# Patient Record
Sex: Male | Born: 1940 | Race: White | Hispanic: No | Smoking: Current every day smoker
Health system: Southern US, Community
[De-identification: ages and names within clinical notes are randomized; demographics above are authoritative.]

## PROBLEM LIST (undated history)

## (undated) DIAGNOSIS — I10 Essential (primary) hypertension: Secondary | ICD-10-CM

## (undated) DIAGNOSIS — R600 Localized edema: Secondary | ICD-10-CM

## (undated) DIAGNOSIS — K2970 Gastritis, unspecified, without bleeding: Secondary | ICD-10-CM

## (undated) DIAGNOSIS — I714 Abdominal aortic aneurysm, without rupture: Secondary | ICD-10-CM

## (undated) DIAGNOSIS — K219 Gastro-esophageal reflux disease without esophagitis: Secondary | ICD-10-CM

## (undated) DIAGNOSIS — E8881 Metabolic syndrome: Secondary | ICD-10-CM

## (undated) DIAGNOSIS — R51 Headache: Secondary | ICD-10-CM

## (undated) DIAGNOSIS — K922 Gastrointestinal hemorrhage, unspecified: Secondary | ICD-10-CM

## (undated) DIAGNOSIS — M159 Polyosteoarthritis, unspecified: Secondary | ICD-10-CM

## (undated) DIAGNOSIS — I82409 Acute embolism and thrombosis of unspecified deep veins of unspecified lower extremity: Secondary | ICD-10-CM

## (undated) DIAGNOSIS — J309 Allergic rhinitis, unspecified: Secondary | ICD-10-CM

## (undated) DIAGNOSIS — E7800 Pure hypercholesterolemia, unspecified: Secondary | ICD-10-CM

## (undated) DIAGNOSIS — K279 Peptic ulcer, site unspecified, unspecified as acute or chronic, without hemorrhage or perforation: Secondary | ICD-10-CM

## (undated) DIAGNOSIS — R0602 Shortness of breath: Secondary | ICD-10-CM

## (undated) DIAGNOSIS — M1990 Unspecified osteoarthritis, unspecified site: Secondary | ICD-10-CM

## (undated) DIAGNOSIS — M109 Gout, unspecified: Secondary | ICD-10-CM

## (undated) HISTORY — PX: FOOT SURGERY: SHX648

## (undated) HISTORY — DX: Pure hypercholesterolemia, unspecified: E78.00

## (undated) HISTORY — PX: APPENDECTOMY: SHX54

## (undated) HISTORY — PX: ABDOMINAL AORTIC ANEURYSM REPAIR: SUR1152

## (undated) HISTORY — DX: Gastrointestinal hemorrhage, unspecified: K92.2

## (undated) HISTORY — DX: Gout, unspecified: M10.9

## (undated) HISTORY — DX: Metabolic syndrome: E88.810

## (undated) HISTORY — DX: Acute embolism and thrombosis of unspecified deep veins of unspecified lower extremity: I82.409

## (undated) HISTORY — DX: Localized edema: R60.0

## (undated) HISTORY — DX: Gastritis, unspecified, without bleeding: K29.70

## (undated) HISTORY — DX: Allergic rhinitis, unspecified: J30.9

## (undated) HISTORY — DX: Metabolic syndrome: E88.81

## (undated) HISTORY — DX: Polyosteoarthritis, unspecified: M15.9

## (undated) HISTORY — PX: OTHER SURGICAL HISTORY: SHX169

## (undated) HISTORY — DX: Peptic ulcer, site unspecified, unspecified as acute or chronic, without hemorrhage or perforation: K27.9

## (undated) HISTORY — PX: TONSILLECTOMY: SUR1361

## (undated) HISTORY — PX: KNEE ARTHROSCOPY: SHX127

## (undated) NOTE — *Deleted (*Deleted)
Family Medicine Teaching Service Daily Progress Note Intern Pager: [phone]  Patient name: Richard Townsend Medical record number: [id number] Date of birth: [birth year] Age: 70 y.o. Gender: male  Primary Care Provider: Philemon Kingdom, MD Consultants: Cardiology, vascular surgery, wound care Code Status: DNR  Pt Overview and Major Events to Date:  Admitted 04/30/2020  Assessment and Plan: Richard Townsend a 67 y.o.malepresenting with bilateral calf and foot swelling. PMH is significant forHFrEF, HTN, CAD, MI, AAA s/p repair, GI bleed, DVT s/p IVC filter 04/2019, hyperlipidemia.   Subacute bilateral lower extremity edema *** . Echo yesterday 11/22 showed EF stable from last echo 30 to 35%, tricuspid and mitral regurg. ABI study 11/22 showed moderate bilateral extremity arterial disease, ABI left 0.69, right 0.70. Vascular surgery following, pt declined offered interventions yesterday.  -Cardiology recommends gentle diuresis with 40 mg IV Lasix twice daily -Wound care consulted, recommendations in place -Follow-up EVAR duplex from vascular -Strict I's and O's -Daily weights -Follow-up a.m. CMP, CBC  HFrEF *** -Cardiology recommendations as above -Echo as above -Continue Imdur and metoprolol -Will consider nitroglycerin in future if need arises -Strict I's and O's -Daily weights  AKI CKD Stage 2 Creatinine worsening, today 1.88. Since admission 1.65 > 1.77 > 1.88. Believe baseline to be 1.7-1.8. Prior to admission, most recent creatinine 1.77 on 11/17. -Given approaching euvolemia, will reduce diuresis -Follow daily creatinine -Consider consult nephro if creatinine worsens  Thrombocytopenia: new, worsening Worsening. This morning, 115. Since admission, 133 > 122 > 115. Prior to admission, last measured 140 on 06/27/2019. Normal baseline thought to be 350-370.  -Follow-up morning CBC, CMP  CAD  hx MI  hx AAA s/p repair DVT s/p IVC Takes atorvastatin 20 mg  daily at home.Patient previously taken off Eliquis after large GI bleed. Will provide DVT prophylaxis in form of Lovenox,adjusted for creatinine clearance. -Continue atorvastatin -DVT prophylaxis with renally dosed Lovenox  Hx GI bleed GERD  Hx stomach ulcer Patient previously taken off Eliquis after large GI bleed. Will provide DVT prophylaxis in form of Lovenox,adjusted for creatinine clearance.Takes famotidine 40 mg, ferrous sulfate 324 mg, Protonix 40 mg twice daily at home.  -Continue home doses Pepcid, p.o. iron, Protonix.  COPD On Symbicort 2 puffs daily and albuterol as needed at home. [facility] inpatient due to formulary -Continue albuterol inhaler as needed  Hyperlipidemia Takes atorvastatin 20 mg daily at home. -Continue home meds  Gout Takes allopurinol 300 mg daily at home. -Continue home meds  Tobacco use Smokes pack a day. -Provide nicotine patch 7 mg daily (had nausea with patches in past) -Will increase as necessary   FEN/GI:Regular diet Prophylaxis:Lovenox   Status is: Observation  The patient remains OBS appropriate and will d/c before 2 midnights.  Dispo: The patient is from: Home              Anticipated d/c is to: Home              Anticipated d/c date is: 1 day              Patient currently is medically stable to d/c.    Subjective:  ***  Objective: Temp:  [97.9 F (36.6 C)-98.8 F (37.1 C)] 98.8 F (37.1 C) (11/23 0414) Pulse Rate:  [83-90] 87 (11/23 0414) Resp:  [16-17] 17 (11/23 0414) BP: (118-148)/(71-91) 125/90 (11/23 0414) SpO2:  [93 %-100 %] 95 % (11/23 0414) Weight:  [69.5 kg] 69.5 kg (11/23 0454) Physical Exam: General: *** Cardiovascular: *** Respiratory: *** Abdomen: ***  Extremities: ***  Laboratory: Recent Labs  Lab 04/30/20 1222 05/01/20 0435 05/02/20 0314  WBC 11.0* 9.8 9.6  HGB 16.9 15.6 15.0  HCT 51.1 47.1 45.5  PLT 133* 122* 115*   Recent Labs  Lab 04/30/20 2146 04/30/20  2146 05/01/20 0435 05/01/20 1434 05/02/20 0314  NA 136   < > 137 134* 135  K 2.9*   < > 2.9* 3.3* 3.9  CL 92*   < > 93* 92* 93*  CO2 28   < > 30 27 30   BUN 63*   < > 60* 62* 60*  CREATININE 1.65*   < > 1.77* 1.88* 1.88*  CALCIUM 9.3   < > 9.1 8.7* 8.7*  PROT 6.3*  --  6.1*  --   --   BILITOT 2.0*  --  2.4*  --   --   ALKPHOS 121  --  120  --   --   ALT 105*  --  103*  --   --   AST 26  --  26  --   --   GLUCOSE 138*   < > 94 175* 116*   < > = values in this interval not displayed.    Imaging/Diagnostic Tests:    Fayette Pho, MD 05/02/2020, 7:13 AM PGY-1, [facility] Family Medicine FPTS Intern pager: [phone], text pages welcome

---

## 2007-06-11 HISTORY — PX: EYE SURGERY: SHX253

## 2007-06-11 HISTORY — PX: OTHER SURGICAL HISTORY: SHX169

## 2011-11-09 DIAGNOSIS — I714 Abdominal aortic aneurysm, without rupture, unspecified: Secondary | ICD-10-CM

## 2011-11-09 HISTORY — DX: Abdominal aortic aneurysm, without rupture: I71.4

## 2011-11-09 HISTORY — DX: Abdominal aortic aneurysm, without rupture, unspecified: I71.40

## 2011-11-27 ENCOUNTER — Encounter (HOSPITAL_COMMUNITY): Payer: Self-pay | Admitting: General Practice

## 2011-11-27 ENCOUNTER — Inpatient Hospital Stay (HOSPITAL_COMMUNITY)
Admission: RE | Admit: 2011-11-27 | Discharge: 2011-11-29 | DRG: 238 | Disposition: A | Discharge status: Home / Self Care | Payer: Medicare PPO | Source: Other Acute Inpatient Hospital | Attending: Surgery | Admitting: Surgery

## 2011-11-27 ENCOUNTER — Encounter (HOSPITAL_COMMUNITY): Payer: Self-pay | Admitting: Anesthesiology

## 2011-11-27 DIAGNOSIS — I714 Abdominal aortic aneurysm, without rupture, unspecified: Secondary | ICD-10-CM

## 2011-11-27 DIAGNOSIS — K219 Gastro-esophageal reflux disease without esophagitis: Secondary | ICD-10-CM | POA: Diagnosis present

## 2011-11-27 DIAGNOSIS — I10 Essential (primary) hypertension: Secondary | ICD-10-CM | POA: Diagnosis present

## 2011-11-27 DIAGNOSIS — F17200 Nicotine dependence, unspecified, uncomplicated: Secondary | ICD-10-CM | POA: Diagnosis present

## 2011-11-27 HISTORY — DX: Gastro-esophageal reflux disease without esophagitis: K21.9

## 2011-11-27 HISTORY — DX: Headache: R51

## 2011-11-27 HISTORY — DX: Abdominal aortic aneurysm, without rupture: I71.4

## 2011-11-27 HISTORY — DX: Essential (primary) hypertension: I10

## 2011-11-27 HISTORY — DX: Unspecified osteoarthritis, unspecified site: M19.90

## 2011-11-27 HISTORY — DX: Shortness of breath: R06.02

## 2011-11-27 LAB — COMPREHENSIVE METABOLIC PANEL
ALT: 13 U/L (ref 0–53)
AST: 8 U/L (ref 0–37)
Albumin: 3.6 g/dL (ref 3.5–5.2)
Alkaline Phosphatase: 85 U/L (ref 39–117)
BUN: 13 mg/dL (ref 6–23)
CO2: 26 mEq/L (ref 19–32)
Calcium: 9 mg/dL (ref 8.4–10.5)
Chloride: 102 mEq/L (ref 96–112)
Creatinine, Ser: 0.84 mg/dL (ref 0.50–1.35)
GFR calc Af Amer: >90 mL/min (ref >90)
GFR calc non Af Amer: 87 mL/min — ABNORMAL LOW (ref >90)
Glucose, Bld: 98 mg/dL (ref 70–99)
Potassium: 3.5 mEq/L (ref 3.5–5.1)
Sodium: 138 mEq/L (ref 135–145)
Total Bilirubin: 0.3 mg/dL (ref 0.3–1.2)
Total Protein: 7.5 g/dL (ref 6.0–8.3)

## 2011-11-27 LAB — CBC
HCT: 42.6 % (ref 39.0–52.0)
Hemoglobin: 14.2 g/dL (ref 13.0–17.0)
MCH: 28.2 pg (ref 26.0–34.0)
MCHC: 33.3 g/dL (ref 30.0–36.0)
MCV: 84.5 fL (ref 78.0–100.0)
Platelets: 231 10*3/uL (ref 150–400)
RBC: 5.04 MIL/uL (ref 4.22–5.81)
RDW: 15.4 % (ref 11.5–15.5)
WBC: 13.2 10*3/uL — ABNORMAL HIGH (ref 4.0–10.5)

## 2011-11-27 LAB — ABO/RH: ABO/RH(D): A POS

## 2011-11-27 LAB — URINALYSIS, ROUTINE W REFLEX MICROSCOPIC
Bilirubin Urine: NEGATIVE
Glucose, UA: NEGATIVE mg/dL
Hgb urine dipstick: NEGATIVE
Ketones, ur: NEGATIVE mg/dL
Leukocytes, UA: NEGATIVE
Nitrite: NEGATIVE
Protein, ur: NEGATIVE mg/dL
Specific Gravity, Urine: >1.046 — ABNORMAL HIGH (ref 1.005–1.030)
Urobilinogen, UA: 0.2 mg/dL (ref 0.0–1.0)
pH: 5.5 (ref 5.0–8.0)

## 2011-11-27 LAB — PROTIME-INR
INR: 0.91 (ref 0.00–1.49)
Prothrombin Time: 12.5 seconds (ref 11.6–15.2)

## 2011-11-27 LAB — PREPARE RBC (CROSSMATCH)

## 2011-11-27 MED ORDER — GUAIFENESIN-DM 100-10 MG/5ML PO SYRP: 15.0000 mL | ORAL_SOLUTION | ORAL | Status: DC | PRN

## 2011-11-27 MED ORDER — HYDROMORPHONE HCL PF 1 MG/ML IJ SOLN
INTRAMUSCULAR | Status: AC
  Administered 2011-11-27: 1 mg via INTRAVENOUS
  Filled 2011-11-27: qty 1

## 2011-11-27 MED ORDER — HYDRALAZINE HCL 20 MG/ML IJ SOLN
10.0000 mg | INTRAMUSCULAR | Status: DC | PRN
  Administered 2011-11-28: 10 mg via INTRAVENOUS

## 2011-11-27 MED ORDER — ONDANSETRON HCL 4 MG/2ML IJ SOLN
4.0000 mg | Freq: Four times a day (QID) | INTRAMUSCULAR | Status: DC | PRN
  Administered 2011-11-27: 4 mg via INTRAVENOUS
  Filled 2011-11-27: qty 2

## 2011-11-27 MED ORDER — ALPRAZOLAM 0.5 MG PO TABS: 0.5000 mg | ORAL_TABLET | Freq: Three times a day (TID) | ORAL | Status: DC | PRN

## 2011-11-27 MED ORDER — METOPROLOL TARTRATE 1 MG/ML IV SOLN: 2.0000 mg | INTRAVENOUS | Status: DC | PRN

## 2011-11-27 MED ORDER — POTASSIUM CHLORIDE CRYS ER 20 MEQ PO TBCR: 20.0000 meq | EXTENDED_RELEASE_TABLET | Freq: Once | ORAL | Status: DC

## 2011-11-27 MED ORDER — MORPHINE SULFATE 2 MG/ML IJ SOLN
2.0000 mg | INTRAMUSCULAR | Status: DC | PRN
  Administered 2011-11-27: 4 mg via INTRAVENOUS
  Administered 2011-11-27 (×2): 2 mg via INTRAVENOUS
  Filled 2011-11-27: qty 2
  Filled 2011-11-27: qty 1
  Filled 2011-11-27: qty 2

## 2011-11-27 MED ORDER — PANTOPRAZOLE SODIUM 40 MG PO TBEC: 40.0000 mg | DELAYED_RELEASE_TABLET | Freq: Every day | ORAL | Status: DC

## 2011-11-27 MED ORDER — SODIUM CHLORIDE 0.9 % IV SOLN
INTRAVENOUS | Status: DC
  Administered 2011-11-27 – 2011-11-28 (×3): via INTRAVENOUS

## 2011-11-27 MED ORDER — HYDROMORPHONE HCL PF 1 MG/ML IJ SOLN
1.0000 mg | INTRAMUSCULAR | Status: DC | PRN
  Administered 2011-11-27 – 2011-11-28 (×10): 1 mg via INTRAVENOUS
  Filled 2011-11-27 (×10): qty 1

## 2011-11-27 MED ORDER — IRBESARTAN 300 MG PO TABS
300.0000 mg | ORAL_TABLET | Freq: Every day | ORAL | Status: DC
  Administered 2011-11-27: 300 mg via ORAL
  Filled 2011-11-27 (×2): qty 1

## 2011-11-27 MED ORDER — LABETALOL HCL 5 MG/ML IV SOLN: 10.0000 mg | INTRAVENOUS | Status: DC | PRN

## 2011-11-27 MED ORDER — PHENOL 1.4 % MT LIQD: 1.0000 | OROMUCOSAL | Status: DC | PRN

## 2011-11-27 MED ORDER — ALPRAZOLAM 0.5 MG PO TABS: 0.5000 mg | ORAL_TABLET | Freq: Four times a day (QID) | ORAL | Status: DC | PRN

## 2011-11-27 MED ORDER — CEFAZOLIN SODIUM 1-5 GM-% IV SOLN
1.0000 g | INTRAVENOUS | Status: DC
Start: 2011-11-28 — End: 2011-11-28
  Filled 2011-11-27: qty 50

## 2011-11-27 MED ORDER — ALUM & MAG HYDROXIDE-SIMETH 200-200-20 MG/5ML PO SUSP: 15.0000 mL | ORAL | Status: DC | PRN

## 2011-11-27 NOTE — H&P (Signed)
[facility] [place]      History and Physical  Patient name: Richard Townsend MRN: [id number] DOB: 1940-10-02 Sex: male   Reason for Admission: No chief complaint on file.   HISTORY OF PRESENT ILLNESS: This is a 71 year old gentleman who initially presented to the [facility] emergency department with a four-day history of abdominal pain which radiated to his groin. He was initially treated with pain medication with the thoughts that this was a kidney stone since he has had multiple kidney stones in the past. Last night the pain had not resolved and therefore he went to the emergency department where a CT scan was obtained. The CT scan reveals a 5.8 cm abdominal aortic aneurysm. The patient was therefore transferred to [place] for further management. Upon arrival the patient states that his pain has subsided somewhat.  The patient denies a history of diabetes or hypercholesterolemia. He does have hypertension and has been a long-time smoker. He is status post cholecystectomy and umbilical hernia repair done simultaneously via a laparoscopic approach.    No past medical history on file.  No past surgical history on file.  History   Social History  . Marital Status: N/A    Spouse Name: N/A    Number of Children: N/A  . Years of Education: N/A   Occupational History  . Not on file.   Social History Main Topics  . Smoking status: Not on file  . Smokeless tobacco: Not on file  . Alcohol Use: Not on file  . Drug Use: Not on file  . Sexually Active: Not on file   Other Topics Concern  . Not on file   Social History Narrative  . No narrative on file    No family history on file.  Allergies as of 11/27/2011  . (Not on File)    No current facility-administered medications on file prior to encounter.   No current outpatient prescriptions on file prior to encounter.     REVIEW OF SYSTEMS: Cardiovascular: No chest pain, chest pressure, palpitations,  orthopnea, or dyspnea on exertion. No claudication or rest pain,  No history of DVT or phlebitis. Pulmonary: No productive cough, asthma or wheezing. Neurologic: No weakness, paresthesias, aphasia, or amaurosis. No dizziness. Hematologic: No bleeding problems or clotting disorders. Musculoskeletal: No joint pain or joint swelling. Gastrointestinal: No blood in stool or hematemesis Genitourinary: No dysuria or hematuria. Psychiatric:: No history of major depression. Integumentary: No rashes or ulcers. Constitutional: No fever or chills.  PHYSICAL EXAMINATION: General: The patient appears their stated age.  Vital signs are BP 165/81  Pulse 67  Temp 98.6 F (37 C) (Oral)  Resp 19  Ht 5\' 10"  (1.778 m)  Wt 198 lb 3.1 oz (89.9 kg)  BMI 28.44 kg/m2  SpO2 93% HEENT:  No gross abnormalities Pulmonary: Respirations are non-labored Abdomen: His abdomen is somewhat obese. His pain is in the suprapubic region. Upon deep palpation of his aorta, he has very minimal if no discomfort. Musculoskeletal: There are no major deformities.   Neurologic: No focal weakness or paresthesias are detected, Skin: There are no ulcer or rashes noted. Psychiatric: The patient has normal affect. Cardiovascular: There is a regular rate and rhythm without significant murmur appreciated. Pedal pulses are not palpated. Femoral pulses are palpable.  Diagnostic Studies: I have reviewed his CT angiogram which reveals a 5.8 cm infrarenal abdominal aortic aneurysm which is possibly inflammatory.    Assessment:  Possible symptomatic abdominal aortic aneurysm. Plan: I discussed with  the patient and his family at the bedside the significance of the radiographic findings which revealed a large abdominal aortic aneurysm. I told him that I am not convinced that his pain is coming from his aneurysm but this is the one problem that could potentially be life-threatening and therefore it needs to be addressed. I believe he will be  a candidate for endovascular repair. I discussed the risks and benefits of the procedure which include the risk of death, bleeding, cardiopulmonary complications, intestinal ischemia, lower extremity ischemia, and the potential need for recurrent intervention and long-term surveillance. All of his questions were answered. I will plan on proceeding tomorrow morning. Today I will get baseline studies of his carotid and lower extremities.     Richard Townsend, M.D. [facility] [place] Office: [phone] Pager:  [phone]

## 2011-11-27 NOTE — Progress Notes (Signed)
Pt having continued pain unresolved by 4mg  morphine and very restless, getting up and standing at side of bed.  Della Goo PA notified. New orders received for pain and anxiety medication.    Roselie Awkward, RN

## 2011-11-27 NOTE — Progress Notes (Signed)
Utilization review completed.  

## 2011-11-27 NOTE — Care Management Note (Signed)
    Page 1 of 1   11/29/2011     11:49:43 AM   CARE MANAGEMENT NOTE 11/29/2011  Patient:  Richard Townsend, Richard Townsend   Account Number:  [id number]  Date Initiated:  11/27/2011  Documentation initiated by:  Donn Pierini  Subjective/Objective Assessment:   Pt admitted with AAA- pending OR for repair on Thur.     Action/Plan:   PTA pt lived at home with spouse, independent   Anticipated DC Date:  11/30/2011   Anticipated DC Plan:  HOME/SELF CARE      DC Planning Services  CM consult      Choice offered to / List presented to:             Status of service:  Completed, signed off Medicare Important Message given?   (If response is "NO", the following Medicare IM given date fields will be blank) Date Medicare IM given:   Date Additional Medicare IM given:    Discharge Disposition:  HOME/SELF CARE  Per UR Regulation:  Reviewed for med. necessity/level of care/duration of stay  If discussed at Long Length of Stay Meetings, dates discussed:    Comments:  11/29/11- 1145- Donn Pierini RN, BSN [phone] Pt s/p AAA repair, discharged home today with spouse, no d/c needs post op   11/27/11- 1130- Donn Pierini RN, BSN [phone] Spoke with pt and family at bedside (wife- Patsy, son- Education officer, community) per conversation pt lives at home with spouse is independent and does not use any DME. Pt states that he does have insurance- [employer] and has prescription coverage for medications- uses [facility] for medications. Family to bring insurance card and take to admissions. Pt for AAA repair. NCM to follow for any potential d/c needs. Plan is to return home with family.

## 2011-11-28 ENCOUNTER — Inpatient Hospital Stay (HOSPITAL_COMMUNITY): Payer: Medicare PPO

## 2011-11-28 ENCOUNTER — Inpatient Hospital Stay (HOSPITAL_COMMUNITY): Payer: Medicare PPO | Admitting: Anesthesiology

## 2011-11-28 ENCOUNTER — Encounter (HOSPITAL_COMMUNITY)
Admission: RE | Disposition: A | Discharge status: Home / Self Care | Payer: Self-pay | Source: Other Acute Inpatient Hospital | Attending: Surgery

## 2011-11-28 ENCOUNTER — Encounter (HOSPITAL_COMMUNITY): Payer: Self-pay | Admitting: Anesthesiology

## 2011-11-28 HISTORY — PX: ABDOMINAL AORTIC ANEURYSM REPAIR: SHX42

## 2011-11-28 LAB — POCT I-STAT 7, (LYTES, BLD GAS, ICA,H+H)
Bicarbonate: 26.4 mEq/L — ABNORMAL HIGH (ref 20.0–24.0)
Calcium, Ion: 1.23 mmol/L (ref 1.12–1.32)
HCT: 29 % — ABNORMAL LOW (ref 39.0–52.0)
Hemoglobin: 9.9 g/dL — ABNORMAL LOW (ref 13.0–17.0)
O2 Saturation: 100 %
Patient temperature: 35.9
Potassium: 4.1 mEq/L (ref 3.5–5.1)
Sodium: 140 mEq/L (ref 135–145)
TCO2: 28 mmol/L (ref 0–100)
pCO2 arterial: 45.8 mmHg — ABNORMAL HIGH (ref 35.0–45.0)
pH, Arterial: 7.364 (ref 7.350–7.450)
pO2, Arterial: 257 mmHg — ABNORMAL HIGH (ref 80.0–100.0)

## 2011-11-28 LAB — CBC
HCT: 36.8 % — ABNORMAL LOW (ref 39.0–52.0)
Hemoglobin: 12 g/dL — ABNORMAL LOW (ref 13.0–17.0)
MCH: 27.6 pg (ref 26.0–34.0)
MCHC: 32.6 g/dL (ref 30.0–36.0)
MCV: 84.8 fL (ref 78.0–100.0)
Platelets: 162 10*3/uL (ref 150–400)
RBC: 4.34 MIL/uL (ref 4.22–5.81)
RDW: 15.4 % (ref 11.5–15.5)
WBC: 12.5 10*3/uL — ABNORMAL HIGH (ref 4.0–10.5)

## 2011-11-28 LAB — PROTIME-INR
INR: 1.06 (ref 0.00–1.49)
Prothrombin Time: 14 seconds (ref 11.6–15.2)

## 2011-11-28 LAB — BASIC METABOLIC PANEL
BUN: 8 mg/dL (ref 6–23)
CO2: 26 mEq/L (ref 19–32)
Calcium: 8.3 mg/dL — ABNORMAL LOW (ref 8.4–10.5)
Chloride: 102 mEq/L (ref 96–112)
Creatinine, Ser: 0.75 mg/dL (ref 0.50–1.35)
GFR calc Af Amer: >90 mL/min (ref >90)
GFR calc non Af Amer: >90 mL/min (ref >90)
Glucose, Bld: 106 mg/dL — ABNORMAL HIGH (ref 70–99)
Potassium: 3.3 mEq/L — ABNORMAL LOW (ref 3.5–5.1)
Sodium: 136 mEq/L (ref 135–145)

## 2011-11-28 LAB — APTT: aPTT: 32 seconds (ref 24–37)

## 2011-11-28 LAB — MAGNESIUM: Magnesium: 1.7 mg/dL (ref 1.5–2.5)

## 2011-11-28 SURGERY — INSERTION, ENDOVASCULAR STENT GRAFT, AORTA, ABDOMINAL
Anesthesia: General | Wound class: Clean

## 2011-11-28 MED ORDER — METOPROLOL TARTRATE 1 MG/ML IV SOLN: 2.0000 mg | INTRAVENOUS | Status: DC | PRN

## 2011-11-28 MED ORDER — ACETAMINOPHEN 325 MG PO TABS: 325.0000 mg | ORAL_TABLET | ORAL | Status: DC | PRN

## 2011-11-28 MED ORDER — PHENOL 1.4 % MT LIQD: 1.0000 | OROMUCOSAL | Status: DC | PRN

## 2011-11-28 MED ORDER — GUAIFENESIN-DM 100-10 MG/5ML PO SYRP: 15.0000 mL | ORAL_SOLUTION | ORAL | Status: DC | PRN

## 2011-11-28 MED ORDER — LABETALOL HCL 5 MG/ML IV SOLN
INTRAVENOUS | Status: DC | PRN
  Administered 2011-11-28: 2.5 mg via INTRAVENOUS

## 2011-11-28 MED ORDER — SUCCINYLCHOLINE CHLORIDE 20 MG/ML IJ SOLN
INTRAMUSCULAR | Status: DC | PRN
  Administered 2011-11-28: 100 mg via INTRAVENOUS

## 2011-11-28 MED ORDER — AMLODIPINE BESYLATE 5 MG PO TABS
5.0000 mg | ORAL_TABLET | Freq: Every morning | ORAL | Status: DC
  Administered 2011-11-28: 5 mg via ORAL
  Filled 2011-11-28 (×2): qty 1

## 2011-11-28 MED ORDER — LIDOCAINE HCL (CARDIAC) 20 MG/ML IV SOLN
INTRAVENOUS | Status: DC | PRN
  Administered 2011-11-28: 60 mg via INTRAVENOUS

## 2011-11-28 MED ORDER — NEOSTIGMINE METHYLSULFATE 1 MG/ML IJ SOLN
INTRAMUSCULAR | Status: DC | PRN
  Administered 2011-11-28: 5 mg via INTRAVENOUS

## 2011-11-28 MED ORDER — SODIUM CHLORIDE 0.9 % IV SOLN: 500.0000 mL | Freq: Once | INTRAVENOUS | Status: AC | PRN

## 2011-11-28 MED ORDER — OXYCODONE HCL 5 MG PO TABS: 5.0000 mg | ORAL_TABLET | ORAL | Status: DC | PRN

## 2011-11-28 MED ORDER — LACTATED RINGERS IV SOLN
INTRAVENOUS | Status: DC | PRN
  Administered 2011-11-28 (×3): via INTRAVENOUS

## 2011-11-28 MED ORDER — HEPARIN SODIUM (PORCINE) 1000 UNIT/ML IJ SOLN
INTRAMUSCULAR | Status: DC | PRN
  Administered 2011-11-28: 7000 [IU] via INTRAVENOUS
  Administered 2011-11-28: 1000 [IU] via INTRAVENOUS

## 2011-11-28 MED ORDER — DOCUSATE SODIUM 100 MG PO CAPS: 100.0000 mg | ORAL_CAPSULE | Freq: Every day | ORAL | Status: DC

## 2011-11-28 MED ORDER — MAGNESIUM SULFATE 40 MG/ML IJ SOLN
2.0000 g | Freq: Once | INTRAMUSCULAR | Status: AC | PRN
  Filled 2011-11-28: qty 50

## 2011-11-28 MED ORDER — ONDANSETRON HCL 4 MG/2ML IJ SOLN: 4.0000 mg | Freq: Once | INTRAMUSCULAR | Status: DC | PRN

## 2011-11-28 MED ORDER — CEFAZOLIN SODIUM 1-5 GM-% IV SOLN: INTRAVENOUS | Status: DC | PRN

## 2011-11-28 MED ORDER — FENTANYL CITRATE 0.05 MG/ML IJ SOLN
INTRAMUSCULAR | Status: DC | PRN
  Administered 2011-11-28: 150 ug via INTRAVENOUS
  Administered 2011-11-28 (×2): 100 ug via INTRAVENOUS

## 2011-11-28 MED ORDER — SODIUM CHLORIDE 0.9 % IV SOLN
INTRAVENOUS | Status: DC
  Administered 2011-11-28 – 2011-11-29 (×2): via INTRAVENOUS

## 2011-11-28 MED ORDER — GLYCOPYRROLATE 0.2 MG/ML IJ SOLN
INTRAMUSCULAR | Status: DC | PRN
  Administered 2011-11-28: .7 mg via INTRAVENOUS

## 2011-11-28 MED ORDER — ONDANSETRON HCL 4 MG/2ML IJ SOLN: 4.0000 mg | Freq: Four times a day (QID) | INTRAMUSCULAR | Status: DC | PRN

## 2011-11-28 MED ORDER — ALUM & MAG HYDROXIDE-SIMETH 200-200-20 MG/5ML PO SUSP: 15.0000 mL | ORAL | Status: DC | PRN

## 2011-11-28 MED ORDER — DOPAMINE-DEXTROSE 3.2-5 MG/ML-% IV SOLN: 3.0000 ug/kg/min | INTRAVENOUS | Status: DC

## 2011-11-28 MED ORDER — DEXTROSE 5 % IV SOLN
1.5000 g | Freq: Two times a day (BID) | INTRAVENOUS | Status: AC
  Administered 2011-11-28 – 2011-11-29 (×2): 1.5 g via INTRAVENOUS
  Filled 2011-11-28 (×5): qty 1.5

## 2011-11-28 MED ORDER — HYDROMORPHONE HCL PF 1 MG/ML IJ SOLN
0.2500 mg | INTRAMUSCULAR | Status: DC | PRN
  Administered 2011-11-28 (×2): 0.5 mg via INTRAVENOUS

## 2011-11-28 MED ORDER — HYDRALAZINE HCL 20 MG/ML IJ SOLN: 10.0000 mg | INTRAMUSCULAR | Status: DC | PRN

## 2011-11-28 MED ORDER — HYDRALAZINE HCL 20 MG/ML IJ SOLN
INTRAMUSCULAR | Status: AC
  Filled 2011-11-28: qty 1

## 2011-11-28 MED ORDER — SODIUM CHLORIDE 0.9 % IR SOLN
Status: DC | PRN
  Administered 2011-11-28: 09:00:00

## 2011-11-28 MED ORDER — POTASSIUM CHLORIDE CRYS ER 20 MEQ PO TBCR: 20.0000 meq | EXTENDED_RELEASE_TABLET | Freq: Once | ORAL | Status: AC | PRN

## 2011-11-28 MED ORDER — CEFAZOLIN SODIUM 1-5 GM-% IV SOLN
INTRAVENOUS | Status: DC | PRN
  Administered 2011-11-28: 1 g via INTRAVENOUS

## 2011-11-28 MED ORDER — PROTAMINE SULFATE 10 MG/ML IV SOLN: INTRAVENOUS | Status: DC | PRN

## 2011-11-28 MED ORDER — VECURONIUM BROMIDE 10 MG IV SOLR
INTRAVENOUS | Status: DC | PRN
  Administered 2011-11-28 (×2): 5 mg via INTRAVENOUS
  Administered 2011-11-28: 2 mg via INTRAVENOUS
  Administered 2011-11-28: 1 mg via INTRAVENOUS

## 2011-11-28 MED ORDER — MORPHINE SULFATE 2 MG/ML IJ SOLN: 2.0000 mg | INTRAMUSCULAR | Status: DC | PRN

## 2011-11-28 MED ORDER — SODIUM CHLORIDE 0.9 % IJ SOLN
INTRAVENOUS | Status: DC | PRN
  Administered 2011-11-28: 11:00:00 via INTRAMUSCULAR

## 2011-11-28 MED ORDER — ONDANSETRON HCL 4 MG/2ML IJ SOLN
INTRAMUSCULAR | Status: DC | PRN
  Administered 2011-11-28 (×2): 4 mg via INTRAVENOUS

## 2011-11-28 MED ORDER — MILK AND MOLASSES ENEMA
Freq: Once | RECTAL | Status: AC
  Administered 2011-11-28: 22:00:00 via RECTAL
  Filled 2011-11-28: qty 250

## 2011-11-28 MED ORDER — PROPOFOL 10 MG/ML IV EMUL
INTRAVENOUS | Status: DC | PRN
  Administered 2011-11-28: 100 mg via INTRAVENOUS
  Administered 2011-11-28: 50 mg via INTRAVENOUS

## 2011-11-28 MED ORDER — HYDROMORPHONE HCL PF 1 MG/ML IJ SOLN
1.0000 mg | INTRAMUSCULAR | Status: DC | PRN
  Administered 2011-11-28 (×4): 1 mg via INTRAVENOUS
  Filled 2011-11-28 (×4): qty 1

## 2011-11-28 MED ORDER — LABETALOL HCL 5 MG/ML IV SOLN: 10.0000 mg | INTRAVENOUS | Status: DC | PRN

## 2011-11-28 MED ORDER — IODIXANOL 320 MG/ML IV SOLN
INTRAVENOUS | Status: DC | PRN
  Administered 2011-11-28: 10 mL via INTRA_ARTERIAL

## 2011-11-28 MED ORDER — PROTAMINE SULFATE 10 MG/ML IV SOLN
INTRAVENOUS | Status: DC | PRN
  Administered 2011-11-28: 50 mg via INTRAVENOUS

## 2011-11-28 MED ORDER — PANTOPRAZOLE SODIUM 40 MG PO TBEC
40.0000 mg | DELAYED_RELEASE_TABLET | Freq: Every day | ORAL | Status: DC
  Administered 2011-11-28: 40 mg via ORAL
  Filled 2011-11-28: qty 1

## 2011-11-28 MED ORDER — IRBESARTAN 300 MG PO TABS
300.0000 mg | ORAL_TABLET | Freq: Every day | ORAL | Status: DC
  Administered 2011-11-28: 300 mg via ORAL
  Filled 2011-11-28 (×2): qty 1

## 2011-11-28 MED ORDER — ACETAMINOPHEN 650 MG RE SUPP: 325.0000 mg | RECTAL | Status: DC | PRN

## 2011-11-28 SURGICAL SUPPLY — 82 items
BAG DECANTER FOR FLEXI CONT (MISCELLANEOUS) ×2 IMPLANT
BAG SNAP BAND KOVER 36X36 (MISCELLANEOUS) ×8 IMPLANT
BALLN CODA OCL 2-9.0-35-120-3 (BALLOONS)
BALLOON COD OCL 2-9.0-35-120-3 (BALLOONS) IMPLANT
BLADE SURG CLIPPER 3M 9600 (MISCELLANEOUS) ×2 IMPLANT
CANISTER SUCTION 2500CC (MISCELLANEOUS) ×2 IMPLANT
CLIP TI MEDIUM 24 (CLIP) IMPLANT
CLIP TI WIDE RED SMALL 24 (CLIP) IMPLANT
CLOTH BEACON ORANGE TIMEOUT ST (SAFETY) ×2 IMPLANT
COVER MAYO STAND STRL (DRAPES) ×2 IMPLANT
COVER SURGICAL LIGHT HANDLE (MISCELLANEOUS) ×4 IMPLANT
DERMABOND ADVANCED (GAUZE/BANDAGES/DRESSINGS) ×2
DERMABOND ADVANCED .7 DNX12 (GAUZE/BANDAGES/DRESSINGS) ×2 IMPLANT
DEVICE CLOSURE PERCLS PRGLD 6F (VASCULAR PRODUCTS) ×4 IMPLANT
DEVICE TORQUE 50000 (MISCELLANEOUS) IMPLANT
DRAIN CHANNEL 10F 3/8 F FF (DRAIN) IMPLANT
DRAPE TABLE COVER HEAVY DUTY (DRAPES) ×2 IMPLANT
DRESSING OPSITE X SMALL 2X3 (GAUZE/BANDAGES/DRESSINGS) ×4 IMPLANT
DRYSEAL FLEXSHEATH 12FR 33CM (SHEATH) ×1
DRYSEAL FLEXSHEATH 18FR 33CM (SHEATH) ×1
ELECT REM PT RETURN 9FT ADLT (ELECTROSURGICAL) ×4
ELECTRODE REM PT RTRN 9FT ADLT (ELECTROSURGICAL) ×2 IMPLANT
EVACUATOR 3/16  PVC DRAIN (DRAIN)
EVACUATOR 3/16 PVC DRAIN (DRAIN) IMPLANT
EVACUATOR SILICONE 100CC (DRAIN) IMPLANT
EXCLUDER TNK LEG 35MX14X14 (Endovascular Graft) ×1 IMPLANT
EXCLUDER TRUNK LEG 35MX14X14 (Endovascular Graft) ×2 IMPLANT
GLOVE BIO SURGEON STRL SZ 6.5 (GLOVE) ×4 IMPLANT
GLOVE BIOGEL PI IND STRL 7.5 (GLOVE) ×2 IMPLANT
GLOVE BIOGEL PI INDICATOR 7.5 (GLOVE) ×2
GLOVE SS BIOGEL STRL SZ 6.5 (GLOVE) ×1 IMPLANT
GLOVE SS BIOGEL STRL SZ 7 (GLOVE) ×1 IMPLANT
GLOVE SUPERSENSE BIOGEL SZ 6.5 (GLOVE) ×1
GLOVE SUPERSENSE BIOGEL SZ 7 (GLOVE) ×1
GLOVE SURG SS PI 7.5 STRL IVOR (GLOVE) ×2 IMPLANT
GOWN PREVENTION PLUS LG XLONG (DISPOSABLE) ×2 IMPLANT
GOWN PREVENTION PLUS XXLARGE (GOWN DISPOSABLE) ×2 IMPLANT
GOWN STRL NON-REIN LRG LVL3 (GOWN DISPOSABLE) ×8 IMPLANT
GRAFT AORTIC EXTENDER 36MX4.5C (Endovascular Graft) ×2 IMPLANT
GRAFT BALLN CATH 65CM (STENTS) ×1 IMPLANT
GUIDEWIRE AMPLATZ STIFF 0.35 (WIRE) ×4 IMPLANT
HEMOSTAT SNOW SURGICEL 2X4 (HEMOSTASIS) IMPLANT
HEMOSTAT SURGICEL 2X14 (HEMOSTASIS) IMPLANT
KIT BASIN OR (CUSTOM PROCEDURE TRAY) ×2 IMPLANT
KIT ROOM TURNOVER OR (KITS) ×2 IMPLANT
LEG CONTRALATERAL 16X20X13.5 (Vascular Products) ×1 IMPLANT
LEG CONTRALATERAL 16X20X9.5 (Endovascular Graft) ×1 IMPLANT
NEEDLE PERC 18GX7CM (NEEDLE) ×2 IMPLANT
NS IRRIG 1000ML POUR BTL (IV SOLUTION) ×2 IMPLANT
PACK AORTA (CUSTOM PROCEDURE TRAY) ×2 IMPLANT
PAD ARMBOARD 7.5X6 YLW CONV (MISCELLANEOUS) ×4 IMPLANT
PENCIL BUTTON HOLSTER BLD 10FT (ELECTRODE) IMPLANT
PERCLOSE PROGLIDE 6F (VASCULAR PRODUCTS) ×8
SHEATH AVANTI 11CM 8FR (MISCELLANEOUS) ×4 IMPLANT
SHEATH BRITE TIP 8FR 23CM (MISCELLANEOUS) ×2 IMPLANT
SHEATH DRYSEAL FLEX 12FR 33CM (SHEATH) ×1 IMPLANT
SHEATH DRYSEAL FLEX 18FR 33CM (SHEATH) ×1 IMPLANT
SNAP KAP ×2 IMPLANT
STAPLER VISISTAT 35W (STAPLE) IMPLANT
STENT GRAFT BALLN CATH 65CM (STENTS) ×1
STENT GRAFT CONTRALAT 16X20X9. (Endovascular Graft) ×1 IMPLANT
STENT GRAFT CONTRALAT 20X13.5 (Vascular Products) ×1 IMPLANT
STOPCOCK MORSE 400PSI 3WAY (MISCELLANEOUS) ×2 IMPLANT
SUT ETHILON 3 0 PS 1 (SUTURE) IMPLANT
SUT PROLENE 5 0 C 1 24 (SUTURE) IMPLANT
SUT VIC AB 2-0 CT1 36 (SUTURE) IMPLANT
SUT VIC AB 3-0 SH 27 (SUTURE)
SUT VIC AB 3-0 SH 27X BRD (SUTURE) IMPLANT
SUT VICRYL 4-0 PS2 18IN ABS (SUTURE) ×4 IMPLANT
SYR 20CC LL (SYRINGE) ×4 IMPLANT
SYR 30ML LL (SYRINGE) IMPLANT
SYR 5ML LL (SYRINGE) IMPLANT
SYR MEDRAD MARK V 150ML (SYRINGE) ×2 IMPLANT
SYRINGE 10CC LL (SYRINGE) ×6 IMPLANT
TOWEL OR 17X24 6PK STRL BLUE (TOWEL DISPOSABLE) ×4 IMPLANT
TOWEL OR 17X26 10 PK STRL BLUE (TOWEL DISPOSABLE) ×4 IMPLANT
TRAY FOLEY CATH 14FRSI W/METER (CATHETERS) ×2 IMPLANT
TUBING HIGH PRESSURE 120CM (CONNECTOR) ×2 IMPLANT
VANSCHIE 5 BEACON TIP CATHETER ×2 IMPLANT
WATER STERILE IRR 1000ML POUR (IV SOLUTION) ×2 IMPLANT
WIRE BENTSON .035X145CM (WIRE) ×4 IMPLANT
WIRE LUNDERQUIST .035X180CM (WIRE) ×2 IMPLANT

## 2011-11-28 NOTE — Interval H&P Note (Signed)
History and Physical Interval Note:  11/28/2011 7:30 AM  Richard Townsend  has presented today for surgery, with the diagnosis of AAA  The various methods of treatment have been discussed with the patient and family. After consideration of risks, benefits and other options for treatment, the patient has consented to  Procedure(s) (LRB): ABDOMINAL AORTIC ENDOVASCULAR STENT GRAFT (N/A) as a surgical intervention .  The patient's history has been reviewed, patient examined, no change in status, stable for surgery.  I have reviewed the patients' chart and labs.  Questions were answered to the patient's satisfaction.     Zoejane Gaulin IV, V. WELLS

## 2011-11-28 NOTE — Transfer of Care (Signed)
Immediate Anesthesia Transfer of Care Note  Patient: Richard Townsend  Procedure(s) Performed: Procedure(s) (LRB): ABDOMINAL AORTIC ENDOVASCULAR STENT GRAFT (N/A)  Patient Location: PACU  Anesthesia Type: General  Level of Consciousness: awake, alert  and oriented  Airway & Oxygen Therapy: Patient Spontanous Breathing and Patient connected to face mask oxygen  Post-op Assessment: Report given to PACU RN, Post -op Vital signs reviewed and stable and Patient moving all extremities X 4  Post vital signs: Reviewed and stable  Complications: No apparent anesthesia complications

## 2011-11-28 NOTE — Anesthesia Procedure Notes (Signed)
Procedure Name: Intubation Date/Time: 11/28/2011 7:55 AM Performed by: Carmela Rima Pre-anesthesia Checklist: Patient identified, Timeout performed, Emergency Drugs available, Suction available and Patient being monitored Patient Re-evaluated:Patient Re-evaluated prior to inductionOxygen Delivery Method: Circle system utilized Preoxygenation: Pre-oxygenation with 100% oxygen Intubation Type: IV induction and Rapid sequence Laryngoscope Size: Mac and 3 Grade View: Grade I Tube type: Oral Tube size: 7.5 mm Number of attempts: 1 Airway Equipment and Method: Stylet Placement Confirmation: ETT inserted through vocal cords under direct vision,  breath sounds checked- equal and bilateral and positive ETCO2 Secured at: 23 cm Tube secured with: Tape Dental Injury: Teeth and Oropharynx as per pre-operative assessment

## 2011-11-28 NOTE — Op Note (Signed)
[facility]   Patient name: MALACHAI SCHALK MRN: [id number] DOB: [birth year] Sex: male  11/27/2011 - 11/28/2011 Pre-operative Diagnosis: Symptomatic abdominal aortic aneurysm Post-operative diagnosis:  Same Surgeon:  Jorge Ny Assistants:  Lianne Cure Procedure:   Bilateral ultrasound-guided percutaneous access   Catheter in aorta x2   Endovascular repair of abdominal aortic aneurysm   Abdominal aortogram   Distal extension x1   Proximal extension x1 Devices used:           Main body was Gore Excluder (primary right) 35 x 14 x 14    Contralateral limb was a Gore Excluder 20 x 13.5    Ipsilateral distal extension was a Gore Excluder 20 x 9.5    Proximal extension was a Gore Excluder 36 mm cuff Anesthesia:  Gen. Blood Loss:  See anesthesia record Specimens:  None  Findings:  Complete exclusion, poor perfusion of left renal artery  Indications:  The patient was admitted yesterday from [facility]. He has a four-day history of severe abdominal pain. A CT scan revealed a 5.8 cm abdominal aortic aneurysm. He is here today for endovascular repair. The risks and benefits were discussed with the patient and his family.  Procedure:  The patient was identified in the holding area and taken to [facility] OR ROOM 16  The patient was then placed supine on the table. general anesthesia was administered.  The patient was prepped and draped in the usual sterile fashion.  A time out was called and antibiotics were administered.  Ultrasound was used to evaluate bilateral common femoral arteries which were widely patent with mild calcific changes. Digital ultrasound images were acquired. An 11 blade was used to make a skin nick bilaterally. Bilateral femoral arteries were accessed under ultrasound guidance using an 18-gauge needle. An 035 wires were advanced with some resistance due to the tortuosity of the iliac vessels. I placed pro-glide devices at the 11:00 and 1:00  position on both sides for pre-closure. 8 French sheaths were then placed bilaterally. The patient was fully heparinized. I used a Lunderquist wire on the left side and a Amplatz superstiff wire on the right, which was the primary site. An omni-flush catheter was advanced up the left side and placed at the level of L1. I upsized to an 68 French dry seal sheath on the right. The main body was prepared on the back table and loaded to the right sheath. This was a Biomedical scientist 35 x 14 x 14. An abdominal aortogram was performed to delineate the location of the renal arteries. The device was then deployed down to the gate. Next I used a Vanchee catheter to cannulate the contralateral K. with a Benson wire. The cath was able to be freely rotated within the main body to confirm successful cannulation. An Amplatz superstiff wire was then placed. The image detector was pulled down to a right anterior oblique position and a retrograde pelvic antegrade was performed locating the left hypogastric artery. I then upsized to a 12 French sheath on the left. The contralateral limb was prepared back table. This was a Biomedical scientist 20 x 13.5. There was advanced through the sheath. The sheath was withdrawn and the device was deployed landing at the level of the left hypogastric artery. Next the remaining portion of the ipsilateral M. was deployed. The device was then withdrawn. A retrograde sheath injection was performed with the image detector and a left anterior oblique position. This located the right hypogastric artery.  A 20 x 9.5 device was selected. This was placed into the appropriate position and then deployed. A q. 45 balloon was used to mold the proximal and distal portions of the graft as well as device overlap. A completion arteriogram was performed which showed no evidence of endoleak. There was filling of both kidneys however the left renal artery was somewhat difficult to evaluate. There was no evidence of endoleak. Both  hypogastric arteries remained patent. I felt that the device had migrated and distally on appointment. I was unable to advance it proximally and plan to place a cough. A 36 mm cuff was advanced up the right leg and then deployed. It was molded with the q. 45 balloon. A completion angiogram was then performed which showed again complete exclusion with no evidence of a type I leak. There was opacification of the left kidney however again the left renal artery was difficult to visualize. I did not feel any further intervention would improve patency through the left renal artery and elected to terminate the procedure. Catheters and wires were removed. Each groin was closed by cinching down the pro-glide were placed for pre-closure. The patient's heparin was reversed with protamine. Once hemostasis was satisfactory the pro-glide devices were cut and the skin was closed with 4-0 Vicryl. Dermabond placed on the wound. Patient had Doppler signals in both posterior tibial arteries after the procedure.   Disposition:  To PACU in stable condition.   Juleen China, M.D. [facility] [place] Office: [phone] Pager:  [phone]

## 2011-11-28 NOTE — Anesthesia Postprocedure Evaluation (Signed)
  Anesthesia Post-op Note  Patient: Richard Townsend  Procedure(s) Performed: Procedure(s) (LRB): ABDOMINAL AORTIC ENDOVASCULAR STENT GRAFT (N/A)  Patient Location: PACU  Anesthesia Type: General  Level of Consciousness: awake, oriented, sedated and patient cooperative  Airway and Oxygen Therapy: Patient Spontanous Breathing and Patient connected to nasal cannula oxygen  Post-op Pain: none  Post-op Assessment: Post-op Vital signs reviewed, Patient's Cardiovascular Status Stable, Respiratory Function Stable, Patent Airway, No signs of Nausea or vomiting and Pain level controlled  Post-op Vital Signs: stable  Complications: No apparent anesthesia complications

## 2011-11-28 NOTE — Anesthesia Preprocedure Evaluation (Addendum)
Anesthesia Evaluation  Patient identified by MRN, date of birth, ID band Patient awake    Reviewed: Allergy & Precautions, H&P , NPO status , Patient's Chart, lab work & pertinent test results  Airway Mallampati: I TM Distance: >3 FB Neck ROM: full    Dental  (+) Dental Advidsory Given   Pulmonary shortness of breath,          Cardiovascular hypertension, Rhythm:regular Rate:Normal     Neuro/Psych  Headaches,    GI/Hepatic GERD-  ,  Endo/Other    Renal/GU      Musculoskeletal   Abdominal   Peds  Hematology   Anesthesia Other Findings   Reproductive/Obstetrics                          Anesthesia Physical Anesthesia Plan  ASA: III  Anesthesia Plan: General   Post-op Pain Management:    Induction: Intravenous  Airway Management Planned: Oral ETT  Additional Equipment: Arterial line, CVP and PA Cath  Intra-op Plan:   Post-operative Plan: Possible Post-op intubation/ventilation  Informed Consent: I have reviewed the patients History and Physical, chart, labs and discussed the procedure including the risks, benefits and alternatives for the proposed anesthesia with the patient or authorized representative who has indicated his/her understanding and acceptance.   Dental Advisory Given  Plan Discussed with: CRNA, Anesthesiologist and Surgeon  Anesthesia Plan Comments:        Anesthesia Quick Evaluation

## 2011-11-28 NOTE — Progress Notes (Signed)
MEDICATION RELATED CONSULT NOTE - INITIAL   Pharmacy Consult for Antibiotic Adjustment Indication: Post-op antibiotics  No Known Allergies  Patient Measurements: Height: 5\' 10"  (177.8 cm) Weight: 198 lb 3.1 oz (89.9 kg) IBW/kg (Calculated) : 73  Labs:  Basename 11/28/11 1259 11/28/11 1130 11/27/11 0815  WBC -- 12.5* 13.2*  HGB 9.9* 12.0* 14.2  HCT 29.0* 36.8* 42.6  PLT -- 162 231  APTT -- 32 --  CREATININE -- 0.75 0.84  LABCREA -- -- --  CREATININE -- 0.75 0.84  CREAT24HRUR -- -- --  MG -- 1.7 --  PHOS -- -- --  ALBUMIN -- -- 3.6  PROT -- -- 7.5  ALBUMIN -- -- 3.6  AST -- -- 8  ALT -- -- 13  ALKPHOS -- -- 85  BILITOT -- -- 0.3  BILIDIR -- -- --  IBILI -- -- --   Estimated Creatinine Clearance: 98.4 ml/min (by C-G formula based on Cr of 0.75). Microbiology: No results found for this or any previous visit (from the past 720 hour(s)).  Assessment: 66 YOM s/p abdominal aortic aneurysm repair on Zinacef 1.5g IV q12 x2 doses. Renal function wnl. WBC 12.5, Afebrile.   Plan:  1. Continue Zinacef 1.5g IV q12 x2 doses. 2. No further adjustment needed- pharmacy will sign off.   Fayne Norrie 11/28/2011,2:03 PM

## 2011-11-28 NOTE — Preoperative (Signed)
Beta Blockers   Reason not to administer Beta Blockers:Not Applicable 

## 2011-11-29 ENCOUNTER — Other Ambulatory Visit: Payer: Self-pay | Admitting: Thoracic Diseases

## 2011-11-29 DIAGNOSIS — I714 Abdominal aortic aneurysm, without rupture: Secondary | ICD-10-CM

## 2011-11-29 LAB — BASIC METABOLIC PANEL
BUN: 6 mg/dL (ref 6–23)
CO2: 26 mEq/L (ref 19–32)
Calcium: 8.3 mg/dL — ABNORMAL LOW (ref 8.4–10.5)
Chloride: 101 mEq/L (ref 96–112)
Creatinine, Ser: 0.81 mg/dL (ref 0.50–1.35)
GFR calc Af Amer: >90 mL/min (ref >90)
GFR calc non Af Amer: 89 mL/min — ABNORMAL LOW (ref >90)
Glucose, Bld: 103 mg/dL — ABNORMAL HIGH (ref 70–99)
Potassium: 3.2 mEq/L — ABNORMAL LOW (ref 3.5–5.1)
Sodium: 136 mEq/L (ref 135–145)

## 2011-11-29 LAB — CBC
HCT: 36.9 % — ABNORMAL LOW (ref 39.0–52.0)
Hemoglobin: 12 g/dL — ABNORMAL LOW (ref 13.0–17.0)
MCH: 27.6 pg (ref 26.0–34.0)
MCHC: 32.5 g/dL (ref 30.0–36.0)
MCV: 85 fL (ref 78.0–100.0)
Platelets: 169 10*3/uL (ref 150–400)
RBC: 4.34 MIL/uL (ref 4.22–5.81)
RDW: 15.4 % (ref 11.5–15.5)
WBC: 13 10*3/uL — ABNORMAL HIGH (ref 4.0–10.5)

## 2011-11-29 LAB — TYPE AND SCREEN
ABO/RH(D): A POS
Antibody Screen: NEGATIVE
Unit division: 0
Unit division: 0

## 2011-11-29 MED ORDER — OXYCODONE-ACETAMINOPHEN 5-325 MG PO TABS: 1.0000 | ORAL_TABLET | ORAL | Status: DC | PRN

## 2011-11-29 NOTE — Progress Notes (Signed)
Discharge instructions given to pt and wife--verbalized understanding of follow-up appt, activity levels, s/s of complications, when to call MD/EMS and home medications. Prescription, information sheets about AAA and specific stent placed given. Renette Butters, Viona Gilmore

## 2011-11-29 NOTE — Discharge Summary (Signed)
Agree with above Abdominal pain resolved after enema.  Stable from aneurysm repair.  F/u in 1 month with CTA  Durene Cal

## 2011-11-29 NOTE — Discharge Summary (Signed)
[facility] Discharge Summary   Patient ID:  Richard Townsend MRN: [id number] DOB/AGE: May 26, 1941 71 y.o.  Admit date: 11/27/2011 Discharge date: 11/29/2011 Date of Surgery: 11/27/2011 - 11/28/2011 Surgeon: Richard Townsend): Richard Libman, MD  Admission Diagnosis: AAA AAA  Discharge Diagnoses:  AAA AAA  Secondary Diagnoses: Past Medical History  Diagnosis Date  . Hypertension   . Shortness of breath   . GERD (gastroesophageal reflux disease)   . AAA (abdominal aortic aneurysm) 11/2011  . Headache   . Arthritis     Procedure(s): ABDOMINAL AORTIC ENDOVASCULAR STENT GRAFT  Discharged Condition: good  HPI: This is a 71 year old gentleman who initially presented to the [facility] emergency department with a four-day history of abdominal pain which radiated to his groin. He was initially treated with pain medication with the thoughts that this was a kidney stone since he has had multiple kidney stones in the past. Last night the pain had not resolved and therefore he went to the emergency department where a CT scan was obtained. The CT scan reveals a 5.8 cm abdominal aortic aneurysm. The patient was therefore transferred to [place] for further management. Upon arrival the patient states that his pain has subsided somewhat.  The patient denies a history of diabetes or hypercholesterolemia. He does have hypertension and has been a long-time smoker. He is status post cholecystectomy and umbilical hernia repair done simultaneously via a laparoscopic approach.    Hospital Course:  Richard Townsend is a 71 y.o. male is S/P Procedure(s): ABDOMINAL AORTIC ENDOVASCULAR STENT GRAFT Extubated: POD # 0 Post-op wounds clean, dry, intact or healing well Pt. Ambulating, voiding and taking PO diet without difficulty. Pt pain controlled with PO pain meds. Labs as below Complications:none  Consults:     Significant Diagnostic Studies: CBC Lab Results  Component Value Date    WBC 13.0* 11/29/2011   HGB 12.0* 11/29/2011   HCT 36.9* 11/29/2011   MCV 85.0 11/29/2011   PLT 169 11/29/2011    BMET    Component Value Date/Time   NA 136 11/29/2011 0430   K 3.2* 11/29/2011 0430   CL 101 11/29/2011 0430   CO2 26 11/29/2011 0430   GLUCOSE 103* 11/29/2011 0430   BUN 6 11/29/2011 0430   CREATININE 0.81 11/29/2011 0430   CALCIUM 8.3* 11/29/2011 0430   GFRNONAA 89* 11/29/2011 0430   GFRAA >90 11/29/2011 0430   COAG Lab Results  Component Value Date   INR 1.06 11/28/2011   INR 0.91 11/27/2011     Disposition:  Discharge to :Home Discharge Orders    Future Orders Please Complete By Expires   Resume previous diet      Driving Restrictions      Comments:   No driving for 4 weeks   Lifting restrictions      Comments:   No lifting for 6 weeks   Call MD for:  temperature >100.5      Call MD for:  redness, tenderness, or signs of infection (pain, swelling, bleeding, redness, odor or green/yellow discharge around incision site)      Call MD for:  severe or increased pain, loss or decreased feeling  in affected limb(s)      May shower       Scheduling Instructions:   Saturday   Increase activity slowly      Comments:   Walk with assistance use walker or cane as needed   No dressing needed      ABDOMINAL PROCEDURE/ANEURYSM REPAIR/AORTO-BIFEMORAL  BYPASS:  Call MD for increased abdominal pain; cramping diarrhea; nausea/vomiting         Richard Townsend  Home Medication Instructions AVW:[id number]   Printed on:11/29/11 8295  Medication Information                    HYDROcodone-acetaminophen (VICODIN) 5-500 MG per tablet Take 0.5 tablets by mouth 2 (two) times daily as needed. For pain           valsartan (DIOVAN) 320 MG tablet Take 320 mg by mouth every morning.           Aspirin-Salicylamide-Caffeine (BC HEADACHE POWDER PO) Take 1 packet by mouth 2 (two) times daily as needed. For headache           beta carotene w/minerals (OCUVITE) tablet Take 1 tablet by  mouth daily.           amLODipine (NORVASC) 5 MG tablet Take 5 mg by mouth every morning.           Ranibizumab (LUCENTIS IO) Inject 1 vial into the eye every 30 (thirty) days.           oxyCODONE-acetaminophen (PERCOCET) 5-325 MG per tablet Take 1 tablet by mouth every 4 (four) hours as needed. For pain            Verbal and written Discharge instructions given to the patient. Wound care per Discharge AVS Follow-up Information    Follow up with Richard Townsend, Richard Lund, MD in 4 weeks. (office will arrange - sent)    Contact information:   [address] [place] [address] [phone]         He has pain medication at home he takes PRN for his lumbar pain.  SignedMosetta Townsend 11/29/2011, 8:12 AM

## 2011-11-29 NOTE — Progress Notes (Signed)
[facility] [place]  Post-op EVAR Date of Surgery: 11/27/2011 - 11/28/2011 Surgeon: Moishe Spice): Nada Libman, MD POD: 1 Day Post-Op Device:   History of Present Illness  Richard Townsend is a 71 y.o. male who is s/p EVAR. The patient complains of back pain secondary to lumbar arthritis history; denies abdominal pain; denies lower extremity pain.  He is Ambulating and taking PO well without nausea or vomiting. Pt. has voided with foley out  IMAGING: Dg Chest Portable 1 View  11/28/2011  *RADIOLOGY REPORT*  Clinical Data: Interval placement of stent graft.  PORTABLE CHEST - 1 VIEW  Comparison: Portable chest 11/27/2011.  Findings: The heart is enlarged.  A right IJ sheath is in place. Mild pulmonary vascular congestion is present.  Mild bibasilar atelectasis is noted.  The lungs are otherwise clear.  IMPRESSION:  1.  Cardiomegaly with mild pulmonary vascular congestion. 2.  Right IJ sheath is in place.  Original Report Authenticated By: Jamesetta Orleans. Richard Townsend, M.D.   Dg Abd Portable 1v  11/28/2011  *RADIOLOGY REPORT*  Clinical Data: Status post placement of a stent graft.  The  PORTABLE ABDOMEN - 1 VIEW  Comparison: CT of the chest, abdomen, and pelvis 11/27/2011.  Findings: The patient is now status post placement of the aorto- iliac stent graft.  The stent is open and follows the contour of the aorta.  Rightward curvature is noted in the upper lumbar spine. The bowel gas pattern is unremarkable.  IMPRESSION:  1.  Interval placement of an aorto femoral stent graft without radiographic evidence for complication. 2.  Scoliosis.  Original Report Authenticated By: Jamesetta Orleans. Richard Townsend, M.D.    Significant Diagnostic Studies: CBC Lab Results  Component Value Date   WBC 13.0* 11/29/2011   HGB 12.0* 11/29/2011   HCT 36.9* 11/29/2011   MCV 85.0 11/29/2011   PLT 169 11/29/2011     BMET    Component Value Date/Time   NA 136 11/29/2011 0430   K 3.2* 11/29/2011 0430   CL  101 11/29/2011 0430   CO2 26 11/29/2011 0430   GLUCOSE 103* 11/29/2011 0430   BUN 6 11/29/2011 0430   CREATININE 0.81 11/29/2011 0430   CALCIUM 8.3* 11/29/2011 0430   GFRNONAA 89* 11/29/2011 0430   GFRAA >90 11/29/2011 0430    COAG Lab Results  Component Value Date   INR 1.06 11/28/2011   INR 0.91 11/27/2011   No results found for this basename: PTT     I/O last 3 completed shifts: In: 3560.8 [P.O.:240; I.V.:3320.8] Out: 2725 [Urine:2475; Blood:250] No data found.   Physical Examination  BP Readings from Last 3 Encounters:  11/29/11 155/67  11/29/11 155/67   Temp Readings from Last 3 Encounters:  11/29/11 99.7 F (37.6 C) Oral  11/29/11 99.7 F (37.6 C) Oral   SpO2 Readings from Last 3 Encounters:  11/29/11 95%  11/29/11 95%   Pulse Readings from Last 3 Encounters:  11/29/11 88  11/29/11 88    General: A&O x 3, WDWN male in NAD Gait: Normal Pulmonary: normal non-labored breathing  Cardiac: RRR Abdomen: soft, NT, NABS Bilateral groin wounds: clean, dry, intact, without hematoma Vascular Exam/Pulses:PT biphasic, DP weak monophasic doppler bilateral equal.  Extremities without ischemic changes, no Gangrene , no cellulitis; no open wounds;   Neurologic: A&O X 3; Appropriate Affect  Assessment: Richard Townsend is a 71 y.o. male who is 1 Day Post-Op EVAR.  Pt is doing well with no complaints  Plan: Home  The importance of surveillance of the endograft was discussed with the patient  A CTA of abdomen and pelvis will be scheduled for one month to assess for endoleak.  The patient will follow up with [place] in one month with these studies. With Dr. Myra Gianotti.   SignedThomasena Edis, [place] Marisue Humble [phone] 11/29/2011 8:08 AM.

## 2011-12-03 ENCOUNTER — Telehealth: Payer: Self-pay | Admitting: Vascular Surgery

## 2011-12-03 NOTE — Telephone Encounter (Signed)
Patient scheduled for f/u and CTA- pt is aware and mailed paperwork, dpm

## 2011-12-03 NOTE — Telephone Encounter (Signed)
Message copied by Fredrich Birks on Tue Dec 03, 2011  2:42 PM ------      Message from: Melene Plan      Created: Fri Nov 29, 2011  9:57 AM                   ----- Message -----         From: Marlowe Shores, PA         Sent: 11/29/2011   7:50 AM           To: Melene Plan, RN            4 week F/U Myra Gianotti - EVAR

## 2012-01-03 ENCOUNTER — Encounter: Payer: Self-pay | Admitting: Surgery

## 2012-01-06 ENCOUNTER — Ambulatory Visit (INDEPENDENT_AMBULATORY_CARE_PROVIDER_SITE_OTHER): Payer: Medicare PPO | Admitting: *Deleted

## 2012-01-06 ENCOUNTER — Ambulatory Visit
Admission: RE | Admit: 2012-01-06 | Discharge: 2012-01-06 | Disposition: A | Discharge status: Home / Self Care | Payer: Medicare PPO | Source: Ambulatory Visit | Attending: Thoracic Diseases | Admitting: Thoracic Diseases

## 2012-01-06 ENCOUNTER — Ambulatory Visit (INDEPENDENT_AMBULATORY_CARE_PROVIDER_SITE_OTHER): Payer: Medicare PPO | Admitting: Surgery

## 2012-01-06 ENCOUNTER — Encounter: Payer: Self-pay | Admitting: Surgery

## 2012-01-06 VITALS — BP 142/83 | HR 80 | Resp 16 | Ht 70.5 in | Wt 189.4 lb

## 2012-01-06 DIAGNOSIS — Z48812 Encounter for surgical aftercare following surgery on the circulatory system: Secondary | ICD-10-CM

## 2012-01-06 DIAGNOSIS — I6529 Occlusion and stenosis of unspecified carotid artery: Secondary | ICD-10-CM

## 2012-01-06 DIAGNOSIS — I714 Abdominal aortic aneurysm, without rupture, unspecified: Secondary | ICD-10-CM

## 2012-01-06 DIAGNOSIS — T82898A Other specified complication of vascular prosthetic devices, implants and grafts, initial encounter: Secondary | ICD-10-CM

## 2012-01-06 HISTORY — DX: Abdominal aortic aneurysm, without rupture: I71.4

## 2012-01-06 HISTORY — DX: Abdominal aortic aneurysm, without rupture, unspecified: I71.40

## 2012-01-06 MED ORDER — IOHEXOL 350 MG/ML SOLN
80.0000 mL | Freq: Once | INTRAVENOUS | Status: AC | PRN
  Administered 2012-01-06: 80 mL via INTRAVENOUS

## 2012-01-06 NOTE — Progress Notes (Signed)
The patient is here today for followup. He is status post endovascular repair of a symptomatic abdominal aortic aneurysm on 11/28/2011. His postoperative course was uncomplicated. He is back today for followup. He has no complaints. I did have difficulty with full visualization of the lower left renal artery during the completion arteriogram.  The patient came with a CT scan which shows a decrease in the size of his aneurysm. There is preservation of the patency of the left renal artery however opacification of the left kidney is not as symmetric as the right.  Patient's groin wounds are healing nicely.  I obtained a carotid ultrasound today as the patient was taken urgently to the operating room and did not have this done preoperatively. This reveals 40-59% bilateral stenosis.  Overall the patient is doing very well. I have encouraged him to try to stop smoking. I stressed the importance of good blood pressure and cholesterol management with him. He is going to come by to see me in 6 months with a repeat abdominal ultrasound. I located a carotid duplex at one year.

## 2012-01-13 NOTE — Procedures (Unsigned)
CAROTID DUPLEX EXAM  INDICATION:  Carotid artery disease.  HISTORY: Diabetes:  No Cardiac:  No Hypertension:  Yes Smoking:  Yes Previous Surgery:  Abdominal aortic repair June of 2013 by Dr. Myra Gianotti CV History:  Macular degeneration Amaurosis Fugax No, Paresthesias No, Hemiparesis No                                      RIGHT             LEFT Brachial systolic pressure:         148               146 Brachial Doppler waveforms:         Triphasic         Triphasic Vertebral direction of flow:        Antegrade         Antegrade DUPLEX VELOCITIES (cm/sec) CCA peak systolic                   103               93 ECA peak systolic                   102               118 ICA peak systolic                   159               178 slight curve ICA end diastolic                   62                61 PLAQUE MORPHOLOGY:                  Heterogeneous     Heterogeneous PLAQUE AMOUNT:                      Moderate          Mild to moderate PLAQUE LOCATION:                    Bifurcation, ICA, ECA               CCA, bifurcation, ICA  IMPRESSION: 1. 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 2. Vertebral artery flow antegrade bilaterally.  ___________________________________________ V. Charlena Cross, MD  SS/MEDQ  D:  01/06/2012  T:  01/06/2012  Job:  [id number]

## 2012-06-30 ENCOUNTER — Other Ambulatory Visit: Payer: Self-pay | Admitting: *Deleted

## 2012-06-30 DIAGNOSIS — Z48812 Encounter for surgical aftercare following surgery on the circulatory system: Secondary | ICD-10-CM

## 2012-06-30 DIAGNOSIS — I739 Peripheral vascular disease, unspecified: Secondary | ICD-10-CM

## 2012-07-10 ENCOUNTER — Encounter: Payer: Self-pay | Admitting: Surgery

## 2012-07-13 ENCOUNTER — Encounter: Payer: Self-pay | Admitting: Surgery

## 2012-07-13 ENCOUNTER — Encounter (INDEPENDENT_AMBULATORY_CARE_PROVIDER_SITE_OTHER): Payer: Medicare PPO | Admitting: *Deleted

## 2012-07-13 ENCOUNTER — Other Ambulatory Visit: Payer: Self-pay | Admitting: *Deleted

## 2012-07-13 ENCOUNTER — Ambulatory Visit (INDEPENDENT_AMBULATORY_CARE_PROVIDER_SITE_OTHER): Payer: Medicare PPO | Admitting: Surgery

## 2012-07-13 VITALS — BP 139/67 | HR 74 | Ht 70.5 in | Wt 190.1 lb

## 2012-07-13 DIAGNOSIS — Z48812 Encounter for surgical aftercare following surgery on the circulatory system: Secondary | ICD-10-CM

## 2012-07-13 DIAGNOSIS — I739 Peripheral vascular disease, unspecified: Secondary | ICD-10-CM

## 2012-07-13 DIAGNOSIS — I6529 Occlusion and stenosis of unspecified carotid artery: Secondary | ICD-10-CM

## 2012-07-13 DIAGNOSIS — I714 Abdominal aortic aneurysm, without rupture, unspecified: Secondary | ICD-10-CM
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
## 2013-01-08 ENCOUNTER — Encounter: Payer: Self-pay | Admitting: Surgery

## 2013-01-11 ENCOUNTER — Ambulatory Visit (INDEPENDENT_AMBULATORY_CARE_PROVIDER_SITE_OTHER): Payer: Medicare PPO | Admitting: Surgery

## 2013-01-11 ENCOUNTER — Other Ambulatory Visit (INDEPENDENT_AMBULATORY_CARE_PROVIDER_SITE_OTHER): Payer: Medicare PPO | Admitting: *Deleted

## 2013-01-11 ENCOUNTER — Ambulatory Visit
Admission: RE | Admit: 2013-01-11 | Discharge: 2013-01-11 | Disposition: A | Discharge status: Home / Self Care | Payer: Medicare PPO | Source: Ambulatory Visit | Attending: Surgery | Admitting: Surgery

## 2013-01-11 ENCOUNTER — Encounter: Payer: Self-pay | Admitting: Surgery

## 2013-01-11 DIAGNOSIS — I6529 Occlusion and stenosis of unspecified carotid artery: Secondary | ICD-10-CM

## 2013-01-11 DIAGNOSIS — R198 Other specified symptoms and signs involving the digestive system and abdomen: Secondary | ICD-10-CM

## 2013-01-11 DIAGNOSIS — Z48812 Encounter for surgical aftercare following surgery on the circulatory system: Secondary | ICD-10-CM

## 2013-01-11 DIAGNOSIS — I714 Abdominal aortic aneurysm, without rupture, unspecified: Secondary | ICD-10-CM | POA: Insufficient documentation

## 2013-01-11 HISTORY — DX: Occlusion and stenosis of unspecified carotid artery: I65.29

## 2013-01-11 HISTORY — DX: Other specified symptoms and signs involving the digestive system and abdomen: R19.8

## 2013-01-11 MED ORDER — IOHEXOL 300 MG/ML  SOLN
100.0000 mL | Freq: Once | INTRAMUSCULAR | Status: AC | PRN
  Administered 2013-01-11: 100 mL via INTRAVENOUS
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
## 2013-01-11 NOTE — Addendum Note (Signed)
Addended by: Adria Dill L on: 01/11/2013 02:24 PM   Modules accepted: Orders

## 2013-07-12 ENCOUNTER — Ambulatory Visit: Payer: Medicare PPO | Admitting: Family

## 2013-07-12 ENCOUNTER — Encounter: Payer: Self-pay | Admitting: Family

## 2013-07-12 ENCOUNTER — Other Ambulatory Visit (HOSPITAL_COMMUNITY): Payer: Medicare PPO

## 2013-07-13 ENCOUNTER — Ambulatory Visit (INDEPENDENT_AMBULATORY_CARE_PROVIDER_SITE_OTHER): Payer: Medicare PPO | Admitting: Family

## 2013-07-13 ENCOUNTER — Encounter: Payer: Self-pay | Admitting: Family

## 2013-07-13 ENCOUNTER — Ambulatory Visit (HOSPITAL_COMMUNITY)
Admission: RE | Admit: 2013-07-13 | Discharge: 2013-07-13 | Disposition: A | Discharge status: Home / Self Care | Payer: Medicare PPO | Source: Ambulatory Visit | Attending: Family | Admitting: Family

## 2013-07-13 VITALS — BP 124/74 | HR 75 | Resp 16 | Ht 71.0 in | Wt 196.0 lb

## 2013-07-13 DIAGNOSIS — I714 Abdominal aortic aneurysm, without rupture, unspecified: Secondary | ICD-10-CM

## 2013-07-13 DIAGNOSIS — R198 Other specified symptoms and signs involving the digestive system and abdomen: Secondary | ICD-10-CM

## 2013-07-13 DIAGNOSIS — R110 Nausea: Secondary | ICD-10-CM

## 2013-07-13 DIAGNOSIS — Z48812 Encounter for surgical aftercare following surgery on the circulatory system: Secondary | ICD-10-CM

## 2013-07-13 HISTORY — DX: Nausea: R11.0

## 2013-07-13 NOTE — Patient Instructions (Addendum)
Abdominal Aortic Aneurysm An aneurysm is a weakened or damaged part of an artery wall that bulges from the normal force of blood pumping through the body. An abdominal aortic aneurysm is an aneurysm that occurs in the lower part of the aorta, the main artery of the body.  The major concern with an abdominal aortic aneurysm is that it can enlarge and burst (rupture) or blood can flow between the layers of the wall of the aorta through a tear (aorticdissection). Both of these conditions can cause bleeding inside the body and can be life threatening unless diagnosed and treated promptly. CAUSES  The exact cause of an abdominal aortic aneurysm is unknown. Some contributing factors are:   A hardening of the arteries caused by the buildup of fat and other substances in the lining of a blood vessel (arteriosclerosis).  Inflammation of the walls of an artery (arteritis).   Connective tissue diseases, such as Marfan syndrome.   Abdominal trauma.   An infection, such as syphilis or staphylococcus, in the wall of the aorta (infectious aortitis) caused by bacteria. RISK FACTORS  Risk factors that contribute to an abdominal aortic aneurysm may include:  Age older than 60 years.   High blood pressure (hypertension).  Male gender.  Ethnicity (white race).  Obesity.  Family history of aneurysm (first degree relatives only).  Tobacco use. PREVENTION  The following healthy lifestyle habits may help decrease your risk of abdominal aortic aneurysm:  Quitting smoking. Smoking can raise your blood pressure and cause arteriosclerosis.  Limiting or avoiding alcohol.  Keeping your blood pressure, blood sugar level, and cholesterol levels within normal limits.  Decreasing your salt intake. In somepeople, too much salt can raise blood pressure and increase your risk of abdominal aortic aneurysm.  Eating a diet low in saturated fats and cholesterol.  Increasing your fiber intake by including  whole grains, vegetables, and fruits in your diet. Eating these foods may help lower blood pressure.  Maintaining a healthy weight.  Staying physically active and exercising regularly. SYMPTOMS  The symptoms of abdominal aortic aneurysm may vary depending on the size and rate of growth of the aneurysm.Most grow slowly and do not have any symptoms. When symptoms do occur, they may include:  Pain (abdomen, side, lower back, or groin). The pain may vary in intensity. A sudden onset of severe pain may indicate that the aneurysm has ruptured.  Feeling full after eating only small amounts of food.  Nausea or vomiting or both.  Feeling a pulsating lump in the abdomen.  Feeling faint or passing out. DIAGNOSIS  Since most unruptured abdominal aortic aneurysms have no symptoms, they are often discovered during diagnostic exams for other conditions. An aneurysm may be found during the following procedures:  Ultrasonography (A one-time screening for abdominal aortic aneurysm by ultrasonography is also recommended for all men aged 65-75 years who have ever smoked).  X-ray exams.  A computed tomography (CT).  Magnetic resonance imaging (MRI).  Angiography or arteriography. TREATMENT  Treatment of an abdominal aortic aneurysm depends on the size of your aneurysm, your age, and risk factors for rupture. Medication to control blood pressure and pain may be used to manage aneurysms smaller than 6 cm. Regular monitoring for enlargement may be recommended by your caregiver if:  The aneurysm is 3 4 cm in size (an annual ultrasonography may be recommended).  The aneurysm is 4 4.5 cm in size (an ultrasonography every 6 months may be recommended).  The aneurysm is larger than 4.5   cm in size (your caregiver may ask that you be examined by a vascular surgeon). If your aneurysm is larger than 6 cm, surgical repair may be recommended. There are two main methods for repair of an aneurysm:   Endovascular  repair (a minimally invasive surgery). This is done most often.  Open repair. This method is used if an endovascular repair is not possible. Document Released: 03/06/2005 Document Revised: 09/21/2012 Document Reviewed: 06/26/2012 [facility] Patient Information 2014 [place], [place].   Stroke Prevention Some medical conditions and behaviors are associated with an increased chance of having a stroke. You may prevent a stroke by making healthy choices and managing medical conditions. HOW CAN I REDUCE MY RISK OF HAVING A STROKE?   Stay physically active. Get at least 30 minutes of activity on most or all days.  Do not smoke. It may also be helpful to avoid exposure to secondhand smoke.  Limit alcohol use. Moderate alcohol use is considered to be:  No more than 2 drinks per day for men.  No more than 1 drink per day for nonpregnant women.  Eat healthy foods. This involves  Eating 5 or more servings of fruits and vegetables a day.  Following a diet that addresses high blood pressure (hypertension), high cholesterol, diabetes, or obesity.  Manage your cholesterol levels.  A diet low in saturated fat, trans fat, and cholesterol and high in fiber may control cholesterol levels.  Take any prescribed medicines to control cholesterol as directed by your health care provider.  Manage your diabetes.  A controlled-carbohydrate, controlled-sugar diet is recommended to manage diabetes.  Take any prescribed medicines to control diabetes as directed by your health care provider.  Control your hypertension.  A low-salt (sodium), low-saturated fat, low-trans fat, and low-cholesterol diet is recommended to manage hypertension.  Take any prescribed medicines to control hypertension as directed by your health care provider.  Maintain a healthy weight.  A reduced-calorie, low-sodium, low-saturated fat, low-trans fat, low-cholesterol diet is recommended to manage weight.  Stop drug  abuse.  Avoid taking birth control pills.  Talk to your health care provider about the risks of taking birth control pills if you are over 82 years old, smoke, get migraines, or have ever had a blood clot.  Get evaluated for sleep disorders (sleep apnea).  Talk to your health care provider about getting a sleep evaluation if you snore a lot or have excessive sleepiness.  Take medicines as directed by your health care provider.  For some people, aspirin or blood thinners (anticoagulants) are helpful in reducing the risk of forming abnormal blood clots that can lead to stroke. If you have the irregular heart rhythm of atrial fibrillation, you should be on a blood thinner unless there is a good reason you cannot take them.  Understand all your medicine instructions.  Make sure that other other conditions (such as anemia or atherosclerosis) are addressed. SEEK IMMEDIATE MEDICAL CARE IF:   You have sudden weakness or numbness of the face, arm, or leg, especially on one side of the body.  Your face or eyelid droops to one side.  You have sudden confusion.  You have trouble speaking (aphasia) or understanding.  You have sudden trouble seeing in one or both eyes.  You have sudden trouble walking.  You have dizziness.  You have a loss of balance or coordination.  You have a sudden, severe headache with no known cause.  You have new chest pain or an irregular heartbeat. Any of these symptoms  may represent a serious problem that is an emergency. Do not wait to see if the symptoms will go away. Get medical help at once. Call your local emergency services  (911 in [place] Do not drive yourself to the hospital. Document Released: 07/04/2004 Document Revised: 03/17/2013 Document Reviewed: 11/27/2012 [facility] Patient Information 2014 [place].   Smoking Cessation Quitting smoking is important to your health and has many advantages. However, it is not always easy to quit since  nicotine is a very addictive drug. Often times, people try 3 times or more before being able to quit. This document explains the best ways for you to prepare to quit smoking. Quitting takes hard work and a lot of effort, but you can do it. ADVANTAGES OF QUITTING SMOKING  You will live longer, feel better, and live better.  Your body will feel the impact of quitting smoking almost immediately.  Within 20 minutes, blood pressure decreases. Your pulse returns to its normal level.  After 8 hours, carbon monoxide levels in the blood return to normal. Your oxygen level increases.  After 24 hours, the chance of having a heart attack starts to decrease. Your breath, hair, and body stop smelling like smoke.  After 48 hours, damaged nerve endings begin to recover. Your sense of taste and smell improve.  After 72 hours, the body is virtually free of nicotine. Your bronchial tubes relax and breathing becomes easier.  After 2 to 12 weeks, lungs can hold more air. Exercise becomes easier and circulation improves.  The risk of having a heart attack, stroke, cancer, or lung disease is greatly reduced.  After 1 year, the risk of coronary heart disease is cut in half.  After 5 years, the risk of stroke falls to the same as a nonsmoker.  After 10 years, the risk of lung cancer is cut in half and the risk of other cancers decreases significantly.  After 15 years, the risk of coronary heart disease drops, usually to the level of a nonsmoker.  If you are pregnant, quitting smoking will improve your chances of having a healthy baby.  The people you live with, especially any children, will be healthier.  You will have extra money to spend on things other than cigarettes. QUESTIONS TO THINK ABOUT BEFORE ATTEMPTING TO QUIT You may want to talk about your answers with your caregiver.  Why do you want to quit?  If you tried to quit in the past, what helped and what did not?  What will be the most  difficult situations for you after you quit? How will you plan to handle them?  Who can help you through the tough times? Your family? Friends? A caregiver?  What pleasures do you get from smoking? What ways can you still get pleasure if you quit? Here are some questions to ask your caregiver:  How can you help me to be successful at quitting?  What medicine do you think would be best for me and how should I take it?  What should I do if I need more help?  What is smoking withdrawal like? How can I get information on withdrawal? GET READY  Set a quit date.  Change your environment by getting rid of all cigarettes, ashtrays, matches, and lighters in your home, car, or work. Do not let people smoke in your home.  Review your past attempts to quit. Think about what worked and what did not. GET SUPPORT AND ENCOURAGEMENT You have a better chance of being successful  if you have help. You can get support in many ways.  Tell your family, friends, and co-workers that you are going to quit and need their support. Ask them not to smoke around you.  Get individual, group, or telephone counseling and support. Programs are available at [employer] and health centers. Call your local health department for information about programs in your area.  Spiritual beliefs and practices may help some smokers quit.  Download a "quit meter" on your computer to keep track of quit statistics, such as how long you have gone without smoking, cigarettes not smoked, and money saved.  Get a self-help book about quitting smoking and staying off of tobacco. [place] yourself from urges to smoke. Talk to someone, go for a walk, or occupy your time with a task.  Change your normal routine. Take a different route to work. Drink tea instead of coffee. Eat breakfast in a different place.  Reduce your stress. Take a hot bath, exercise, or read a book.  Plan something enjoyable to  do every day. Reward yourself for not smoking.  Explore interactive web-based programs that specialize in helping you quit. GET MEDICINE AND USE IT CORRECTLY Medicines can help you stop smoking and decrease the urge to smoke. Combining medicine with the above behavioral methods and support can greatly increase your chances of successfully quitting smoking.  Nicotine replacement therapy helps deliver nicotine to your body without the negative effects and risks of smoking. Nicotine replacement therapy includes nicotine gum, lozenges, inhalers, nasal sprays, and skin patches. Some may be available over-the-counter and others require a prescription.  Antidepressant medicine helps people abstain from smoking, but how this works is unknown. This medicine is available by prescription.  Nicotinic receptor partial agonist medicine simulates the effect of nicotine in your brain. This medicine is available by prescription. Ask your caregiver for advice about which medicines to use and how to use them based on your health history. Your caregiver will tell you what side effects to look out for if you choose to be on a medicine or therapy. Carefully read the information on the package. Do not use any other product containing nicotine while using a nicotine replacement product.  RELAPSE OR DIFFICULT SITUATIONS Most relapses occur within the first 3 months after quitting. Do not be discouraged if you start smoking again. Remember, most people try several times before finally quitting. You may have symptoms of withdrawal because your body is used to nicotine. You may crave cigarettes, be irritable, feel very hungry, cough often, get headaches, or have difficulty concentrating. The withdrawal symptoms are only temporary. They are strongest when you first quit, but they will go away within 10 14 days. To reduce the chances of relapse, try to:  Avoid drinking alcohol. Drinking lowers your chances of successfully  quitting.  Reduce the amount of caffeine you consume. Once you quit smoking, the amount of caffeine in your body increases and can give you symptoms, such as a rapid heartbeat, sweating, and anxiety.  Avoid smokers because they can make you want to smoke.  Do not let weight gain distract you. Many smokers will gain weight when they quit, usually less than 10 pounds. Eat a healthy diet and stay active. You can always lose the weight gained after you quit.  Find ways to improve your mood other than smoking. FOR MORE INFORMATION  www.smokefree.gov  Document Released: 05/21/2001 Document Revised: 11/26/2011 Document Reviewed: 09/05/2011 [facility] Patient Information 2014 [place],  [place].  
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
## 2014-01-10 ENCOUNTER — Ambulatory Visit: Payer: Medicare PPO | Admitting: Family

## 2014-01-10 ENCOUNTER — Other Ambulatory Visit (HOSPITAL_COMMUNITY): Payer: Medicare PPO

## 2014-07-18 ENCOUNTER — Encounter (HOSPITAL_COMMUNITY): Payer: Medicare PPO

## 2014-07-18 ENCOUNTER — Ambulatory Visit: Payer: Medicare PPO | Admitting: Family

## 2014-07-18 ENCOUNTER — Other Ambulatory Visit (HOSPITAL_COMMUNITY): Payer: Medicare PPO

## 2014-08-03 ENCOUNTER — Ambulatory Visit: Payer: Medicare PPO | Admitting: Family

## 2014-08-03 ENCOUNTER — Other Ambulatory Visit (HOSPITAL_COMMUNITY): Payer: Medicare PPO

## 2014-08-03 ENCOUNTER — Encounter (HOSPITAL_COMMUNITY): Payer: Medicare PPO

## 2014-09-28 ENCOUNTER — Encounter (HOSPITAL_COMMUNITY): Payer: Medicare PPO

## 2014-09-28 ENCOUNTER — Ambulatory Visit: Payer: Medicare PPO | Admitting: Family

## 2014-09-28 ENCOUNTER — Other Ambulatory Visit (HOSPITAL_COMMUNITY): Payer: Medicare PPO

## 2015-07-04 ENCOUNTER — Encounter: Payer: Self-pay | Admitting: Family

## 2015-07-12 ENCOUNTER — Other Ambulatory Visit: Payer: Self-pay | Admitting: *Deleted

## 2015-07-12 DIAGNOSIS — Z95828 Presence of other vascular implants and grafts: Secondary | ICD-10-CM

## 2015-07-12 DIAGNOSIS — M79604 Pain in right leg: Secondary | ICD-10-CM

## 2015-07-12 DIAGNOSIS — M79605 Pain in left leg: Principal | ICD-10-CM

## 2015-07-13 ENCOUNTER — Encounter: Payer: Self-pay | Admitting: Family

## 2015-07-13 ENCOUNTER — Ambulatory Visit (INDEPENDENT_AMBULATORY_CARE_PROVIDER_SITE_OTHER): Payer: Medicare PPO | Admitting: Family

## 2015-07-13 ENCOUNTER — Encounter (HOSPITAL_COMMUNITY): Payer: Medicare PPO

## 2015-07-13 ENCOUNTER — Ambulatory Visit (HOSPITAL_COMMUNITY)
Admission: RE | Admit: 2015-07-13 | Discharge: 2015-07-13 | Disposition: A | Discharge status: Home / Self Care | Payer: Medicare PPO | Source: Ambulatory Visit | Attending: Family | Admitting: Family

## 2015-07-13 VITALS — BP 143/77 | HR 69 | Ht 71.0 in | Wt 195.1 lb

## 2015-07-13 DIAGNOSIS — I10 Essential (primary) hypertension: Secondary | ICD-10-CM | POA: Diagnosis not present

## 2015-07-13 DIAGNOSIS — Z95828 Presence of other vascular implants and grafts: Secondary | ICD-10-CM | POA: Diagnosis not present

## 2015-07-13 DIAGNOSIS — Z720 Tobacco use: Secondary | ICD-10-CM

## 2015-07-13 DIAGNOSIS — I6523 Occlusion and stenosis of bilateral carotid arteries: Secondary | ICD-10-CM

## 2015-07-13 DIAGNOSIS — M79604 Pain in right leg: Secondary | ICD-10-CM | POA: Diagnosis not present

## 2015-07-13 DIAGNOSIS — I714 Abdominal aortic aneurysm, without rupture, unspecified: Secondary | ICD-10-CM

## 2015-07-13 DIAGNOSIS — M79605 Pain in left leg: Secondary | ICD-10-CM | POA: Diagnosis not present

## 2015-07-13 DIAGNOSIS — Z48812 Encounter for surgical aftercare following surgery on the circulatory system: Secondary | ICD-10-CM

## 2015-07-13 DIAGNOSIS — Z8679 Personal history of other diseases of the circulatory system: Secondary | ICD-10-CM

## 2015-07-13 DIAGNOSIS — F17200 Nicotine dependence, unspecified, uncomplicated: Secondary | ICD-10-CM

## 2015-07-13 DIAGNOSIS — Z4889 Encounter for other specified surgical aftercare: Secondary | ICD-10-CM

## 2015-07-13 NOTE — Patient Instructions (Addendum)
Stroke Prevention Some medical conditions and behaviors are associated with an increased chance of having a stroke. You may prevent a stroke by making healthy choices and managing medical conditions. HOW CAN I REDUCE MY RISK OF HAVING A STROKE?   Stay physically active. Get at least 30 minutes of activity on most or all days.  Do not smoke. It may also be helpful to avoid exposure to secondhand smoke.  Limit alcohol use. Moderate alcohol use is considered to be:  No more than 2 drinks per day for men.  No more than 1 drink per day for nonpregnant women.  Eat healthy foods. This involves:  Eating 5 or more servings of fruits and vegetables a day.  Making dietary changes that address high blood pressure (hypertension), high cholesterol, diabetes, or obesity.  Manage your cholesterol levels.  Making food choices that are high in fiber and low in saturated fat, trans fat, and cholesterol may control cholesterol levels.  Take any prescribed medicines to control cholesterol as directed by your health care provider.  Manage your diabetes.  Controlling your carbohydrate and sugar intake is recommended to manage diabetes.  Take any prescribed medicines to control diabetes as directed by your health care provider.  Control your hypertension.  Making food choices that are low in salt (sodium), saturated fat, trans fat, and cholesterol is recommended to manage hypertension.  Ask your health care provider if you need treatment to lower your blood pressure. Take any prescribed medicines to control hypertension as directed by your health care provider.  If you are 18-39 years of age, have your blood pressure checked every 3-5 years. If you are 40 years of age or older, have your blood pressure checked every year.  Maintain a healthy weight.  Reducing calorie intake and making food choices that are low in sodium, saturated fat, trans fat, and cholesterol are recommended to manage  weight.  Stop drug abuse.  Avoid taking birth control pills.  Talk to your health care provider about the risks of taking birth control pills if you are over 35 years old, smoke, get migraines, or have ever had a blood clot.  Get evaluated for sleep disorders (sleep apnea).  Talk to your health care provider about getting a sleep evaluation if you snore a lot or have excessive sleepiness.  Take medicines only as directed by your health care provider.  For some people, aspirin or blood thinners (anticoagulants) are helpful in reducing the risk of forming abnormal blood clots that can lead to stroke. If you have the irregular heart rhythm of atrial fibrillation, you should be on a blood thinner unless there is a good reason you cannot take them.  Understand all your medicine instructions.  Make sure that other conditions (such as anemia or atherosclerosis) are addressed. SEEK IMMEDIATE MEDICAL CARE IF:   You have sudden weakness or numbness of the face, arm, or leg, especially on one side of the body.  Your face or eyelid droops to one side.  You have sudden confusion.  You have trouble speaking (aphasia) or understanding.  You have sudden trouble seeing in one or both eyes.  You have sudden trouble walking.  You have dizziness.  You have a loss of balance or coordination.  You have a sudden, severe headache with no known cause.  You have new chest pain or an irregular heartbeat. Any of these symptoms may represent a serious problem that is an emergency. Do not wait to see if the symptoms will   go away. Get medical help at once. Call your local emergency services (911 in [place] Do not drive yourself to the hospital.   This information is not intended to replace advice given to you by your health care provider. Make sure you discuss any questions you have with your health care provider.   Document Released: 07/04/2004 Document Revised: 06/17/2014 Document Reviewed:  11/27/2012 Elsevier Interactive Patient Education 2016 Elsevier Inc.    Smoking Cessation, Tips for Success If you are ready to quit smoking, congratulations! You have chosen to help yourself be healthier. Cigarettes bring nicotine, tar, carbon monoxide, and other irritants into your body. Your lungs, heart, and blood vessels will be able to work better without these poisons. There are many different ways to quit smoking. Nicotine gum, nicotine patches, a nicotine inhaler, or nicotine nasal spray can help with physical craving. Hypnosis, support groups, and medicines help break the habit of smoking. WHAT THINGS CAN I DO TO MAKE QUITTING EASIER?  Here are some tips to help you quit for good:  Pick a date when you will quit smoking completely. Tell all of your friends and family about your plan to quit on that date.  Do not try to slowly cut down on the number of cigarettes you are smoking. Pick a quit date and quit smoking completely starting on that day.  Throw away all cigarettes.   Clean and remove all ashtrays from your home, work, and car.  On a card, write down your reasons for quitting. Carry the card with you and read it when you get the urge to smoke.  Cleanse your body of nicotine. Drink enough water and fluids to keep your urine clear or pale yellow. Do this after quitting to flush the nicotine from your body.  Learn to predict your moods. Do not let a bad situation be your excuse to have a cigarette. Some situations in your life might tempt you into wanting a cigarette.  Never have "just one" cigarette. It leads to wanting another and another. Remind yourself of your decision to quit.  Change habits associated with smoking. If you smoked while driving or when feeling stressed, try other activities to replace smoking. Stand up when drinking your coffee. Brush your teeth after eating. Sit in a different chair when you read the paper. Avoid alcohol while trying to quit, and try to  drink fewer caffeinated beverages. Alcohol and caffeine may urge you to smoke.  Avoid foods and drinks that can trigger a desire to smoke, such as sugary or spicy foods and alcohol.  Ask people who smoke not to smoke around you.  Have something planned to do right after eating or having a cup of coffee. For example, plan to take a walk or exercise.  Try a relaxation exercise to calm you down and decrease your stress. Remember, you may be tense and nervous for the first 2 weeks after you quit, but this will pass.  Find new activities to keep your hands busy. Play with a pen, coin, or rubber band. Doodle or draw things on paper.  Brush your teeth right after eating. This will help cut down on the craving for the taste of tobacco after meals. You can also try mouthwash.   Use oral substitutes in place of cigarettes. Try using lemon drops, carrots, cinnamon sticks, or chewing gum. Keep them handy so they are available when you have the urge to smoke.  When you have the urge to smoke, try deep breathing.    Designate your home as a nonsmoking area.  If you are a heavy smoker, ask your health care provider about a prescription for nicotine chewing gum. It can ease your withdrawal from nicotine.  Reward yourself. Set aside the cigarette money you save and buy yourself something nice.  Look for support from others. Join a support group or smoking cessation program. Ask someone at home or at work to help you with your plan to quit smoking.  Always ask yourself, "Do I need this cigarette or is this just a reflex?" Tell yourself, "Today, I choose not to smoke," or "I do not want to smoke." You are reminding yourself of your decision to quit.  Do not replace cigarette smoking with electronic cigarettes (commonly called e-cigarettes). The safety of e-cigarettes is unknown, and some may contain harmful chemicals.  If you relapse, do not give up! Plan ahead and think about what you will do the next  time you get the urge to smoke. HOW WILL I FEEL WHEN I QUIT SMOKING? You may have symptoms of withdrawal because your body is used to nicotine (the addictive substance in cigarettes). You may crave cigarettes, be irritable, feel very hungry, cough often, get headaches, or have difficulty concentrating. The withdrawal symptoms are only temporary. They are strongest when you first quit but will go away within 10-14 days. When withdrawal symptoms occur, stay in control. Think about your reasons for quitting. Remind yourself that these are signs that your body is healing and getting used to being without cigarettes. Remember that withdrawal symptoms are easier to treat than the major diseases that smoking can cause.  Even after the withdrawal is over, expect periodic urges to smoke. However, these cravings are generally short lived and will go away whether you smoke or not. Do not smoke! WHAT RESOURCES ARE AVAILABLE TO HELP ME QUIT SMOKING? Your health care provider can direct you to community resources or hospitals for support, which may include:  Group support.  Education.  Hypnosis.  Therapy.   This information is not intended to replace advice given to you by your health care provider. Make sure you discuss any questions you have with your health care provider.   Document Released: 02/23/2004 Document Revised: 06/17/2014 Document Reviewed: 11/12/2012 Elsevier Interactive Patient Education 2016 Elsevier Inc.   Steps to Quit Smoking  Smoking tobacco can be harmful to your health and can affect almost every organ in your body. Smoking puts you, and those around you, at risk for developing many serious chronic diseases. Quitting smoking is difficult, but it is one of the best things that you can do for your health. It is never too late to quit. WHAT ARE THE BENEFITS OF QUITTING SMOKING? When you quit smoking, you lower your risk of developing serious diseases and conditions, such as:  Lung  cancer or lung disease, such as COPD.  Heart disease.  Stroke.  Heart attack.  Infertility.  Osteoporosis and bone fractures. Additionally, symptoms such as coughing, wheezing, and shortness of breath may get better when you quit. You may also find that you get sick less often because your body is stronger at fighting off colds and infections. If you are pregnant, quitting smoking can help to reduce your chances of having a baby of low birth weight. HOW DO I GET READY TO QUIT? When you decide to quit smoking, create a plan to make sure that you are successful. Before you quit:  Pick a date to quit. Set a date within the   next two weeks to give you time to prepare.  Write down the reasons why you are quitting. Keep this list in places where you will see it often, such as on your bathroom mirror or in your car or wallet.  Identify the people, places, things, and activities that make you want to smoke (triggers) and avoid them. Make sure to take these actions:  Throw away all cigarettes at home, at work, and in your car.  Throw away smoking accessories, such as ashtrays and lighters.  Clean your car and make sure to empty the ashtray.  Clean your home, including curtains and carpets.  Tell your family, friends, and coworkers that you are quitting. Support from your loved ones can make quitting easier.  Talk with your health care provider about your options for quitting smoking.  Find out what treatment options are covered by your health insurance. WHAT STRATEGIES CAN I USE TO QUIT SMOKING?  Talk with your healthcare provider about different strategies to quit smoking. Some strategies include:  Quitting smoking altogether instead of gradually lessening how much you smoke over a period of time. Research shows that quitting "cold turkey" is more successful than gradually quitting.  Attending in-person counseling to help you build problem-solving skills. You are more likely to have  success in quitting if you attend several counseling sessions. Even short sessions of 10 minutes can be effective.  Finding resources and support systems that can help you to quit smoking and remain smoke-free after you quit. These resources are most helpful when you use them often. They can include:  Online chats with a counselor.  Telephone quitlines.  Printed self-help materials.  Support groups or group counseling.  Text messaging programs.  Mobile phone applications.  Taking medicines to help you quit smoking. (If you are pregnant or breastfeeding, talk with your health care provider first.) Some medicines contain nicotine and some do not. Both types of medicines help with cravings, but the medicines that include nicotine help to relieve withdrawal symptoms. Your health care provider may recommend:  Nicotine patches, gum, or lozenges.  Nicotine inhalers or sprays.  Non-nicotine medicine that is taken by mouth. Talk with your health care provider about combining strategies, such as taking medicines while you are also receiving in-person counseling. Using these two strategies together makes you more likely to succeed in quitting than if you used either strategy on its own. If you are pregnant or breastfeeding, talk with your health care provider about finding counseling or other support strategies to quit smoking. Do not take medicine to help you quit smoking unless told to do so by your health care provider. WHAT THINGS CAN I DO TO MAKE IT EASIER TO QUIT? Quitting smoking might feel overwhelming at first, but there is a lot that you can do to make it easier. Take these important actions:  Reach out to your family and friends and ask that they support and encourage you during this time. Call telephone quitlines, reach out to support groups, or work with a counselor for support.  Ask people who smoke to avoid smoking around you.  Avoid places that trigger you to smoke, such as bars,  parties, or smoke-break areas at work.  Spend time around people who do not smoke.  Lessen stress in your life, because stress can be a smoking trigger for some people. To lessen stress, try:  Exercising regularly.  Deep-breathing exercises.  Yoga.  Meditating.  Performing a body scan. This involves closing your eyes, scanning   your body from head to toe, and noticing which parts of your body are particularly tense. Purposefully relax the muscles in those areas.  Download or purchase mobile phone or tablet apps (applications) that can help you stick to your quit plan by providing reminders, tips, and encouragement. There are many free apps, such as QuitGuide from the CDC (Centers for Disease Control and Prevention). You can find other support for quitting smoking (smoking cessation) through smokefree.gov and other websites. HOW WILL I FEEL WHEN I QUIT SMOKING? Within the first 24 hours of quitting smoking, you may start to feel some withdrawal symptoms. These symptoms are usually most noticeable 2-3 days after quitting, but they usually do not last beyond 2-3 weeks. Changes or symptoms that you might experience include:  Mood swings.  Restlessness, anxiety, or irritation.  Difficulty concentrating.  Dizziness.  Strong cravings for sugary foods in addition to nicotine.  Mild weight gain.  Constipation.  Nausea.  Coughing or a sore throat.  Changes in how your medicines work in your body.  A depressed mood.  Difficulty sleeping (insomnia). After the first 2-3 weeks of quitting, you may start to notice more positive results, such as:  Improved sense of smell and taste.  Decreased coughing and sore throat.  Slower heart rate.  Lower blood pressure.  Clearer skin.  The ability to breathe more easily.  Fewer sick days. Quitting smoking is very challenging for most people. Do not get discouraged if you are not successful the first time. Some people need to make many  attempts to quit before they achieve long-term success. Do your best to stick to your quit plan, and talk with your health care provider if you have any questions or concerns.   This information is not intended to replace advice given to you by your health care provider. Make sure you discuss any questions you have with your health care provider.   Document Released: 05/21/2001 Document Revised: 10/11/2014 Document Reviewed: 10/11/2014 Elsevier Interactive Patient Education 2016 Elsevier Inc.   
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
## 2016-01-15 ENCOUNTER — Other Ambulatory Visit (HOSPITAL_COMMUNITY): Payer: Medicare PPO

## 2016-01-15 ENCOUNTER — Ambulatory Visit: Payer: Medicare PPO | Admitting: Surgery

## 2016-01-31 ENCOUNTER — Encounter: Payer: Self-pay | Admitting: Surgery

## 2016-02-05 ENCOUNTER — Ambulatory Visit (HOSPITAL_COMMUNITY)
Admission: RE | Admit: 2016-02-05 | Discharge: 2016-02-05 | Disposition: A | Discharge status: Home / Self Care | Payer: Medicare PPO | Source: Ambulatory Visit | Attending: Surgery | Admitting: Surgery

## 2016-02-05 ENCOUNTER — Encounter: Payer: Self-pay | Admitting: Surgery

## 2016-02-05 ENCOUNTER — Ambulatory Visit (INDEPENDENT_AMBULATORY_CARE_PROVIDER_SITE_OTHER): Payer: Medicare PPO | Admitting: Surgery

## 2016-02-05 VITALS — BP 140/82 | HR 62 | Temp 98.4°F | Resp 16 | Ht 70.0 in | Wt 188.0 lb

## 2016-02-05 DIAGNOSIS — Z48812 Encounter for surgical aftercare following surgery on the circulatory system: Secondary | ICD-10-CM

## 2016-02-05 DIAGNOSIS — Z4889 Encounter for other specified surgical aftercare: Secondary | ICD-10-CM | POA: Diagnosis not present

## 2016-02-05 DIAGNOSIS — Z95828 Presence of other vascular implants and grafts: Secondary | ICD-10-CM | POA: Insufficient documentation

## 2016-02-05 DIAGNOSIS — I714 Abdominal aortic aneurysm, without rupture, unspecified: Secondary | ICD-10-CM

## 2016-02-05 NOTE — Progress Notes (Signed)
[facility]in Specialist of [place]  Patient name: Richard Townsend MRN: [id number] DOB: 10/25/1940 Sex: male  REASON FOR VISIT: Follow up  HPI: The patient is back today for followup. He is status post endovascular repair of a symptomatic abdominal aortic aneurysm on 11/28/2011.  He has been followed with surveillance of his carotid as well as his aneurysm.  He was last seen in February 2017.  At that time, he was reluctant to return for further surveillance, as he stated he did not want any further procedures done.  Today the patient has minimal complaints.  He does have some bloating in his epigastric area.  He takes Tums and Zantac as well as Pepto-Bismol.  He states that his joints limit his activity.  He denies claudication symptoms.  He also denies any signs or symptoms of stroke or TIA.  Past Medical History:  Diagnosis Date  . AAA (abdominal aortic aneurysm) ([place]) 11/2011  . Arthritis   . GERD (gastroesophageal reflux disease)   . Headache(784.0)   . Hypertension   . Shortness of breath     Family History  Problem Relation Age of Onset  . Cancer Mother   . Hypertension Mother   . Cancer Father   . Cancer Sister   . Hypertension Sister   . Hypertension Brother   . Hypertension Daughter     SOCIAL HISTORY: Social History  Substance Use Topics  . Smoking status: Current Every Day Smoker    Packs/day: 1.50    Years: 51.00    Types: Cigarettes  . Smokeless tobacco: Never Used  . Alcohol use No    No Known Allergies  Current Outpatient Prescriptions  Medication Sig Dispense Refill  . acetaminophen (TYLENOL ARTHRITIS PAIN) 650 MG CR tablet Take 650 mg by mouth every 8 (eight) hours as needed.    Marland Kitchen allopurinol (ZYLOPRIM) 300 MG tablet Take 1 tablet by mouth daily.    Marland Kitchen ALPRAZolam (XANAX) 0.25 MG tablet Take 1 tablet by mouth as needed.    Marland Kitchen amLODipine (NORVASC) 5 MG tablet Take 10 mg by mouth every morning.     .  Aspirin-Salicylamide-Caffeine (BC HEADACHE POWDER PO) Take 1 packet by mouth 2 (two) times daily as needed. For headache    . beta carotene w/minerals (OCUVITE) tablet Take 1 tablet by mouth daily.    Marland Kitchen HYDROcodone-acetaminophen (VICODIN) 5-500 MG per tablet Take 0.5 tablets by mouth 2 (two) times daily as needed. For pain    . promethazine-codeine (PHENERGAN WITH CODEINE) 6.25-10 MG/5ML syrup Take 5 mLs by mouth daily as needed.    . traMADol (ULTRAM) 50 MG tablet     . valsartan (DIOVAN) 320 MG tablet Take 320 mg by mouth every morning.     No current facility-administered medications for this visit.     REVIEW OF SYSTEMS:  [X]  denotes positive finding, [ ]  denotes negative finding Cardiac  Comments:  Chest pain or chest pressure:    Shortness of breath upon exertion:    Short of breath when lying flat:    Irregular heart rhythm:        Vascular    Pain in calf, thigh, or hip brought on by ambulation:    Pain in feet at night that wakes you up from your sleep:     Blood clot in your veins:    Leg swelling:         Pulmonary    Oxygen at home:    Productive cough:  Wheezing:         Neurologic    Sudden weakness in arms or legs:  x   Sudden numbness in arms or legs:  x   Sudden onset of difficulty speaking or slurred speech:    Temporary loss of vision in one eye:  x Almost totally blind  Problems with dizziness:         Gastrointestinal    Blood in stool:     Vomited blood:         Genitourinary    Burning when urinating:     Blood in urine:        Psychiatric    Major depression:         Hematologic    Bleeding problems:    Problems with blood clotting too easily:        Skin    Rashes or ulcers:        Constitutional    Fever or chills:      PHYSICAL EXAM: Vitals:   02/05/16 0914  BP: 140/82  Pulse: 62  Resp: 16  Temp: 98.4 F (36.9 C)  TempSrc: Oral  SpO2: 99%  Weight: 188 lb (85.3 kg)  Height: 5\' 10"  (1.778 m)    GENERAL: The patient is  a well-nourished male, in no acute distress. The vital signs are documented above. CARDIAC: There is a regular rate and rhythm.  VASCULAR: no carotid bruits.  Pedal pulses not palpable PULMONARY: There is good air exchange bilaterally without wheezing or rales. ABDOMEN: Soft and non-tender with normal pitched bowel sounds.  MUSCULOSKELETAL: There are no major deformities or cyanosis. NEUROLOGIC: No focal weakness or paresthesias are detected. SKIN: There are no ulcers or rashes noted. PSYCHIATRIC: The patient has a normal affect.  DATA:  I have reviewed his ultrasound.  There was limited visualization secondary to bowel gas, however maximum diameter was 4.9 cm.  This is smaller than his previous measurement of 5.0.  MEDICAL ISSUES: Abdominal aortic aneurysm: The patient is status post symptomatic endovascular aneurysm repair.  Surveillance imaging has not shown a significant increase in the size of his aneurysm.  There is no evidence of endoleak.  I will continue with annual surveillance.  Carotid stenosis: The patient has moderate carotid stenosis which remains asymptomatic.  He refused his carotid duplex for today.  I stressed the importance of having this followed.  When he returns in 1 year, I will repeat his carotid duplex.    Annamarie Major, MD [facility] and Vein Specialists of [facility] [phone] Pager [phone]

## 2016-02-06 ENCOUNTER — Other Ambulatory Visit: Payer: Self-pay | Admitting: Surgery

## 2016-02-06 DIAGNOSIS — I6529 Occlusion and stenosis of unspecified carotid artery: Secondary | ICD-10-CM

## 2016-02-06 DIAGNOSIS — I714 Abdominal aortic aneurysm, without rupture, unspecified: Secondary | ICD-10-CM

## 2016-06-11 DIAGNOSIS — F17200 Nicotine dependence, unspecified, uncomplicated: Secondary | ICD-10-CM | POA: Diagnosis not present

## 2016-06-11 DIAGNOSIS — I10 Essential (primary) hypertension: Secondary | ICD-10-CM | POA: Diagnosis not present

## 2016-06-11 DIAGNOSIS — N200 Calculus of kidney: Secondary | ICD-10-CM | POA: Diagnosis not present

## 2016-06-11 DIAGNOSIS — M159 Polyosteoarthritis, unspecified: Secondary | ICD-10-CM | POA: Diagnosis not present

## 2016-06-11 DIAGNOSIS — R110 Nausea: Secondary | ICD-10-CM | POA: Diagnosis not present

## 2016-06-11 DIAGNOSIS — K219 Gastro-esophageal reflux disease without esophagitis: Secondary | ICD-10-CM | POA: Diagnosis not present

## 2016-06-11 DIAGNOSIS — I714 Abdominal aortic aneurysm, without rupture: Secondary | ICD-10-CM | POA: Diagnosis not present

## 2016-06-11 DIAGNOSIS — E7800 Pure hypercholesterolemia, unspecified: Secondary | ICD-10-CM | POA: Diagnosis not present

## 2016-06-11 DIAGNOSIS — E8881 Metabolic syndrome: Secondary | ICD-10-CM | POA: Diagnosis not present

## 2016-07-10 DIAGNOSIS — M109 Gout, unspecified: Secondary | ICD-10-CM | POA: Diagnosis not present

## 2016-07-10 DIAGNOSIS — N200 Calculus of kidney: Secondary | ICD-10-CM | POA: Diagnosis not present

## 2016-07-10 DIAGNOSIS — E7800 Pure hypercholesterolemia, unspecified: Secondary | ICD-10-CM | POA: Diagnosis not present

## 2016-07-10 DIAGNOSIS — I714 Abdominal aortic aneurysm, without rupture: Secondary | ICD-10-CM | POA: Diagnosis not present

## 2016-07-10 DIAGNOSIS — J309 Allergic rhinitis, unspecified: Secondary | ICD-10-CM | POA: Diagnosis not present

## 2016-07-10 DIAGNOSIS — M159 Polyosteoarthritis, unspecified: Secondary | ICD-10-CM | POA: Diagnosis not present

## 2016-07-10 DIAGNOSIS — K219 Gastro-esophageal reflux disease without esophagitis: Secondary | ICD-10-CM | POA: Diagnosis not present

## 2016-07-10 DIAGNOSIS — F411 Generalized anxiety disorder: Secondary | ICD-10-CM | POA: Diagnosis not present

## 2016-07-10 DIAGNOSIS — I10 Essential (primary) hypertension: Secondary | ICD-10-CM | POA: Diagnosis not present

## 2016-08-07 DIAGNOSIS — Z1389 Encounter for screening for other disorder: Secondary | ICD-10-CM | POA: Diagnosis not present

## 2016-08-07 DIAGNOSIS — N200 Calculus of kidney: Secondary | ICD-10-CM | POA: Diagnosis not present

## 2016-08-07 DIAGNOSIS — Z79891 Long term (current) use of opiate analgesic: Secondary | ICD-10-CM | POA: Diagnosis not present

## 2016-08-07 DIAGNOSIS — J309 Allergic rhinitis, unspecified: Secondary | ICD-10-CM | POA: Diagnosis not present

## 2016-08-07 DIAGNOSIS — I714 Abdominal aortic aneurysm, without rupture: Secondary | ICD-10-CM | POA: Diagnosis not present

## 2016-08-07 DIAGNOSIS — E7800 Pure hypercholesterolemia, unspecified: Secondary | ICD-10-CM | POA: Diagnosis not present

## 2016-08-07 DIAGNOSIS — I10 Essential (primary) hypertension: Secondary | ICD-10-CM | POA: Diagnosis not present

## 2016-08-07 DIAGNOSIS — K219 Gastro-esophageal reflux disease without esophagitis: Secondary | ICD-10-CM | POA: Diagnosis not present

## 2016-08-07 DIAGNOSIS — M159 Polyosteoarthritis, unspecified: Secondary | ICD-10-CM | POA: Diagnosis not present

## 2016-09-11 DIAGNOSIS — J309 Allergic rhinitis, unspecified: Secondary | ICD-10-CM | POA: Diagnosis not present

## 2016-09-11 DIAGNOSIS — E7800 Pure hypercholesterolemia, unspecified: Secondary | ICD-10-CM | POA: Diagnosis not present

## 2016-09-11 DIAGNOSIS — N200 Calculus of kidney: Secondary | ICD-10-CM | POA: Diagnosis not present

## 2016-09-11 DIAGNOSIS — I714 Abdominal aortic aneurysm, without rupture: Secondary | ICD-10-CM | POA: Diagnosis not present

## 2016-09-11 DIAGNOSIS — R110 Nausea: Secondary | ICD-10-CM | POA: Diagnosis not present

## 2016-09-11 DIAGNOSIS — I10 Essential (primary) hypertension: Secondary | ICD-10-CM | POA: Diagnosis not present

## 2016-09-11 DIAGNOSIS — F411 Generalized anxiety disorder: Secondary | ICD-10-CM | POA: Diagnosis not present

## 2016-09-11 DIAGNOSIS — M159 Polyosteoarthritis, unspecified: Secondary | ICD-10-CM | POA: Diagnosis not present

## 2016-09-11 DIAGNOSIS — K219 Gastro-esophageal reflux disease without esophagitis: Secondary | ICD-10-CM | POA: Diagnosis not present

## 2016-10-09 DIAGNOSIS — N200 Calculus of kidney: Secondary | ICD-10-CM | POA: Diagnosis not present

## 2016-10-09 DIAGNOSIS — K219 Gastro-esophageal reflux disease without esophagitis: Secondary | ICD-10-CM | POA: Diagnosis not present

## 2016-10-09 DIAGNOSIS — R110 Nausea: Secondary | ICD-10-CM | POA: Diagnosis not present

## 2016-10-09 DIAGNOSIS — F411 Generalized anxiety disorder: Secondary | ICD-10-CM | POA: Diagnosis not present

## 2016-10-09 DIAGNOSIS — M159 Polyosteoarthritis, unspecified: Secondary | ICD-10-CM | POA: Diagnosis not present

## 2016-10-09 DIAGNOSIS — I10 Essential (primary) hypertension: Secondary | ICD-10-CM | POA: Diagnosis not present

## 2016-10-09 DIAGNOSIS — E7800 Pure hypercholesterolemia, unspecified: Secondary | ICD-10-CM | POA: Diagnosis not present

## 2016-10-09 DIAGNOSIS — J309 Allergic rhinitis, unspecified: Secondary | ICD-10-CM | POA: Diagnosis not present

## 2016-10-09 DIAGNOSIS — I714 Abdominal aortic aneurysm, without rupture: Secondary | ICD-10-CM | POA: Diagnosis not present

## 2016-11-12 DIAGNOSIS — I10 Essential (primary) hypertension: Secondary | ICD-10-CM | POA: Diagnosis not present

## 2016-11-12 DIAGNOSIS — K219 Gastro-esophageal reflux disease without esophagitis: Secondary | ICD-10-CM | POA: Diagnosis not present

## 2016-11-12 DIAGNOSIS — I714 Abdominal aortic aneurysm, without rupture: Secondary | ICD-10-CM | POA: Diagnosis not present

## 2016-11-12 DIAGNOSIS — R110 Nausea: Secondary | ICD-10-CM | POA: Diagnosis not present

## 2016-11-12 DIAGNOSIS — E7800 Pure hypercholesterolemia, unspecified: Secondary | ICD-10-CM | POA: Diagnosis not present

## 2016-11-12 DIAGNOSIS — Z139 Encounter for screening, unspecified: Secondary | ICD-10-CM | POA: Diagnosis not present

## 2016-11-12 DIAGNOSIS — F411 Generalized anxiety disorder: Secondary | ICD-10-CM | POA: Diagnosis not present

## 2016-11-12 DIAGNOSIS — J309 Allergic rhinitis, unspecified: Secondary | ICD-10-CM | POA: Diagnosis not present

## 2016-11-12 DIAGNOSIS — N200 Calculus of kidney: Secondary | ICD-10-CM | POA: Diagnosis not present

## 2017-01-07 DIAGNOSIS — K219 Gastro-esophageal reflux disease without esophagitis: Secondary | ICD-10-CM | POA: Diagnosis not present

## 2017-01-07 DIAGNOSIS — R110 Nausea: Secondary | ICD-10-CM | POA: Diagnosis not present

## 2017-01-07 DIAGNOSIS — N200 Calculus of kidney: Secondary | ICD-10-CM | POA: Diagnosis not present

## 2017-01-07 DIAGNOSIS — M159 Polyosteoarthritis, unspecified: Secondary | ICD-10-CM | POA: Diagnosis not present

## 2017-01-07 DIAGNOSIS — J309 Allergic rhinitis, unspecified: Secondary | ICD-10-CM | POA: Diagnosis not present

## 2017-01-07 DIAGNOSIS — I714 Abdominal aortic aneurysm, without rupture: Secondary | ICD-10-CM | POA: Diagnosis not present

## 2017-01-07 DIAGNOSIS — E8881 Metabolic syndrome: Secondary | ICD-10-CM | POA: Diagnosis not present

## 2017-01-07 DIAGNOSIS — I10 Essential (primary) hypertension: Secondary | ICD-10-CM | POA: Diagnosis not present

## 2017-01-07 DIAGNOSIS — F411 Generalized anxiety disorder: Secondary | ICD-10-CM | POA: Diagnosis not present

## 2017-01-07 DIAGNOSIS — E7800 Pure hypercholesterolemia, unspecified: Secondary | ICD-10-CM | POA: Diagnosis not present

## 2017-02-05 ENCOUNTER — Encounter: Payer: Self-pay | Admitting: Surgery

## 2017-02-17 ENCOUNTER — Encounter: Payer: Self-pay | Admitting: Surgery

## 2017-02-17 ENCOUNTER — Ambulatory Visit (INDEPENDENT_AMBULATORY_CARE_PROVIDER_SITE_OTHER): Payer: Medicare HMO | Admitting: Surgery

## 2017-02-17 ENCOUNTER — Ambulatory Visit (HOSPITAL_COMMUNITY)
Admission: RE | Admit: 2017-02-17 | Discharge: 2017-02-17 | Disposition: A | Discharge status: Home / Self Care | Payer: Medicare HMO | Source: Ambulatory Visit | Attending: Surgery | Admitting: Surgery

## 2017-02-17 ENCOUNTER — Ambulatory Visit (INDEPENDENT_AMBULATORY_CARE_PROVIDER_SITE_OTHER)
Admission: RE | Admit: 2017-02-17 | Discharge: 2017-02-17 | Disposition: A | Discharge status: Home / Self Care | Payer: Medicare HMO | Source: Ambulatory Visit | Attending: Surgery | Admitting: Surgery

## 2017-02-17 VITALS — BP 135/77 | HR 70 | Temp 97.0°F | Resp 16 | Ht 70.0 in | Wt 190.0 lb

## 2017-02-17 DIAGNOSIS — I714 Abdominal aortic aneurysm, without rupture, unspecified: Secondary | ICD-10-CM

## 2017-02-17 DIAGNOSIS — I6523 Occlusion and stenosis of bilateral carotid arteries: Secondary | ICD-10-CM | POA: Diagnosis not present

## 2017-02-17 DIAGNOSIS — I6529 Occlusion and stenosis of unspecified carotid artery: Secondary | ICD-10-CM | POA: Insufficient documentation

## 2017-02-17 LAB — VAS US CAROTID
LEFT ECA DIAS: -10 cm/s
Left CCA dist dias: -15 cm/s
Left CCA dist sys: -65 cm/s
Left CCA prox dias: 18 cm/s
Left CCA prox sys: 114 cm/s
Left ICA dist dias: -31 cm/s
Left ICA dist sys: -107 cm/s
RIGHT CCA MID DIAS: 11 cm/s
RIGHT ECA DIAS: -8 cm/s
RIGHT VERTEBRAL DIAS: 18 cm/s
Right CCA prox dias: 11 cm/s
Right CCA prox sys: 94 cm/s
Right cca dist sys: -61 cm/s

## 2017-02-17 NOTE — Progress Notes (Signed)
Vascular and Vein Specialist of [place]  Patient name: Richard Townsend MRN: [id number] DOB: 30-Mar-1941 Sex: male   REASON FOR VISIT:    Follow up  HISOTRY OF PRESENT ILLNESS:   The patient is back today for followup. He is status post endovascular repair of a symptomatic abdominal aortic aneurysm on 11/28/2011.  He has been followed with surveillance of his carotid as well as his aneurysm.  He was last seen in February 2017.  At that time, he was reluctant to return for further surveillance, as he stated he did not want any further procedures done.  He denies any neurologic symptoms today.  He does have arthritis in his hands and gout flaring up and his heel.  He is concerned about a hernia and his abdomen.  He has pain that starts in his back and goes down to the back of both legs   PAST MEDICAL HISTORY:   Past Medical History:  Diagnosis Date  . AAA (abdominal aortic aneurysm) ([place]) 11/2011  . Arthritis   . GERD (gastroesophageal reflux disease)   . Headache(784.0)   . Hypertension   . Shortness of breath      FAMILY HISTORY:   Family History  Problem Relation Age of Onset  . Cancer Mother   . Hypertension Mother   . Cancer Father   . Cancer Sister   . Hypertension Sister   . Hypertension Brother   . Hypertension Daughter     SOCIAL HISTORY:   Social History  Substance Use Topics  . Smoking status: Current Every Day Smoker    Packs/day: 1.50    Years: 51.00    Types: Cigarettes  . Smokeless tobacco: Never Used  . Alcohol use No     ALLERGIES:   No Known Allergies   CURRENT MEDICATIONS:   Current Outpatient Prescriptions  Medication Sig Dispense Refill  . allopurinol (ZYLOPRIM) 300 MG tablet Take 1 tablet by mouth daily.    Marland Kitchen amLODipine (NORVASC) 5 MG tablet Take 10 mg by mouth every morning.     . Aspirin-Salicylamide-Caffeine (BC HEADACHE POWDER PO) Take 1 packet by mouth 2 (two) times daily as needed. For  headache    . atorvastatin (LIPITOR) 40 MG tablet Take 40 mg by mouth daily.    . beta carotene w/minerals (OCUVITE) tablet Take 1 tablet by mouth daily.    . Glucosamine-Chondroit-Vit C-Mn (GLUCOSAMINE 1500 COMPLEX) CAPS Take by mouth.    Marland Kitchen HYDROcodone-acetaminophen (VICODIN) 5-500 MG per tablet Take 0.5 tablets by mouth 2 (two) times daily as needed. For pain    . losartan (COZAAR) 100 MG tablet Take 100 mg by mouth daily.    . ondansetron (ZOFRAN) 24 MG tablet Take 24 mg by mouth once.    . traMADol (ULTRAM) 50 MG tablet      No current facility-administered medications for this visit.     REVIEW OF SYSTEMS:   [X]  denotes positive finding, [ ]  denotes negative finding Cardiac  Comments:  Chest pain or chest pressure:    Shortness of breath upon exertion:    Short of breath when lying flat:    Irregular heart rhythm:        Vascular    Pain in calf, thigh, or hip brought on by ambulation: x   Pain in feet at night that wakes you up from your sleep:  x   Blood clot in your veins:    Leg swelling:         Pulmonary  Oxygen at home:    Productive cough:     Wheezing:         Neurologic    Sudden weakness in arms or legs:     Sudden numbness in arms or legs:     Sudden onset of difficulty speaking or slurred speech:    Temporary loss of vision in one eye:     Problems with dizziness:         Gastrointestinal    Blood in stool:     Vomited blood:         Genitourinary    Burning when urinating:     Blood in urine:        Psychiatric    Major depression:         Hematologic    Bleeding problems:    Problems with blood clotting too easily:        Skin    Rashes or ulcers:        Constitutional    Fever or chills:      PHYSICAL EXAM:   Vitals:   02/17/17 1026  BP: 135/77  Pulse: 70  Resp: 16  Temp: (!) 97 F (36.1 C)  TempSrc: Oral  SpO2: 98%  Weight: 190 lb (86.2 kg)  Height: 5\' 10"  (1.778 m)    GENERAL: The patient is a well-nourished male, in  no acute distress. The vital signs are documented above. CARDIAC: There is a regular rate and rhythm.  PULMONARY: Non-labored respirations ABDOMEN: Soft.  He does have diastases MUSCULOSKELETAL: There are no major deformities or cyanosis. NEUROLOGIC: No focal weakness or paresthesias are detected. SKIN: There are no ulcers or rashes noted. PSYCHIATRIC: The patient has a normal affect.  STUDIES:   I have ordered and reviewed the following studies:  AAA: No change in aneurysm sac measuring 4.9 cm  Carotid: 60-79 percent right-sided stenosis, 40-59% left  MEDICAL ISSUES:   AAA: The patient's aneurysm remains stable.  I will repeat a surveillance ultrasound in one year  Carotid stenosis: The patient is asymptomatic.  He has had progression of the right side, now in the 60-79 percent category.  I will have him follow-up in 1 year.  He will contact me if he develops symptoms.  Diastases recti: I discussed with the patient that I do not feel a hernia.  I do not think this needs to be repaired, however if he notices a bulge or has severe pain he noticed to have this looked at.     Annamarie Major, MD Vascular and Vein Specialists of [facility] [phone] Pager [phone]

## 2017-03-04 DIAGNOSIS — I714 Abdominal aortic aneurysm, without rupture: Secondary | ICD-10-CM | POA: Diagnosis not present

## 2017-03-04 DIAGNOSIS — K219 Gastro-esophageal reflux disease without esophagitis: Secondary | ICD-10-CM | POA: Diagnosis not present

## 2017-03-04 DIAGNOSIS — M159 Polyosteoarthritis, unspecified: Secondary | ICD-10-CM | POA: Diagnosis not present

## 2017-03-04 DIAGNOSIS — E8881 Metabolic syndrome: Secondary | ICD-10-CM | POA: Diagnosis not present

## 2017-03-04 DIAGNOSIS — E7800 Pure hypercholesterolemia, unspecified: Secondary | ICD-10-CM | POA: Diagnosis not present

## 2017-03-04 DIAGNOSIS — N200 Calculus of kidney: Secondary | ICD-10-CM | POA: Diagnosis not present

## 2017-03-04 DIAGNOSIS — Z23 Encounter for immunization: Secondary | ICD-10-CM | POA: Diagnosis not present

## 2017-03-04 DIAGNOSIS — R110 Nausea: Secondary | ICD-10-CM | POA: Diagnosis not present

## 2017-03-04 DIAGNOSIS — J309 Allergic rhinitis, unspecified: Secondary | ICD-10-CM | POA: Diagnosis not present

## 2017-03-12 NOTE — Addendum Note (Signed)
Addended by: Lianne Cure A on: 03/12/2017 12:39 PM   Modules accepted: Orders

## 2017-04-22 DIAGNOSIS — M159 Polyosteoarthritis, unspecified: Secondary | ICD-10-CM | POA: Diagnosis not present

## 2017-04-22 DIAGNOSIS — E8881 Metabolic syndrome: Secondary | ICD-10-CM | POA: Diagnosis not present

## 2017-04-22 DIAGNOSIS — E7800 Pure hypercholesterolemia, unspecified: Secondary | ICD-10-CM | POA: Diagnosis not present

## 2017-04-22 DIAGNOSIS — I10 Essential (primary) hypertension: Secondary | ICD-10-CM | POA: Diagnosis not present

## 2017-04-22 DIAGNOSIS — Z9181 History of falling: Secondary | ICD-10-CM | POA: Diagnosis not present

## 2017-04-22 DIAGNOSIS — J309 Allergic rhinitis, unspecified: Secondary | ICD-10-CM | POA: Diagnosis not present

## 2017-04-22 DIAGNOSIS — I714 Abdominal aortic aneurysm, without rupture: Secondary | ICD-10-CM | POA: Diagnosis not present

## 2017-04-22 DIAGNOSIS — R110 Nausea: Secondary | ICD-10-CM | POA: Diagnosis not present

## 2017-04-22 DIAGNOSIS — J208 Acute bronchitis due to other specified organisms: Secondary | ICD-10-CM | POA: Diagnosis not present

## 2017-04-22 DIAGNOSIS — K219 Gastro-esophageal reflux disease without esophagitis: Secondary | ICD-10-CM | POA: Diagnosis not present

## 2017-05-16 DIAGNOSIS — Z1331 Encounter for screening for depression: Secondary | ICD-10-CM | POA: Diagnosis not present

## 2017-05-16 DIAGNOSIS — E663 Overweight: Secondary | ICD-10-CM | POA: Diagnosis not present

## 2017-05-16 DIAGNOSIS — Z125 Encounter for screening for malignant neoplasm of prostate: Secondary | ICD-10-CM | POA: Diagnosis not present

## 2017-05-16 DIAGNOSIS — Z9181 History of falling: Secondary | ICD-10-CM | POA: Diagnosis not present

## 2017-05-16 DIAGNOSIS — E785 Hyperlipidemia, unspecified: Secondary | ICD-10-CM | POA: Diagnosis not present

## 2017-05-16 DIAGNOSIS — Z23 Encounter for immunization: Secondary | ICD-10-CM | POA: Diagnosis not present

## 2017-05-16 DIAGNOSIS — Z1211 Encounter for screening for malignant neoplasm of colon: Secondary | ICD-10-CM | POA: Diagnosis not present

## 2017-05-16 DIAGNOSIS — Z6829 Body mass index (BMI) 29.0-29.9, adult: Secondary | ICD-10-CM | POA: Diagnosis not present

## 2017-05-16 DIAGNOSIS — Z0000 Encounter for general adult medical examination without abnormal findings: Secondary | ICD-10-CM | POA: Diagnosis not present

## 2017-06-17 DIAGNOSIS — M159 Polyosteoarthritis, unspecified: Secondary | ICD-10-CM | POA: Diagnosis not present

## 2017-06-17 DIAGNOSIS — J309 Allergic rhinitis, unspecified: Secondary | ICD-10-CM | POA: Diagnosis not present

## 2017-06-17 DIAGNOSIS — R110 Nausea: Secondary | ICD-10-CM | POA: Diagnosis not present

## 2017-06-17 DIAGNOSIS — F411 Generalized anxiety disorder: Secondary | ICD-10-CM | POA: Diagnosis not present

## 2017-06-17 DIAGNOSIS — I10 Essential (primary) hypertension: Secondary | ICD-10-CM | POA: Diagnosis not present

## 2017-06-17 DIAGNOSIS — N200 Calculus of kidney: Secondary | ICD-10-CM | POA: Diagnosis not present

## 2017-06-17 DIAGNOSIS — I714 Abdominal aortic aneurysm, without rupture: Secondary | ICD-10-CM | POA: Diagnosis not present

## 2017-06-17 DIAGNOSIS — K219 Gastro-esophageal reflux disease without esophagitis: Secondary | ICD-10-CM | POA: Diagnosis not present

## 2017-06-17 DIAGNOSIS — E7800 Pure hypercholesterolemia, unspecified: Secondary | ICD-10-CM | POA: Diagnosis not present

## 2017-07-21 DIAGNOSIS — Z1212 Encounter for screening for malignant neoplasm of rectum: Secondary | ICD-10-CM | POA: Diagnosis not present

## 2017-07-21 DIAGNOSIS — Z1211 Encounter for screening for malignant neoplasm of colon: Secondary | ICD-10-CM | POA: Diagnosis not present

## 2017-08-12 DIAGNOSIS — M25561 Pain in right knee: Secondary | ICD-10-CM | POA: Diagnosis not present

## 2017-08-12 DIAGNOSIS — Z1331 Encounter for screening for depression: Secondary | ICD-10-CM | POA: Diagnosis not present

## 2017-08-12 DIAGNOSIS — E7800 Pure hypercholesterolemia, unspecified: Secondary | ICD-10-CM | POA: Diagnosis not present

## 2017-08-12 DIAGNOSIS — K219 Gastro-esophageal reflux disease without esophagitis: Secondary | ICD-10-CM | POA: Diagnosis not present

## 2017-08-12 DIAGNOSIS — F411 Generalized anxiety disorder: Secondary | ICD-10-CM | POA: Diagnosis not present

## 2017-08-12 DIAGNOSIS — J309 Allergic rhinitis, unspecified: Secondary | ICD-10-CM | POA: Diagnosis not present

## 2017-08-12 DIAGNOSIS — I714 Abdominal aortic aneurysm, without rupture: Secondary | ICD-10-CM | POA: Diagnosis not present

## 2017-08-12 DIAGNOSIS — R110 Nausea: Secondary | ICD-10-CM | POA: Diagnosis not present

## 2017-08-12 DIAGNOSIS — M159 Polyosteoarthritis, unspecified: Secondary | ICD-10-CM | POA: Diagnosis not present

## 2017-10-07 DIAGNOSIS — F411 Generalized anxiety disorder: Secondary | ICD-10-CM | POA: Diagnosis not present

## 2017-10-07 DIAGNOSIS — N200 Calculus of kidney: Secondary | ICD-10-CM | POA: Diagnosis not present

## 2017-10-07 DIAGNOSIS — I714 Abdominal aortic aneurysm, without rupture: Secondary | ICD-10-CM | POA: Diagnosis not present

## 2017-10-07 DIAGNOSIS — R110 Nausea: Secondary | ICD-10-CM | POA: Diagnosis not present

## 2017-10-07 DIAGNOSIS — K219 Gastro-esophageal reflux disease without esophagitis: Secondary | ICD-10-CM | POA: Diagnosis not present

## 2017-10-07 DIAGNOSIS — J309 Allergic rhinitis, unspecified: Secondary | ICD-10-CM | POA: Diagnosis not present

## 2017-10-07 DIAGNOSIS — I10 Essential (primary) hypertension: Secondary | ICD-10-CM | POA: Diagnosis not present

## 2017-10-07 DIAGNOSIS — M159 Polyosteoarthritis, unspecified: Secondary | ICD-10-CM | POA: Diagnosis not present

## 2017-10-07 DIAGNOSIS — E7800 Pure hypercholesterolemia, unspecified: Secondary | ICD-10-CM | POA: Diagnosis not present

## 2017-10-27 DIAGNOSIS — N200 Calculus of kidney: Secondary | ICD-10-CM | POA: Diagnosis not present

## 2017-10-27 DIAGNOSIS — R110 Nausea: Secondary | ICD-10-CM | POA: Diagnosis not present

## 2017-10-27 DIAGNOSIS — F17200 Nicotine dependence, unspecified, uncomplicated: Secondary | ICD-10-CM | POA: Diagnosis not present

## 2017-10-27 DIAGNOSIS — J208 Acute bronchitis due to other specified organisms: Secondary | ICD-10-CM | POA: Diagnosis not present

## 2017-10-27 DIAGNOSIS — I714 Abdominal aortic aneurysm, without rupture: Secondary | ICD-10-CM | POA: Diagnosis not present

## 2017-10-27 DIAGNOSIS — E8881 Metabolic syndrome: Secondary | ICD-10-CM | POA: Diagnosis not present

## 2017-10-27 DIAGNOSIS — M159 Polyosteoarthritis, unspecified: Secondary | ICD-10-CM | POA: Diagnosis not present

## 2017-10-27 DIAGNOSIS — E7800 Pure hypercholesterolemia, unspecified: Secondary | ICD-10-CM | POA: Diagnosis not present

## 2017-10-27 DIAGNOSIS — K219 Gastro-esophageal reflux disease without esophagitis: Secondary | ICD-10-CM | POA: Diagnosis not present

## 2017-12-04 DIAGNOSIS — F17200 Nicotine dependence, unspecified, uncomplicated: Secondary | ICD-10-CM | POA: Diagnosis not present

## 2017-12-04 DIAGNOSIS — M159 Polyosteoarthritis, unspecified: Secondary | ICD-10-CM | POA: Diagnosis not present

## 2017-12-04 DIAGNOSIS — K219 Gastro-esophageal reflux disease without esophagitis: Secondary | ICD-10-CM | POA: Diagnosis not present

## 2017-12-04 DIAGNOSIS — E8881 Metabolic syndrome: Secondary | ICD-10-CM | POA: Diagnosis not present

## 2017-12-04 DIAGNOSIS — I714 Abdominal aortic aneurysm, without rupture: Secondary | ICD-10-CM | POA: Diagnosis not present

## 2017-12-04 DIAGNOSIS — F411 Generalized anxiety disorder: Secondary | ICD-10-CM | POA: Diagnosis not present

## 2017-12-04 DIAGNOSIS — R110 Nausea: Secondary | ICD-10-CM | POA: Diagnosis not present

## 2017-12-04 DIAGNOSIS — Z1339 Encounter for screening examination for other mental health and behavioral disorders: Secondary | ICD-10-CM | POA: Diagnosis not present

## 2017-12-04 DIAGNOSIS — N200 Calculus of kidney: Secondary | ICD-10-CM | POA: Diagnosis not present

## 2018-01-08 DIAGNOSIS — F17200 Nicotine dependence, unspecified, uncomplicated: Secondary | ICD-10-CM | POA: Diagnosis not present

## 2018-01-08 DIAGNOSIS — I10 Essential (primary) hypertension: Secondary | ICD-10-CM | POA: Diagnosis not present

## 2018-01-08 DIAGNOSIS — I714 Abdominal aortic aneurysm, without rupture: Secondary | ICD-10-CM | POA: Diagnosis not present

## 2018-01-08 DIAGNOSIS — E7800 Pure hypercholesterolemia, unspecified: Secondary | ICD-10-CM | POA: Diagnosis not present

## 2018-01-08 DIAGNOSIS — E8881 Metabolic syndrome: Secondary | ICD-10-CM | POA: Diagnosis not present

## 2018-01-08 DIAGNOSIS — K219 Gastro-esophageal reflux disease without esophagitis: Secondary | ICD-10-CM | POA: Diagnosis not present

## 2018-01-08 DIAGNOSIS — F411 Generalized anxiety disorder: Secondary | ICD-10-CM | POA: Diagnosis not present

## 2018-01-08 DIAGNOSIS — M159 Polyosteoarthritis, unspecified: Secondary | ICD-10-CM | POA: Diagnosis not present

## 2018-01-08 DIAGNOSIS — R110 Nausea: Secondary | ICD-10-CM | POA: Diagnosis not present

## 2018-01-08 DIAGNOSIS — N200 Calculus of kidney: Secondary | ICD-10-CM | POA: Diagnosis not present

## 2018-01-08 DIAGNOSIS — M109 Gout, unspecified: Secondary | ICD-10-CM | POA: Diagnosis not present

## 2018-03-05 DIAGNOSIS — K219 Gastro-esophageal reflux disease without esophagitis: Secondary | ICD-10-CM | POA: Diagnosis not present

## 2018-03-05 DIAGNOSIS — N200 Calculus of kidney: Secondary | ICD-10-CM | POA: Diagnosis not present

## 2018-03-05 DIAGNOSIS — E8881 Metabolic syndrome: Secondary | ICD-10-CM | POA: Diagnosis not present

## 2018-03-05 DIAGNOSIS — M159 Polyosteoarthritis, unspecified: Secondary | ICD-10-CM | POA: Diagnosis not present

## 2018-03-05 DIAGNOSIS — R110 Nausea: Secondary | ICD-10-CM | POA: Diagnosis not present

## 2018-03-05 DIAGNOSIS — F411 Generalized anxiety disorder: Secondary | ICD-10-CM | POA: Diagnosis not present

## 2018-03-05 DIAGNOSIS — J309 Allergic rhinitis, unspecified: Secondary | ICD-10-CM | POA: Diagnosis not present

## 2018-03-05 DIAGNOSIS — F17200 Nicotine dependence, unspecified, uncomplicated: Secondary | ICD-10-CM | POA: Diagnosis not present

## 2018-03-05 DIAGNOSIS — I714 Abdominal aortic aneurysm, without rupture: Secondary | ICD-10-CM | POA: Diagnosis not present

## 2018-04-30 DIAGNOSIS — M159 Polyosteoarthritis, unspecified: Secondary | ICD-10-CM | POA: Diagnosis not present

## 2018-04-30 DIAGNOSIS — K219 Gastro-esophageal reflux disease without esophagitis: Secondary | ICD-10-CM | POA: Diagnosis not present

## 2018-04-30 DIAGNOSIS — E8881 Metabolic syndrome: Secondary | ICD-10-CM | POA: Diagnosis not present

## 2018-04-30 DIAGNOSIS — N200 Calculus of kidney: Secondary | ICD-10-CM | POA: Diagnosis not present

## 2018-04-30 DIAGNOSIS — J208 Acute bronchitis due to other specified organisms: Secondary | ICD-10-CM | POA: Diagnosis not present

## 2018-04-30 DIAGNOSIS — F17200 Nicotine dependence, unspecified, uncomplicated: Secondary | ICD-10-CM | POA: Diagnosis not present

## 2018-04-30 DIAGNOSIS — F411 Generalized anxiety disorder: Secondary | ICD-10-CM | POA: Diagnosis not present

## 2018-04-30 DIAGNOSIS — R110 Nausea: Secondary | ICD-10-CM | POA: Diagnosis not present

## 2018-04-30 DIAGNOSIS — I714 Abdominal aortic aneurysm, without rupture: Secondary | ICD-10-CM | POA: Diagnosis not present

## 2018-05-25 ENCOUNTER — Encounter (HOSPITAL_COMMUNITY): Payer: Medicare HMO

## 2018-05-25 ENCOUNTER — Other Ambulatory Visit (HOSPITAL_COMMUNITY): Payer: Medicare HMO

## 2018-05-25 ENCOUNTER — Ambulatory Visit: Payer: Medicare HMO | Admitting: Surgery

## 2018-05-28 DIAGNOSIS — K219 Gastro-esophageal reflux disease without esophagitis: Secondary | ICD-10-CM | POA: Diagnosis not present

## 2018-05-28 DIAGNOSIS — N200 Calculus of kidney: Secondary | ICD-10-CM | POA: Diagnosis not present

## 2018-05-28 DIAGNOSIS — R110 Nausea: Secondary | ICD-10-CM | POA: Diagnosis not present

## 2018-05-28 DIAGNOSIS — E7800 Pure hypercholesterolemia, unspecified: Secondary | ICD-10-CM | POA: Diagnosis not present

## 2018-05-28 DIAGNOSIS — I10 Essential (primary) hypertension: Secondary | ICD-10-CM | POA: Diagnosis not present

## 2018-05-28 DIAGNOSIS — Z23 Encounter for immunization: Secondary | ICD-10-CM | POA: Diagnosis not present

## 2018-05-28 DIAGNOSIS — I714 Abdominal aortic aneurysm, without rupture: Secondary | ICD-10-CM | POA: Diagnosis not present

## 2018-05-28 DIAGNOSIS — M25561 Pain in right knee: Secondary | ICD-10-CM | POA: Diagnosis not present

## 2018-05-28 DIAGNOSIS — M159 Polyosteoarthritis, unspecified: Secondary | ICD-10-CM | POA: Diagnosis not present

## 2018-05-28 DIAGNOSIS — F411 Generalized anxiety disorder: Secondary | ICD-10-CM | POA: Diagnosis not present

## 2018-05-28 DIAGNOSIS — F17200 Nicotine dependence, unspecified, uncomplicated: Secondary | ICD-10-CM | POA: Diagnosis not present

## 2018-06-11 DIAGNOSIS — I714 Abdominal aortic aneurysm, without rupture: Secondary | ICD-10-CM | POA: Diagnosis not present

## 2018-06-11 DIAGNOSIS — K219 Gastro-esophageal reflux disease without esophagitis: Secondary | ICD-10-CM | POA: Diagnosis not present

## 2018-06-11 DIAGNOSIS — M159 Polyosteoarthritis, unspecified: Secondary | ICD-10-CM | POA: Diagnosis not present

## 2018-06-11 DIAGNOSIS — R110 Nausea: Secondary | ICD-10-CM | POA: Diagnosis not present

## 2018-06-11 DIAGNOSIS — F17200 Nicotine dependence, unspecified, uncomplicated: Secondary | ICD-10-CM | POA: Diagnosis not present

## 2018-06-11 DIAGNOSIS — E8881 Metabolic syndrome: Secondary | ICD-10-CM | POA: Diagnosis not present

## 2018-06-11 DIAGNOSIS — N200 Calculus of kidney: Secondary | ICD-10-CM | POA: Diagnosis not present

## 2018-06-11 DIAGNOSIS — F411 Generalized anxiety disorder: Secondary | ICD-10-CM | POA: Diagnosis not present

## 2018-06-11 DIAGNOSIS — J0100 Acute maxillary sinusitis, unspecified: Secondary | ICD-10-CM | POA: Diagnosis not present

## 2018-06-22 ENCOUNTER — Encounter (HOSPITAL_COMMUNITY): Payer: Medicare HMO

## 2018-06-22 ENCOUNTER — Ambulatory Visit: Payer: Medicare HMO | Admitting: Surgery

## 2018-06-22 ENCOUNTER — Other Ambulatory Visit (HOSPITAL_COMMUNITY): Payer: Medicare HMO

## 2018-06-23 DIAGNOSIS — L821 Other seborrheic keratosis: Secondary | ICD-10-CM | POA: Diagnosis not present

## 2018-06-23 DIAGNOSIS — C44622 Squamous cell carcinoma of skin of right upper limb, including shoulder: Secondary | ICD-10-CM | POA: Diagnosis not present

## 2018-06-23 DIAGNOSIS — D485 Neoplasm of uncertain behavior of skin: Secondary | ICD-10-CM | POA: Diagnosis not present

## 2018-07-02 DIAGNOSIS — I714 Abdominal aortic aneurysm, without rupture: Secondary | ICD-10-CM | POA: Diagnosis not present

## 2018-07-02 DIAGNOSIS — E8881 Metabolic syndrome: Secondary | ICD-10-CM | POA: Diagnosis not present

## 2018-07-02 DIAGNOSIS — N200 Calculus of kidney: Secondary | ICD-10-CM | POA: Diagnosis not present

## 2018-07-02 DIAGNOSIS — M159 Polyosteoarthritis, unspecified: Secondary | ICD-10-CM | POA: Diagnosis not present

## 2018-07-02 DIAGNOSIS — R110 Nausea: Secondary | ICD-10-CM | POA: Diagnosis not present

## 2018-07-02 DIAGNOSIS — F17200 Nicotine dependence, unspecified, uncomplicated: Secondary | ICD-10-CM | POA: Diagnosis not present

## 2018-07-02 DIAGNOSIS — F411 Generalized anxiety disorder: Secondary | ICD-10-CM | POA: Diagnosis not present

## 2018-07-02 DIAGNOSIS — M109 Gout, unspecified: Secondary | ICD-10-CM | POA: Diagnosis not present

## 2018-07-02 DIAGNOSIS — K219 Gastro-esophageal reflux disease without esophagitis: Secondary | ICD-10-CM | POA: Diagnosis not present

## 2018-09-03 DIAGNOSIS — R110 Nausea: Secondary | ICD-10-CM | POA: Diagnosis not present

## 2018-09-03 DIAGNOSIS — J208 Acute bronchitis due to other specified organisms: Secondary | ICD-10-CM | POA: Diagnosis not present

## 2018-09-03 DIAGNOSIS — I714 Abdominal aortic aneurysm, without rupture: Secondary | ICD-10-CM | POA: Diagnosis not present

## 2018-09-03 DIAGNOSIS — E7800 Pure hypercholesterolemia, unspecified: Secondary | ICD-10-CM | POA: Diagnosis not present

## 2018-09-03 DIAGNOSIS — I10 Essential (primary) hypertension: Secondary | ICD-10-CM | POA: Diagnosis not present

## 2018-09-03 DIAGNOSIS — M159 Polyosteoarthritis, unspecified: Secondary | ICD-10-CM | POA: Diagnosis not present

## 2018-09-03 DIAGNOSIS — N200 Calculus of kidney: Secondary | ICD-10-CM | POA: Diagnosis not present

## 2018-09-03 DIAGNOSIS — K219 Gastro-esophageal reflux disease without esophagitis: Secondary | ICD-10-CM | POA: Diagnosis not present

## 2018-09-03 DIAGNOSIS — F17200 Nicotine dependence, unspecified, uncomplicated: Secondary | ICD-10-CM | POA: Diagnosis not present

## 2018-09-03 DIAGNOSIS — E8881 Metabolic syndrome: Secondary | ICD-10-CM | POA: Diagnosis not present

## 2018-09-03 DIAGNOSIS — F411 Generalized anxiety disorder: Secondary | ICD-10-CM | POA: Diagnosis not present

## 2018-10-02 DIAGNOSIS — I714 Abdominal aortic aneurysm, without rupture: Secondary | ICD-10-CM | POA: Diagnosis not present

## 2018-10-02 DIAGNOSIS — Z139 Encounter for screening, unspecified: Secondary | ICD-10-CM | POA: Diagnosis not present

## 2018-10-02 DIAGNOSIS — R110 Nausea: Secondary | ICD-10-CM | POA: Diagnosis not present

## 2018-10-02 DIAGNOSIS — N200 Calculus of kidney: Secondary | ICD-10-CM | POA: Diagnosis not present

## 2018-10-02 DIAGNOSIS — Z9181 History of falling: Secondary | ICD-10-CM | POA: Diagnosis not present

## 2018-10-02 DIAGNOSIS — M159 Polyosteoarthritis, unspecified: Secondary | ICD-10-CM | POA: Diagnosis not present

## 2018-10-02 DIAGNOSIS — F411 Generalized anxiety disorder: Secondary | ICD-10-CM | POA: Diagnosis not present

## 2018-10-02 DIAGNOSIS — K219 Gastro-esophageal reflux disease without esophagitis: Secondary | ICD-10-CM | POA: Diagnosis not present

## 2018-10-02 DIAGNOSIS — Z1331 Encounter for screening for depression: Secondary | ICD-10-CM | POA: Diagnosis not present

## 2018-10-30 DIAGNOSIS — E7800 Pure hypercholesterolemia, unspecified: Secondary | ICD-10-CM | POA: Diagnosis not present

## 2018-10-30 DIAGNOSIS — K219 Gastro-esophageal reflux disease without esophagitis: Secondary | ICD-10-CM | POA: Diagnosis not present

## 2018-10-30 DIAGNOSIS — N200 Calculus of kidney: Secondary | ICD-10-CM | POA: Diagnosis not present

## 2018-10-30 DIAGNOSIS — R110 Nausea: Secondary | ICD-10-CM | POA: Diagnosis not present

## 2018-10-30 DIAGNOSIS — F17200 Nicotine dependence, unspecified, uncomplicated: Secondary | ICD-10-CM | POA: Diagnosis not present

## 2018-10-30 DIAGNOSIS — E8881 Metabolic syndrome: Secondary | ICD-10-CM | POA: Diagnosis not present

## 2018-10-30 DIAGNOSIS — F411 Generalized anxiety disorder: Secondary | ICD-10-CM | POA: Diagnosis not present

## 2018-10-30 DIAGNOSIS — I714 Abdominal aortic aneurysm, without rupture: Secondary | ICD-10-CM | POA: Diagnosis not present

## 2018-10-30 DIAGNOSIS — M159 Polyosteoarthritis, unspecified: Secondary | ICD-10-CM | POA: Diagnosis not present

## 2018-12-10 DIAGNOSIS — E7800 Pure hypercholesterolemia, unspecified: Secondary | ICD-10-CM | POA: Diagnosis not present

## 2018-12-10 DIAGNOSIS — M7022 Olecranon bursitis, left elbow: Secondary | ICD-10-CM | POA: Diagnosis not present

## 2018-12-10 DIAGNOSIS — E8881 Metabolic syndrome: Secondary | ICD-10-CM | POA: Diagnosis not present

## 2018-12-10 DIAGNOSIS — M159 Polyosteoarthritis, unspecified: Secondary | ICD-10-CM | POA: Diagnosis not present

## 2018-12-10 DIAGNOSIS — F411 Generalized anxiety disorder: Secondary | ICD-10-CM | POA: Diagnosis not present

## 2018-12-10 DIAGNOSIS — N200 Calculus of kidney: Secondary | ICD-10-CM | POA: Diagnosis not present

## 2018-12-10 DIAGNOSIS — I714 Abdominal aortic aneurysm, without rupture: Secondary | ICD-10-CM | POA: Diagnosis not present

## 2018-12-10 DIAGNOSIS — R110 Nausea: Secondary | ICD-10-CM | POA: Diagnosis not present

## 2018-12-10 DIAGNOSIS — M25422 Effusion, left elbow: Secondary | ICD-10-CM | POA: Diagnosis not present

## 2018-12-10 DIAGNOSIS — K219 Gastro-esophageal reflux disease without esophagitis: Secondary | ICD-10-CM | POA: Diagnosis not present

## 2019-01-29 DIAGNOSIS — E7800 Pure hypercholesterolemia, unspecified: Secondary | ICD-10-CM | POA: Diagnosis not present

## 2019-01-29 DIAGNOSIS — M109 Gout, unspecified: Secondary | ICD-10-CM | POA: Diagnosis not present

## 2019-01-29 DIAGNOSIS — K219 Gastro-esophageal reflux disease without esophagitis: Secondary | ICD-10-CM | POA: Diagnosis not present

## 2019-01-29 DIAGNOSIS — I714 Abdominal aortic aneurysm, without rupture: Secondary | ICD-10-CM | POA: Diagnosis not present

## 2019-01-29 DIAGNOSIS — M7022 Olecranon bursitis, left elbow: Secondary | ICD-10-CM | POA: Diagnosis not present

## 2019-01-29 DIAGNOSIS — N200 Calculus of kidney: Secondary | ICD-10-CM | POA: Diagnosis not present

## 2019-01-29 DIAGNOSIS — E8881 Metabolic syndrome: Secondary | ICD-10-CM | POA: Diagnosis not present

## 2019-01-29 DIAGNOSIS — F17200 Nicotine dependence, unspecified, uncomplicated: Secondary | ICD-10-CM | POA: Diagnosis not present

## 2019-01-29 DIAGNOSIS — R110 Nausea: Secondary | ICD-10-CM | POA: Diagnosis not present

## 2019-02-04 DIAGNOSIS — M109 Gout, unspecified: Secondary | ICD-10-CM | POA: Diagnosis not present

## 2019-02-04 DIAGNOSIS — K219 Gastro-esophageal reflux disease without esophagitis: Secondary | ICD-10-CM | POA: Diagnosis not present

## 2019-02-04 DIAGNOSIS — N200 Calculus of kidney: Secondary | ICD-10-CM | POA: Diagnosis not present

## 2019-02-04 DIAGNOSIS — M7022 Olecranon bursitis, left elbow: Secondary | ICD-10-CM | POA: Diagnosis not present

## 2019-02-04 DIAGNOSIS — E7800 Pure hypercholesterolemia, unspecified: Secondary | ICD-10-CM | POA: Diagnosis not present

## 2019-02-04 DIAGNOSIS — F17200 Nicotine dependence, unspecified, uncomplicated: Secondary | ICD-10-CM | POA: Diagnosis not present

## 2019-02-04 DIAGNOSIS — R110 Nausea: Secondary | ICD-10-CM | POA: Diagnosis not present

## 2019-02-04 DIAGNOSIS — E8881 Metabolic syndrome: Secondary | ICD-10-CM | POA: Diagnosis not present

## 2019-02-04 DIAGNOSIS — I714 Abdominal aortic aneurysm, without rupture: Secondary | ICD-10-CM | POA: Diagnosis not present

## 2019-02-11 DIAGNOSIS — F17200 Nicotine dependence, unspecified, uncomplicated: Secondary | ICD-10-CM | POA: Diagnosis not present

## 2019-02-11 DIAGNOSIS — M25422 Effusion, left elbow: Secondary | ICD-10-CM | POA: Diagnosis not present

## 2019-02-11 DIAGNOSIS — I10 Essential (primary) hypertension: Secondary | ICD-10-CM | POA: Diagnosis not present

## 2019-02-11 DIAGNOSIS — K219 Gastro-esophageal reflux disease without esophagitis: Secondary | ICD-10-CM | POA: Diagnosis not present

## 2019-02-11 DIAGNOSIS — M109 Gout, unspecified: Secondary | ICD-10-CM | POA: Diagnosis not present

## 2019-02-11 DIAGNOSIS — M7022 Olecranon bursitis, left elbow: Secondary | ICD-10-CM | POA: Diagnosis not present

## 2019-02-11 DIAGNOSIS — R110 Nausea: Secondary | ICD-10-CM | POA: Diagnosis not present

## 2019-02-11 DIAGNOSIS — N200 Calculus of kidney: Secondary | ICD-10-CM | POA: Diagnosis not present

## 2019-02-11 DIAGNOSIS — I714 Abdominal aortic aneurysm, without rupture: Secondary | ICD-10-CM | POA: Diagnosis not present

## 2019-02-11 DIAGNOSIS — E7800 Pure hypercholesterolemia, unspecified: Secondary | ICD-10-CM | POA: Diagnosis not present

## 2019-02-11 DIAGNOSIS — E8881 Metabolic syndrome: Secondary | ICD-10-CM | POA: Diagnosis not present

## 2019-02-28 ENCOUNTER — Inpatient Hospital Stay
Admission: AD | Admit: 2019-02-28 | Payer: Medicare HMO | Source: Other Acute Inpatient Hospital | Admitting: Gastroenterology

## 2019-02-28 DIAGNOSIS — R109 Unspecified abdominal pain: Secondary | ICD-10-CM | POA: Diagnosis not present

## 2019-02-28 DIAGNOSIS — D62 Acute posthemorrhagic anemia: Secondary | ICD-10-CM | POA: Diagnosis not present

## 2019-02-28 DIAGNOSIS — R52 Pain, unspecified: Secondary | ICD-10-CM | POA: Diagnosis not present

## 2019-02-28 DIAGNOSIS — E7800 Pure hypercholesterolemia, unspecified: Secondary | ICD-10-CM | POA: Diagnosis not present

## 2019-02-28 DIAGNOSIS — Z03818 Encounter for observation for suspected exposure to other biological agents ruled out: Secondary | ICD-10-CM | POA: Diagnosis not present

## 2019-02-28 DIAGNOSIS — I350 Nonrheumatic aortic (valve) stenosis: Secondary | ICD-10-CM | POA: Diagnosis not present

## 2019-02-28 DIAGNOSIS — R4182 Altered mental status, unspecified: Secondary | ICD-10-CM | POA: Diagnosis not present

## 2019-02-28 DIAGNOSIS — K264 Chronic or unspecified duodenal ulcer with hemorrhage: Secondary | ICD-10-CM | POA: Diagnosis not present

## 2019-02-28 DIAGNOSIS — G9341 Metabolic encephalopathy: Secondary | ICD-10-CM | POA: Diagnosis not present

## 2019-02-28 DIAGNOSIS — I358 Other nonrheumatic aortic valve disorders: Secondary | ICD-10-CM | POA: Diagnosis not present

## 2019-02-28 DIAGNOSIS — J189 Pneumonia, unspecified organism: Secondary | ICD-10-CM | POA: Diagnosis not present

## 2019-02-28 DIAGNOSIS — J9691 Respiratory failure, unspecified with hypoxia: Secondary | ICD-10-CM | POA: Diagnosis not present

## 2019-02-28 DIAGNOSIS — I10 Essential (primary) hypertension: Secondary | ICD-10-CM | POA: Diagnosis not present

## 2019-02-28 DIAGNOSIS — S199XXA Unspecified injury of neck, initial encounter: Secondary | ICD-10-CM | POA: Diagnosis not present

## 2019-02-28 DIAGNOSIS — I82412 Acute embolism and thrombosis of left femoral vein: Secondary | ICD-10-CM | POA: Diagnosis not present

## 2019-02-28 DIAGNOSIS — R571 Hypovolemic shock: Secondary | ICD-10-CM | POA: Diagnosis not present

## 2019-02-28 DIAGNOSIS — S3991XA Unspecified injury of abdomen, initial encounter: Secondary | ICD-10-CM | POA: Diagnosis not present

## 2019-02-28 DIAGNOSIS — Z743 Need for continuous supervision: Secondary | ICD-10-CM | POA: Diagnosis not present

## 2019-02-28 DIAGNOSIS — D649 Anemia, unspecified: Secondary | ICD-10-CM | POA: Diagnosis not present

## 2019-02-28 DIAGNOSIS — Z4682 Encounter for fitting and adjustment of non-vascular catheter: Secondary | ICD-10-CM | POA: Diagnosis not present

## 2019-02-28 DIAGNOSIS — E872 Acidosis: Secondary | ICD-10-CM | POA: Diagnosis not present

## 2019-02-28 DIAGNOSIS — F17210 Nicotine dependence, cigarettes, uncomplicated: Secondary | ICD-10-CM | POA: Diagnosis not present

## 2019-02-28 DIAGNOSIS — J690 Pneumonitis due to inhalation of food and vomit: Secondary | ICD-10-CM | POA: Diagnosis not present

## 2019-02-28 DIAGNOSIS — N179 Acute kidney failure, unspecified: Secondary | ICD-10-CM | POA: Diagnosis not present

## 2019-02-28 DIAGNOSIS — I469 Cardiac arrest, cause unspecified: Secondary | ICD-10-CM | POA: Diagnosis not present

## 2019-02-28 DIAGNOSIS — I82452 Acute embolism and thrombosis of left peroneal vein: Secondary | ICD-10-CM | POA: Diagnosis not present

## 2019-02-28 DIAGNOSIS — R578 Other shock: Secondary | ICD-10-CM | POA: Diagnosis not present

## 2019-02-28 DIAGNOSIS — J9601 Acute respiratory failure with hypoxia: Secondary | ICD-10-CM | POA: Diagnosis not present

## 2019-02-28 DIAGNOSIS — S0990XA Unspecified injury of head, initial encounter: Secondary | ICD-10-CM | POA: Diagnosis not present

## 2019-02-28 DIAGNOSIS — S299XXA Unspecified injury of thorax, initial encounter: Secondary | ICD-10-CM | POA: Diagnosis not present

## 2019-02-28 DIAGNOSIS — I499 Cardiac arrhythmia, unspecified: Secondary | ICD-10-CM | POA: Diagnosis not present

## 2019-02-28 DIAGNOSIS — I82409 Acute embolism and thrombosis of unspecified deep veins of unspecified lower extremity: Secondary | ICD-10-CM | POA: Diagnosis not present

## 2019-02-28 DIAGNOSIS — R0602 Shortness of breath: Secondary | ICD-10-CM | POA: Diagnosis not present

## 2019-02-28 DIAGNOSIS — K922 Gastrointestinal hemorrhage, unspecified: Secondary | ICD-10-CM | POA: Diagnosis not present

## 2019-02-28 DIAGNOSIS — R0603 Acute respiratory distress: Secondary | ICD-10-CM | POA: Diagnosis not present

## 2019-02-28 DIAGNOSIS — R1084 Generalized abdominal pain: Secondary | ICD-10-CM | POA: Diagnosis not present

## 2019-02-28 DIAGNOSIS — R404 Transient alteration of awareness: Secondary | ICD-10-CM | POA: Diagnosis not present

## 2019-02-28 DIAGNOSIS — I517 Cardiomegaly: Secondary | ICD-10-CM | POA: Diagnosis not present

## 2019-02-28 DIAGNOSIS — J449 Chronic obstructive pulmonary disease, unspecified: Secondary | ICD-10-CM | POA: Diagnosis not present

## 2019-03-01 DIAGNOSIS — R578 Other shock: Secondary | ICD-10-CM | POA: Diagnosis not present

## 2019-03-01 DIAGNOSIS — R0602 Shortness of breath: Secondary | ICD-10-CM | POA: Diagnosis not present

## 2019-03-01 DIAGNOSIS — I469 Cardiac arrest, cause unspecified: Secondary | ICD-10-CM | POA: Diagnosis not present

## 2019-03-01 DIAGNOSIS — K922 Gastrointestinal hemorrhage, unspecified: Secondary | ICD-10-CM | POA: Diagnosis not present

## 2019-03-01 DIAGNOSIS — N179 Acute kidney failure, unspecified: Secondary | ICD-10-CM | POA: Diagnosis not present

## 2019-03-01 DIAGNOSIS — E872 Acidosis: Secondary | ICD-10-CM | POA: Diagnosis not present

## 2019-03-01 DIAGNOSIS — Z4682 Encounter for fitting and adjustment of non-vascular catheter: Secondary | ICD-10-CM | POA: Diagnosis not present

## 2019-03-02 DIAGNOSIS — R578 Other shock: Secondary | ICD-10-CM | POA: Diagnosis not present

## 2019-03-02 DIAGNOSIS — D62 Acute posthemorrhagic anemia: Secondary | ICD-10-CM | POA: Diagnosis not present

## 2019-03-02 DIAGNOSIS — K922 Gastrointestinal hemorrhage, unspecified: Secondary | ICD-10-CM | POA: Diagnosis not present

## 2019-03-02 DIAGNOSIS — I469 Cardiac arrest, cause unspecified: Secondary | ICD-10-CM | POA: Diagnosis not present

## 2019-03-02 DIAGNOSIS — E872 Acidosis: Secondary | ICD-10-CM | POA: Diagnosis not present

## 2019-03-02 DIAGNOSIS — N179 Acute kidney failure, unspecified: Secondary | ICD-10-CM | POA: Diagnosis not present

## 2019-03-03 DIAGNOSIS — N179 Acute kidney failure, unspecified: Secondary | ICD-10-CM | POA: Diagnosis not present

## 2019-03-03 DIAGNOSIS — K922 Gastrointestinal hemorrhage, unspecified: Secondary | ICD-10-CM | POA: Diagnosis not present

## 2019-03-03 DIAGNOSIS — D62 Acute posthemorrhagic anemia: Secondary | ICD-10-CM | POA: Diagnosis not present

## 2019-03-03 DIAGNOSIS — E872 Acidosis: Secondary | ICD-10-CM | POA: Diagnosis not present

## 2019-03-03 DIAGNOSIS — R578 Other shock: Secondary | ICD-10-CM | POA: Diagnosis not present

## 2019-03-03 DIAGNOSIS — I469 Cardiac arrest, cause unspecified: Secondary | ICD-10-CM | POA: Diagnosis not present

## 2019-03-04 DIAGNOSIS — J189 Pneumonia, unspecified organism: Secondary | ICD-10-CM | POA: Diagnosis not present

## 2019-03-05 DIAGNOSIS — I469 Cardiac arrest, cause unspecified: Secondary | ICD-10-CM | POA: Diagnosis not present

## 2019-03-05 DIAGNOSIS — N179 Acute kidney failure, unspecified: Secondary | ICD-10-CM | POA: Diagnosis not present

## 2019-03-05 DIAGNOSIS — J690 Pneumonitis due to inhalation of food and vomit: Secondary | ICD-10-CM | POA: Diagnosis not present

## 2019-03-05 DIAGNOSIS — I82452 Acute embolism and thrombosis of left peroneal vein: Secondary | ICD-10-CM | POA: Diagnosis not present

## 2019-03-05 DIAGNOSIS — J9601 Acute respiratory failure with hypoxia: Secondary | ICD-10-CM | POA: Diagnosis not present

## 2019-03-05 DIAGNOSIS — R578 Other shock: Secondary | ICD-10-CM | POA: Diagnosis not present

## 2019-03-05 DIAGNOSIS — G9341 Metabolic encephalopathy: Secondary | ICD-10-CM | POA: Diagnosis not present

## 2019-03-05 DIAGNOSIS — I82412 Acute embolism and thrombosis of left femoral vein: Secondary | ICD-10-CM | POA: Diagnosis not present

## 2019-03-05 DIAGNOSIS — R571 Hypovolemic shock: Secondary | ICD-10-CM | POA: Diagnosis not present

## 2019-03-05 DIAGNOSIS — E872 Acidosis: Secondary | ICD-10-CM | POA: Diagnosis not present

## 2019-03-05 DIAGNOSIS — J9691 Respiratory failure, unspecified with hypoxia: Secondary | ICD-10-CM | POA: Diagnosis not present

## 2019-03-05 DIAGNOSIS — K922 Gastrointestinal hemorrhage, unspecified: Secondary | ICD-10-CM | POA: Diagnosis not present

## 2019-03-05 DIAGNOSIS — K264 Chronic or unspecified duodenal ulcer with hemorrhage: Secondary | ICD-10-CM | POA: Diagnosis not present

## 2019-03-05 DIAGNOSIS — D62 Acute posthemorrhagic anemia: Secondary | ICD-10-CM | POA: Diagnosis not present

## 2019-03-06 DIAGNOSIS — I469 Cardiac arrest, cause unspecified: Secondary | ICD-10-CM | POA: Diagnosis not present

## 2019-03-06 DIAGNOSIS — K922 Gastrointestinal hemorrhage, unspecified: Secondary | ICD-10-CM | POA: Diagnosis not present

## 2019-03-06 DIAGNOSIS — J9691 Respiratory failure, unspecified with hypoxia: Secondary | ICD-10-CM | POA: Diagnosis not present

## 2019-03-06 DIAGNOSIS — N179 Acute kidney failure, unspecified: Secondary | ICD-10-CM | POA: Diagnosis not present

## 2019-03-07 DIAGNOSIS — J9691 Respiratory failure, unspecified with hypoxia: Secondary | ICD-10-CM | POA: Diagnosis not present

## 2019-03-07 DIAGNOSIS — N179 Acute kidney failure, unspecified: Secondary | ICD-10-CM | POA: Diagnosis not present

## 2019-03-08 DIAGNOSIS — K922 Gastrointestinal hemorrhage, unspecified: Secondary | ICD-10-CM | POA: Diagnosis not present

## 2019-03-08 DIAGNOSIS — J9691 Respiratory failure, unspecified with hypoxia: Secondary | ICD-10-CM | POA: Diagnosis not present

## 2019-03-08 DIAGNOSIS — R578 Other shock: Secondary | ICD-10-CM | POA: Diagnosis not present

## 2019-03-08 DIAGNOSIS — E872 Acidosis: Secondary | ICD-10-CM | POA: Diagnosis not present

## 2019-03-08 DIAGNOSIS — N179 Acute kidney failure, unspecified: Secondary | ICD-10-CM | POA: Diagnosis not present

## 2019-03-09 DIAGNOSIS — E872 Acidosis: Secondary | ICD-10-CM | POA: Diagnosis not present

## 2019-03-09 DIAGNOSIS — N179 Acute kidney failure, unspecified: Secondary | ICD-10-CM | POA: Diagnosis not present

## 2019-03-09 DIAGNOSIS — K922 Gastrointestinal hemorrhage, unspecified: Secondary | ICD-10-CM | POA: Diagnosis not present

## 2019-03-09 DIAGNOSIS — I82412 Acute embolism and thrombosis of left femoral vein: Secondary | ICD-10-CM | POA: Diagnosis not present

## 2019-03-09 DIAGNOSIS — I82452 Acute embolism and thrombosis of left peroneal vein: Secondary | ICD-10-CM | POA: Diagnosis not present

## 2019-03-09 DIAGNOSIS — J9691 Respiratory failure, unspecified with hypoxia: Secondary | ICD-10-CM | POA: Diagnosis not present

## 2019-03-09 DIAGNOSIS — I469 Cardiac arrest, cause unspecified: Secondary | ICD-10-CM | POA: Diagnosis not present

## 2019-03-10 DIAGNOSIS — R578 Other shock: Secondary | ICD-10-CM | POA: Diagnosis not present

## 2019-03-10 DIAGNOSIS — I469 Cardiac arrest, cause unspecified: Secondary | ICD-10-CM | POA: Diagnosis not present

## 2019-03-10 DIAGNOSIS — E872 Acidosis: Secondary | ICD-10-CM | POA: Diagnosis not present

## 2019-03-10 DIAGNOSIS — I82409 Acute embolism and thrombosis of unspecified deep veins of unspecified lower extremity: Secondary | ICD-10-CM | POA: Diagnosis not present

## 2019-03-10 DIAGNOSIS — N179 Acute kidney failure, unspecified: Secondary | ICD-10-CM | POA: Diagnosis not present

## 2019-03-10 DIAGNOSIS — J9691 Respiratory failure, unspecified with hypoxia: Secondary | ICD-10-CM | POA: Diagnosis not present

## 2019-03-10 DIAGNOSIS — K922 Gastrointestinal hemorrhage, unspecified: Secondary | ICD-10-CM | POA: Diagnosis not present

## 2019-03-11 DIAGNOSIS — K922 Gastrointestinal hemorrhage, unspecified: Secondary | ICD-10-CM | POA: Diagnosis not present

## 2019-03-11 DIAGNOSIS — N179 Acute kidney failure, unspecified: Secondary | ICD-10-CM | POA: Diagnosis not present

## 2019-03-11 DIAGNOSIS — I469 Cardiac arrest, cause unspecified: Secondary | ICD-10-CM | POA: Diagnosis not present

## 2019-03-11 DIAGNOSIS — I82409 Acute embolism and thrombosis of unspecified deep veins of unspecified lower extremity: Secondary | ICD-10-CM | POA: Diagnosis not present

## 2019-03-11 DIAGNOSIS — E872 Acidosis: Secondary | ICD-10-CM | POA: Diagnosis not present

## 2019-03-11 DIAGNOSIS — J9691 Respiratory failure, unspecified with hypoxia: Secondary | ICD-10-CM | POA: Diagnosis not present

## 2019-03-11 DIAGNOSIS — R578 Other shock: Secondary | ICD-10-CM | POA: Diagnosis not present

## 2019-03-12 ENCOUNTER — Other Ambulatory Visit: Payer: Self-pay

## 2019-03-12 NOTE — Patient Outreach (Signed)
[facility]) Care Management  03/12/2019  Richard Townsend [birth year] [id number]     Transition of Care Referral  Referral Date: 03/12/2019 Referral [place] Discharge Report Date of Admission: unknown Diagnosis:GI hemorrhage Date of Discharge:03/11/2019 Facility: [facility]: [employer] PCP does [facility]   Outreach attempt # 1 to patient. Spoke with patient who voices he is doing "wonderful." He states that he very supportive spouse along with daughter and son in law who help him as needed. He denies any pain and/or SOB. He relies on family to take him to appts. He reports that he goes for PCP appt 03/18/2019. He confirms that he has all his meds in the home. He states that he is unable to review meds at this time as his family assists him and they are not home at present. He denies any issues managing/affording meds. [facility] services reviewed and discussed with patient. Patient gave verbal consent for services. He is agreeable to RN CM calling back and following up with him.    Plan: RN CM will make outreach attempt to patient within 2-3 weeks. RN CM will send successful outreach letter to patient.    [name], RN,BSN,CCM [place] Management Telephonic Care Management Coordinator Direct Phone: [phone] Toll Free: [phone] Fax: [phone]

## 2019-03-13 DIAGNOSIS — I10 Essential (primary) hypertension: Secondary | ICD-10-CM | POA: Diagnosis not present

## 2019-03-13 DIAGNOSIS — I714 Abdominal aortic aneurysm, without rupture: Secondary | ICD-10-CM | POA: Diagnosis not present

## 2019-03-13 DIAGNOSIS — I82409 Acute embolism and thrombosis of unspecified deep veins of unspecified lower extremity: Secondary | ICD-10-CM | POA: Diagnosis not present

## 2019-03-13 DIAGNOSIS — K219 Gastro-esophageal reflux disease without esophagitis: Secondary | ICD-10-CM | POA: Diagnosis not present

## 2019-03-13 DIAGNOSIS — K279 Peptic ulcer, site unspecified, unspecified as acute or chronic, without hemorrhage or perforation: Secondary | ICD-10-CM | POA: Diagnosis not present

## 2019-03-13 DIAGNOSIS — K922 Gastrointestinal hemorrhage, unspecified: Secondary | ICD-10-CM | POA: Diagnosis not present

## 2019-03-13 DIAGNOSIS — R600 Localized edema: Secondary | ICD-10-CM | POA: Diagnosis not present

## 2019-03-13 DIAGNOSIS — E669 Obesity, unspecified: Secondary | ICD-10-CM | POA: Diagnosis not present

## 2019-03-13 DIAGNOSIS — N200 Calculus of kidney: Secondary | ICD-10-CM | POA: Diagnosis not present

## 2019-03-19 ENCOUNTER — Inpatient Hospital Stay
Admission: AD | Admit: 2019-03-19 | Payer: Medicare HMO | Source: Other Acute Inpatient Hospital | Admitting: Internal Medicine

## 2019-03-19 DIAGNOSIS — E7800 Pure hypercholesterolemia, unspecified: Secondary | ICD-10-CM | POA: Diagnosis not present

## 2019-03-19 DIAGNOSIS — K219 Gastro-esophageal reflux disease without esophagitis: Secondary | ICD-10-CM | POA: Diagnosis not present

## 2019-03-19 DIAGNOSIS — I110 Hypertensive heart disease with heart failure: Secondary | ICD-10-CM | POA: Diagnosis not present

## 2019-03-19 DIAGNOSIS — R079 Chest pain, unspecified: Secondary | ICD-10-CM | POA: Diagnosis not present

## 2019-03-19 DIAGNOSIS — I714 Abdominal aortic aneurysm, without rupture: Secondary | ICD-10-CM | POA: Diagnosis not present

## 2019-03-19 DIAGNOSIS — Z8701 Personal history of pneumonia (recurrent): Secondary | ICD-10-CM | POA: Diagnosis not present

## 2019-03-19 DIAGNOSIS — I10 Essential (primary) hypertension: Secondary | ICD-10-CM | POA: Diagnosis not present

## 2019-03-19 DIAGNOSIS — I509 Heart failure, unspecified: Secondary | ICD-10-CM | POA: Diagnosis not present

## 2019-03-19 DIAGNOSIS — N200 Calculus of kidney: Secondary | ICD-10-CM | POA: Diagnosis not present

## 2019-03-19 DIAGNOSIS — J439 Emphysema, unspecified: Secondary | ICD-10-CM | POA: Diagnosis not present

## 2019-03-19 DIAGNOSIS — I213 ST elevation (STEMI) myocardial infarction of unspecified site: Secondary | ICD-10-CM | POA: Diagnosis not present

## 2019-03-19 DIAGNOSIS — E8881 Metabolic syndrome: Secondary | ICD-10-CM | POA: Diagnosis not present

## 2019-03-19 DIAGNOSIS — R0602 Shortness of breath: Secondary | ICD-10-CM | POA: Diagnosis not present

## 2019-03-19 DIAGNOSIS — R110 Nausea: Secondary | ICD-10-CM | POA: Diagnosis not present

## 2019-03-19 DIAGNOSIS — K279 Peptic ulcer, site unspecified, unspecified as acute or chronic, without hemorrhage or perforation: Secondary | ICD-10-CM | POA: Diagnosis not present

## 2019-03-19 DIAGNOSIS — Z03818 Encounter for observation for suspected exposure to other biological agents ruled out: Secondary | ICD-10-CM | POA: Diagnosis not present

## 2019-03-19 DIAGNOSIS — I82409 Acute embolism and thrombosis of unspecified deep veins of unspecified lower extremity: Secondary | ICD-10-CM | POA: Diagnosis not present

## 2019-03-19 DIAGNOSIS — R0600 Dyspnea, unspecified: Secondary | ICD-10-CM | POA: Diagnosis not present

## 2019-03-19 DIAGNOSIS — F17210 Nicotine dependence, cigarettes, uncomplicated: Secondary | ICD-10-CM | POA: Diagnosis not present

## 2019-03-19 DIAGNOSIS — K922 Gastrointestinal hemorrhage, unspecified: Secondary | ICD-10-CM | POA: Diagnosis not present

## 2019-03-19 DIAGNOSIS — J90 Pleural effusion, not elsewhere classified: Secondary | ICD-10-CM | POA: Diagnosis not present

## 2019-03-20 DIAGNOSIS — K279 Peptic ulcer, site unspecified, unspecified as acute or chronic, without hemorrhage or perforation: Secondary | ICD-10-CM | POA: Diagnosis not present

## 2019-03-20 DIAGNOSIS — R000 Tachycardia, unspecified: Secondary | ICD-10-CM | POA: Diagnosis not present

## 2019-03-20 DIAGNOSIS — I213 ST elevation (STEMI) myocardial infarction of unspecified site: Secondary | ICD-10-CM | POA: Diagnosis not present

## 2019-03-20 DIAGNOSIS — F17200 Nicotine dependence, unspecified, uncomplicated: Secondary | ICD-10-CM | POA: Diagnosis not present

## 2019-03-20 DIAGNOSIS — D62 Acute posthemorrhagic anemia: Secondary | ICD-10-CM | POA: Diagnosis not present

## 2019-03-20 DIAGNOSIS — I350 Nonrheumatic aortic (valve) stenosis: Secondary | ICD-10-CM | POA: Diagnosis not present

## 2019-03-20 DIAGNOSIS — I428 Other cardiomyopathies: Secondary | ICD-10-CM | POA: Diagnosis not present

## 2019-03-20 DIAGNOSIS — I82402 Acute embolism and thrombosis of unspecified deep veins of left lower extremity: Secondary | ICD-10-CM | POA: Diagnosis not present

## 2019-03-20 DIAGNOSIS — Z8701 Personal history of pneumonia (recurrent): Secondary | ICD-10-CM | POA: Diagnosis not present

## 2019-03-20 DIAGNOSIS — R0689 Other abnormalities of breathing: Secondary | ICD-10-CM | POA: Diagnosis not present

## 2019-03-20 DIAGNOSIS — R279 Unspecified lack of coordination: Secondary | ICD-10-CM | POA: Diagnosis not present

## 2019-03-20 DIAGNOSIS — R0789 Other chest pain: Secondary | ICD-10-CM | POA: Diagnosis not present

## 2019-03-20 DIAGNOSIS — H548 Legal blindness, as defined in USA: Secondary | ICD-10-CM | POA: Diagnosis not present

## 2019-03-20 DIAGNOSIS — I340 Nonrheumatic mitral (valve) insufficiency: Secondary | ICD-10-CM | POA: Diagnosis not present

## 2019-03-20 DIAGNOSIS — I10 Essential (primary) hypertension: Secondary | ICD-10-CM | POA: Diagnosis not present

## 2019-03-20 DIAGNOSIS — I214 Non-ST elevation (NSTEMI) myocardial infarction: Secondary | ICD-10-CM | POA: Diagnosis not present

## 2019-03-20 DIAGNOSIS — R9431 Abnormal electrocardiogram [ECG] [EKG]: Secondary | ICD-10-CM | POA: Diagnosis not present

## 2019-03-20 DIAGNOSIS — I130 Hypertensive heart and chronic kidney disease with heart failure and stage 1 through stage 4 chronic kidney disease, or unspecified chronic kidney disease: Secondary | ICD-10-CM | POA: Diagnosis not present

## 2019-03-20 DIAGNOSIS — I5023 Acute on chronic systolic (congestive) heart failure: Secondary | ICD-10-CM | POA: Diagnosis not present

## 2019-03-20 DIAGNOSIS — I499 Cardiac arrhythmia, unspecified: Secondary | ICD-10-CM | POA: Diagnosis not present

## 2019-03-20 DIAGNOSIS — J90 Pleural effusion, not elsewhere classified: Secondary | ICD-10-CM | POA: Diagnosis not present

## 2019-03-20 DIAGNOSIS — I454 Nonspecific intraventricular block: Secondary | ICD-10-CM | POA: Diagnosis not present

## 2019-03-20 DIAGNOSIS — Z515 Encounter for palliative care: Secondary | ICD-10-CM | POA: Diagnosis not present

## 2019-03-20 DIAGNOSIS — I82409 Acute embolism and thrombosis of unspecified deep veins of unspecified lower extremity: Secondary | ICD-10-CM | POA: Diagnosis not present

## 2019-03-20 DIAGNOSIS — E7800 Pure hypercholesterolemia, unspecified: Secondary | ICD-10-CM | POA: Diagnosis not present

## 2019-03-20 DIAGNOSIS — I5021 Acute systolic (congestive) heart failure: Secondary | ICD-10-CM | POA: Diagnosis not present

## 2019-03-20 DIAGNOSIS — R531 Weakness: Secondary | ICD-10-CM | POA: Diagnosis not present

## 2019-03-20 DIAGNOSIS — Z8711 Personal history of peptic ulcer disease: Secondary | ICD-10-CM | POA: Diagnosis not present

## 2019-03-20 DIAGNOSIS — I2510 Atherosclerotic heart disease of native coronary artery without angina pectoris: Secondary | ICD-10-CM | POA: Diagnosis not present

## 2019-03-20 DIAGNOSIS — F17210 Nicotine dependence, cigarettes, uncomplicated: Secondary | ICD-10-CM | POA: Diagnosis not present

## 2019-03-20 DIAGNOSIS — R079 Chest pain, unspecified: Secondary | ICD-10-CM | POA: Diagnosis not present

## 2019-03-20 DIAGNOSIS — R0602 Shortness of breath: Secondary | ICD-10-CM | POA: Diagnosis not present

## 2019-03-20 DIAGNOSIS — J439 Emphysema, unspecified: Secondary | ICD-10-CM | POA: Diagnosis not present

## 2019-03-20 DIAGNOSIS — I341 Nonrheumatic mitral (valve) prolapse: Secondary | ICD-10-CM | POA: Diagnosis not present

## 2019-03-20 DIAGNOSIS — N189 Chronic kidney disease, unspecified: Secondary | ICD-10-CM | POA: Diagnosis not present

## 2019-03-20 DIAGNOSIS — I21A1 Myocardial infarction type 2: Secondary | ICD-10-CM | POA: Diagnosis not present

## 2019-03-20 DIAGNOSIS — K264 Chronic or unspecified duodenal ulcer with hemorrhage: Secondary | ICD-10-CM | POA: Diagnosis not present

## 2019-03-20 DIAGNOSIS — I509 Heart failure, unspecified: Secondary | ICD-10-CM | POA: Diagnosis not present

## 2019-03-20 DIAGNOSIS — D649 Anemia, unspecified: Secondary | ICD-10-CM | POA: Diagnosis not present

## 2019-03-20 DIAGNOSIS — Z743 Need for continuous supervision: Secondary | ICD-10-CM | POA: Diagnosis not present

## 2019-03-20 DIAGNOSIS — J9601 Acute respiratory failure with hypoxia: Secondary | ICD-10-CM | POA: Diagnosis not present

## 2019-03-20 DIAGNOSIS — R778 Other specified abnormalities of plasma proteins: Secondary | ICD-10-CM | POA: Diagnosis not present

## 2019-03-21 DIAGNOSIS — D649 Anemia, unspecified: Secondary | ICD-10-CM | POA: Diagnosis not present

## 2019-03-21 DIAGNOSIS — J90 Pleural effusion, not elsewhere classified: Secondary | ICD-10-CM | POA: Diagnosis not present

## 2019-03-21 DIAGNOSIS — I10 Essential (primary) hypertension: Secondary | ICD-10-CM | POA: Diagnosis not present

## 2019-03-21 DIAGNOSIS — K279 Peptic ulcer, site unspecified, unspecified as acute or chronic, without hemorrhage or perforation: Secondary | ICD-10-CM | POA: Diagnosis not present

## 2019-03-21 DIAGNOSIS — I82409 Acute embolism and thrombosis of unspecified deep veins of unspecified lower extremity: Secondary | ICD-10-CM | POA: Diagnosis not present

## 2019-03-21 DIAGNOSIS — R079 Chest pain, unspecified: Secondary | ICD-10-CM | POA: Diagnosis not present

## 2019-03-21 DIAGNOSIS — I509 Heart failure, unspecified: Secondary | ICD-10-CM | POA: Diagnosis not present

## 2019-03-21 DIAGNOSIS — R0602 Shortness of breath: Secondary | ICD-10-CM | POA: Diagnosis not present

## 2019-03-21 DIAGNOSIS — R0689 Other abnormalities of breathing: Secondary | ICD-10-CM | POA: Diagnosis not present

## 2019-03-21 DIAGNOSIS — R778 Other specified abnormalities of plasma proteins: Secondary | ICD-10-CM | POA: Diagnosis not present

## 2019-03-22 DIAGNOSIS — J90 Pleural effusion, not elsewhere classified: Secondary | ICD-10-CM | POA: Diagnosis not present

## 2019-03-22 DIAGNOSIS — I82409 Acute embolism and thrombosis of unspecified deep veins of unspecified lower extremity: Secondary | ICD-10-CM | POA: Diagnosis not present

## 2019-03-22 DIAGNOSIS — I10 Essential (primary) hypertension: Secondary | ICD-10-CM | POA: Diagnosis not present

## 2019-03-22 DIAGNOSIS — I509 Heart failure, unspecified: Secondary | ICD-10-CM | POA: Diagnosis not present

## 2019-03-22 DIAGNOSIS — F17200 Nicotine dependence, unspecified, uncomplicated: Secondary | ICD-10-CM | POA: Diagnosis not present

## 2019-03-22 DIAGNOSIS — R778 Other specified abnormalities of plasma proteins: Secondary | ICD-10-CM | POA: Diagnosis not present

## 2019-03-22 DIAGNOSIS — R079 Chest pain, unspecified: Secondary | ICD-10-CM | POA: Diagnosis not present

## 2019-03-22 DIAGNOSIS — K279 Peptic ulcer, site unspecified, unspecified as acute or chronic, without hemorrhage or perforation: Secondary | ICD-10-CM | POA: Diagnosis not present

## 2019-03-22 DIAGNOSIS — D649 Anemia, unspecified: Secondary | ICD-10-CM | POA: Diagnosis not present

## 2019-03-22 DIAGNOSIS — I5021 Acute systolic (congestive) heart failure: Secondary | ICD-10-CM | POA: Diagnosis not present

## 2019-03-22 DIAGNOSIS — R0689 Other abnormalities of breathing: Secondary | ICD-10-CM | POA: Diagnosis not present

## 2019-03-23 DIAGNOSIS — I509 Heart failure, unspecified: Secondary | ICD-10-CM | POA: Diagnosis not present

## 2019-03-23 DIAGNOSIS — J90 Pleural effusion, not elsewhere classified: Secondary | ICD-10-CM | POA: Diagnosis not present

## 2019-03-23 DIAGNOSIS — R778 Other specified abnormalities of plasma proteins: Secondary | ICD-10-CM | POA: Diagnosis not present

## 2019-03-23 DIAGNOSIS — I5021 Acute systolic (congestive) heart failure: Secondary | ICD-10-CM | POA: Diagnosis not present

## 2019-03-23 DIAGNOSIS — R0689 Other abnormalities of breathing: Secondary | ICD-10-CM | POA: Diagnosis not present

## 2019-03-23 DIAGNOSIS — F17200 Nicotine dependence, unspecified, uncomplicated: Secondary | ICD-10-CM | POA: Diagnosis not present

## 2019-03-23 DIAGNOSIS — R079 Chest pain, unspecified: Secondary | ICD-10-CM | POA: Diagnosis not present

## 2019-03-23 DIAGNOSIS — I82409 Acute embolism and thrombosis of unspecified deep veins of unspecified lower extremity: Secondary | ICD-10-CM | POA: Diagnosis not present

## 2019-03-23 DIAGNOSIS — I10 Essential (primary) hypertension: Secondary | ICD-10-CM | POA: Diagnosis not present

## 2019-03-23 DIAGNOSIS — D649 Anemia, unspecified: Secondary | ICD-10-CM | POA: Diagnosis not present

## 2019-03-23 DIAGNOSIS — K279 Peptic ulcer, site unspecified, unspecified as acute or chronic, without hemorrhage or perforation: Secondary | ICD-10-CM | POA: Diagnosis not present

## 2019-03-23 DIAGNOSIS — Z515 Encounter for palliative care: Secondary | ICD-10-CM | POA: Diagnosis not present

## 2019-03-24 DIAGNOSIS — R079 Chest pain, unspecified: Secondary | ICD-10-CM | POA: Diagnosis not present

## 2019-03-24 DIAGNOSIS — I499 Cardiac arrhythmia, unspecified: Secondary | ICD-10-CM | POA: Diagnosis not present

## 2019-03-24 DIAGNOSIS — R9431 Abnormal electrocardiogram [ECG] [EKG]: Secondary | ICD-10-CM | POA: Diagnosis not present

## 2019-03-24 DIAGNOSIS — I454 Nonspecific intraventricular block: Secondary | ICD-10-CM | POA: Diagnosis not present

## 2019-03-24 DIAGNOSIS — K279 Peptic ulcer, site unspecified, unspecified as acute or chronic, without hemorrhage or perforation: Secondary | ICD-10-CM | POA: Diagnosis not present

## 2019-03-24 DIAGNOSIS — R778 Other specified abnormalities of plasma proteins: Secondary | ICD-10-CM | POA: Diagnosis not present

## 2019-03-24 DIAGNOSIS — I5021 Acute systolic (congestive) heart failure: Secondary | ICD-10-CM | POA: Diagnosis not present

## 2019-03-24 DIAGNOSIS — I10 Essential (primary) hypertension: Secondary | ICD-10-CM | POA: Diagnosis not present

## 2019-03-24 DIAGNOSIS — J90 Pleural effusion, not elsewhere classified: Secondary | ICD-10-CM | POA: Diagnosis not present

## 2019-03-24 DIAGNOSIS — I428 Other cardiomyopathies: Secondary | ICD-10-CM | POA: Diagnosis not present

## 2019-03-24 DIAGNOSIS — R0689 Other abnormalities of breathing: Secondary | ICD-10-CM | POA: Diagnosis not present

## 2019-03-24 DIAGNOSIS — D62 Acute posthemorrhagic anemia: Secondary | ICD-10-CM | POA: Diagnosis not present

## 2019-03-25 ENCOUNTER — Other Ambulatory Visit: Payer: Self-pay

## 2019-03-25 NOTE — Patient Outreach (Signed)
[facility]) Care Management  03/25/2019   Richard Townsend 06/06/1941 [id number]  Initial Assessment   Outreach attempt back to patient to check on his recovery since recent discharge form hospital.  Patient pleased to report that he is doing well. He states that he feels like he is getting stronger and better everyday. However, he voices that he can still tell that he is weak. He has very supportive family to assist with his care needs.   Conditions: Per chart review and patient report, patient has PMH that includes but not limited to: HTN, arthritis, GERD, AAA, osteoporosis. 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 He relies on his family to help him out.  Medications: Unable to complete med review with pt. due to vision and reading deficit. Spouse in the home but pt. voiced she was [facility] and would not be able to assist. He states that his daughter Rica Koyanagi in law fill med planner for him and manage his meds. They are both unavailable at present. RN CM left contact info and requested that family member contact RN CM back to complete med review. Med list in EMR system last reviewed in 2018.  Current Medications:  Current Outpatient Medications  Medication Sig Dispense Refill  . allopurinol (ZYLOPRIM) 300 MG tablet Take 1 tablet by mouth daily.    Marland Kitchen amLODipine (NORVASC) 5 MG tablet Take 10 mg by mouth every morning.     . Aspirin-Salicylamide-Caffeine (BC HEADACHE POWDER PO) Take 1 packet by mouth 2 (two) times daily as needed. For headache    . atorvastatin (LIPITOR) 40 MG tablet Take 40 mg by mouth daily.    . beta carotene w/minerals (OCUVITE) tablet Take 1 tablet by mouth daily.    . Glucosamine-Chondroit-Vit C-Mn (GLUCOSAMINE 1500 COMPLEX) CAPS Take by mouth.    Marland Kitchen HYDROcodone-acetaminophen (VICODIN) 5-500 MG per tablet Take 0.5 tablets by mouth 2 (two) times daily as needed. For pain    . losartan (COZAAR) 100 MG tablet Take 100 mg by mouth daily.     . ondansetron (ZOFRAN) 24 MG tablet Take 24 mg by mouth once.    . traMADol (ULTRAM) 50 MG tablet      No current facility-administered medications for this visit.     Functional Status:  In your present state of health, do you have any difficulty performing the following activities: 03/25/2019 03/12/2019  Hearing? - N  Vision? 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 - N  Walking or climbing stairs? - N  Dressing or bathing? - N  Doing errands, shopping? - N  Conservation officer, nature and eating ? - N  Using the Toilet? - N  In the past six months, have you accidently leaked urine? - N  Do you have problems with loss of bowel control? - N  Managing your Medications? - N  Managing your Finances? - N  Housekeeping or managing your Housekeeping? - N  Some recent data might be hidden    Fall/Depression Screening: Fall Risk  03/12/2019  Falls in the past year? 1  Number falls in past yr: 1  Injury with Fall? 1  Risk for fall due to : History of fall(s);Impaired vision;Impaired balance/gait;Impaired mobility  Follow up Falls evaluation completed;Education provided   PHQ 2/9 Scores 03/12/2019  PHQ - 2 Score 0    Assessment:  THN CM Care Plan Problem One     Most Recent  Value  Care Plan Problem One  Patient at high risk for readmission to hospital given co-morbidities.  Role Documenting the Problem One  Care Management Telephonic [place] for Problem One  Active  [facility] Long Term Goal   Patient will have no readmission to the hospital over the next 31 days.  THN Long Term Goal Start Date  03/12/19  Interventions for Problem One Long Term Goal  RN CM assessed pt. for and worsening s/s of condition. RN CM reviewed with pt. when to seek medical attention for worsening symptoms.  THN CM Short Term Goal #1   Patient will complete all post discharge MD follow up appts over the next 30 days.  THN CM Short Term Goal #1 Start Date   03/12/19  Interventions for Short Term Goal #1  RN CM reviewed with pt. appt schedule.     THN CM Care Plan Problem Two     Most Recent Value  Care Plan Problem Two  Knowledge deficit related to disease process and mgmt of HTN  Role Documenting the Problem Two  Care Management Telephonic Coordinator  [facility] CM Short Term Goal #1   Patient will report daily monitoring of BP weight in the home over the next 30 days.  THN CM Short Term Goal #1 Start Date  03/25/19  Interventions for Short Term Goal #2   RN CM confirmed pt has DME in the home. RN CM instructed pt. on importance of monitoring and to alert MD of any abnormal values.  THN CM Short Term Goal #2   Patient will report adhering to low salt diet at least 75-100% of the time over the next 30 days.  THN CM Short Term Goal #2 Start Date  03/25/19  Interventions for Short Term Goal #2  RN CM dsicsused with pt. the importance of low salt diet. RN CM educated pt. on healthy food options.      Plan:  RN CM will make follow up call to patient within a month. RN CM will send barriers letter and route encounter to PCP.   Richard Montgomery, RN,BSN,CCM [facility] Management Telephonic Care Management Coordinator Direct Phone: [phone] Toll Free: [phone] Fax: [phone]

## 2019-03-26 ENCOUNTER — Ambulatory Visit: Payer: Medicare HMO

## 2019-03-29 ENCOUNTER — Other Ambulatory Visit: Payer: Self-pay

## 2019-03-29 NOTE — Patient Outreach (Signed)
[facility]) Care Management  03/29/2019  Richard Townsend 08/30/1940 [id number]     Voicemail message received from patient's son in law-[name] who has patient's med list and wanted to do med review over the phone. Return call placed to son in law. Med review completed. Caregiver denies any issue with patient affording meds. Family is managing patient's meds due to his limited vision. No issues noted.     Plan: RN CM will follow up with patient within one month.    [name], RN,BSN,CCM [place] Management Telephonic Care Management Coordinator Direct Phone: [phone] Toll Free: [phone] Fax: [phone]

## 2019-04-01 DIAGNOSIS — K219 Gastro-esophageal reflux disease without esophagitis: Secondary | ICD-10-CM | POA: Diagnosis not present

## 2019-04-01 DIAGNOSIS — R330 Drug induced retention of urine: Secondary | ICD-10-CM | POA: Diagnosis not present

## 2019-04-01 DIAGNOSIS — F17200 Nicotine dependence, unspecified, uncomplicated: Secondary | ICD-10-CM | POA: Diagnosis not present

## 2019-04-01 DIAGNOSIS — I714 Abdominal aortic aneurysm, without rupture: Secondary | ICD-10-CM | POA: Diagnosis not present

## 2019-04-01 DIAGNOSIS — I2510 Atherosclerotic heart disease of native coronary artery without angina pectoris: Secondary | ICD-10-CM | POA: Diagnosis not present

## 2019-04-01 DIAGNOSIS — E8881 Metabolic syndrome: Secondary | ICD-10-CM | POA: Diagnosis not present

## 2019-04-01 DIAGNOSIS — N200 Calculus of kidney: Secondary | ICD-10-CM | POA: Diagnosis not present

## 2019-04-01 DIAGNOSIS — J449 Chronic obstructive pulmonary disease, unspecified: Secondary | ICD-10-CM | POA: Diagnosis not present

## 2019-04-01 DIAGNOSIS — I509 Heart failure, unspecified: Secondary | ICD-10-CM | POA: Diagnosis not present

## 2019-04-02 ENCOUNTER — Ambulatory Visit: Payer: Medicare HMO

## 2019-04-12 ENCOUNTER — Encounter: Payer: Self-pay | Admitting: *Deleted

## 2019-04-12 ENCOUNTER — Other Ambulatory Visit: Payer: Self-pay

## 2019-04-12 ENCOUNTER — Ambulatory Visit (INDEPENDENT_AMBULATORY_CARE_PROVIDER_SITE_OTHER): Payer: Medicare HMO | Admitting: Cardiology

## 2019-04-12 VITALS — BP 100/68 | HR 77 | Ht 70.0 in | Wt 183.0 lb

## 2019-04-12 DIAGNOSIS — I10 Essential (primary) hypertension: Secondary | ICD-10-CM

## 2019-04-12 DIAGNOSIS — I255 Ischemic cardiomyopathy: Secondary | ICD-10-CM | POA: Diagnosis not present

## 2019-04-12 DIAGNOSIS — R011 Cardiac murmur, unspecified: Secondary | ICD-10-CM | POA: Diagnosis not present

## 2019-04-12 DIAGNOSIS — I2510 Atherosclerotic heart disease of native coronary artery without angina pectoris: Secondary | ICD-10-CM

## 2019-04-12 DIAGNOSIS — E782 Mixed hyperlipidemia: Secondary | ICD-10-CM

## 2019-04-12 DIAGNOSIS — K922 Gastrointestinal hemorrhage, unspecified: Secondary | ICD-10-CM | POA: Diagnosis not present

## 2019-04-12 MED ORDER — POTASSIUM CHLORIDE ER 20 MEQ PO TBCR: 20.0000 meq | EXTENDED_RELEASE_TABLET | Freq: Every day | ORAL | 1 refills | Status: DC

## 2019-04-12 MED ORDER — METOPROLOL SUCCINATE ER 25 MG PO TB24: 12.5000 mg | ORAL_TABLET | Freq: Every day | ORAL | 1 refills | Status: DC

## 2019-04-12 MED ORDER — FUROSEMIDE 40 MG PO TABS: 40.0000 mg | ORAL_TABLET | Freq: Every day | ORAL | 1 refills | Status: DC

## 2019-04-12 MED ORDER — ISOSORBIDE MONONITRATE ER 30 MG PO TB24: 30.0000 mg | ORAL_TABLET | Freq: Every day | ORAL | 1 refills | Status: DC

## 2019-04-12 NOTE — Progress Notes (Addendum)
04/12/2019 RAHMEL HASBROUCK WH:[id number] [birth year]   HISTORY OF PRESENT ILLNESS: Mr. Richard Townsend. Townsend is a 78 year old male with a past medical history significant for hypertension, CAD, ischemic cardiomyopathy with EF 30%, CAD, carotid stenosis,  AAA repair 2013, osteoarthritis, macular degeneration with visual impairment. He developed black tarry stools for one week mid September 2020. He takes 1 to 5 packets of Goody Powder daily for headaches since he was a teenager. He felt near faint and fell while in the bathroom on 02/28/2019. He got up off the bathroom floor and he walked into his bedroom and had a syncopal episode. His family found him on the floor without a pulse and they started CPR. 911 was called. The EMS team continued CPR and a pulse was found. He was in route to [facility] and he cardiac arrested, he was shocked x 1 as reported by his family. He was intubated on the vent. He was found to have profound anemia and he was transfused with 4 units of PRBCs. After he was stabilized, he was transferred by helicopter to [facility] 03/01/2019. His family stated he possibly had an EGD and colonoscopy done but this is unclear.  Please note, the patient presents without any of recent his hospital records and his hospital records are not found in care everywhere at this time. During his hospitalization, he developed pneumonia and a LLE DVT. He received Rocephin IV. He was started on Eliquis. He was home on 03/11/2019. He was seen by his PCP, Dr. Truman Hayward, in [place] on 03/19/2019. At that time, he had SOB and generalized anasarca to his arms, abdomen, legs and feet. He was sent directly to [facility]. A chest CTA was reported as negative for PE. He was again transferred to [facility] in [place] on 03/21/2019. A cardiac catheterization was done which showed a minor blockage to one coronary artery with evidence of collateral vessels. A stent was not required. He was discharged  home on Eliquis 5mg  bid, Pantoprazole 40mg  bid, Furosemide 40mg  QD, Isosorbide 30mg  QD and Metoprolol 12.5mg  QD. An out patient ECHO was ordered by his PCP.  Today, he presents with his son, Richard Townsend, who is assisting with the above history. The patient reports having normal brown stools as reported by other family members once he was discharged home from the hospital on 03/21/2019. However, the patient often goes to the bathroom and flushes the commode without family assessing his stool (patient is legally blind and cannot assess color of his stool).  He had some nausea on Saturday or Sunday and took Zofran which caused constipation.  Yesterday evening, Richard Townsend, witnessed a moderate amount of tarry black stool in the commode. No BM yet today.  He last took Eliquis at 7 AM this morning.  He denies having any dysphagia, heartburn or stomach pain.  No lower abdominal pain.  No family history of upper GI or colorectal cancer.  He complains of fatigue, SOB, feeling light headed and weak after walking 20 steps. He has chest pressure when he lays flat.  He had laboratory studies 04/12/2019 which showed a hemoglobin down to 7.1.  Hematocrit 24.5.  Platelet 449.  He stated his hemoglobin was 8 point something at the time of his hospital discharge.  I discussed hospital admission for repeat laboratory studies, blood transfusion and endoscopic evaluation, however, the patient refused.  He is willing to present to the emergency room at this time for repeat H&H and for blood transfusion.  He will consider hospital admission once he has been evaluated in the ED.  I consulted with out office supervising physician, Dr. Havery Moros. I contacted Dr. Silverio Lay ED physician and Dr. Tarri Glenn from our hospital GI service.   ADDENDUM: RECORDS FROM [facility] RECEIVED  EGD 02/28/2019: -Multiple gastric erosions, nonbleeding. Hematin was found in the gastric fundus. -Red blood was found in the entire duodenum. Three  nonbleeding cratered duodenal ulcers with the distal most (at the sweep) ulcer containing a nonbleeding visible vessel (Forrest class IIa) ulcer in the duodenum at transition between D1 and D2 treated with epinephrine 2 cc and clips x 3 with clot and red blood surrounding, likely culprit lesion. Two clean-based ulcers in the proximal duodenal bulb.  9/20/220 Labs from [facility]: Admission  Hg 11.7 >> 9.8>>7.66. Hg 7.6 on 03/11/2019  CBC    Component Value Date/Time   WBC 11.1 (H) 04/12/2019 1153   WBC 13.0 (H) 11/29/2011 0430   RBC 2.90 (L) 04/12/2019 1153   RBC 4.34 11/29/2011 0430   HGB 7.1 (L) 04/12/2019 1153   HCT 24.5 (L) 04/12/2019 1153   PLT 449 04/12/2019 1153   MCV 85 04/12/2019 1153   MCH 24.5 (L) 04/12/2019 1153   MCH 27.6 11/29/2011 0430   MCHC 29.0 (L) 04/12/2019 1153   MCHC 32.5 11/29/2011 0430   RDW 16.8 (H) 04/12/2019 1153   Past Medical History:  Diagnosis Date   AAA (abdominal aortic aneurysm) ([place]) 11/2011   Abdominal aneurysm without mention of rupture 01/06/2012   Abdominal tightness 01/11/2013   Allergic rhinitis    Arthritis    DVT (deep venous thrombosis) ([facility])    Dysmetabolic syndrome    Generalized osteoarthritis    GERD (gastroesophageal reflux disease)    Gout    Headache(784.0)    Hypercholesterolemia    Hypertension    Lower extremity edema    Nausea alone 07/13/2013   Occlusion and stenosis of carotid artery without mention of cerebral infarction 01/11/2013   Peptic ulcer    Shortness of breath    UGI bleed    Past Surgical History:  Procedure Laterality Date   ABDOMINAL AORTIC ANEURYSM REPAIR  11/28/11   stent    ABDOMINAL AORTIC ANEURYSM REPAIR     APPENDECTOMY     cataracts     EYE SURGERY  2009   Left cataract   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 He has a 76.50 pack-year smoking history. He  has never used smokeless tobacco. He reports that he does not drink alcohol or use drugs. family history includes Cancer in his mother and sister; Heart attack in his maternal grandfather; Hypertension in his brother, daughter, mother, and sister; Leukemia in his mother; Prostate cancer in his father. No Known Allergies    Outpatient Encounter Medications as of 04/13/2019  Medication Sig   acetaminophen (TYLENOL) 325 MG tablet Take 650 mg by mouth every 6 (six) hours as needed.   allopurinol (ZYLOPRIM) 300 MG tablet Take 1 tablet by mouth daily.   ALPRAZolam (XANAX) 0.25 MG tablet Take 0.25 mg by mouth 2 (two) times daily as needed for anxiety.   apixaban (ELIQUIS) 5 MG TABS tablet Take 5 mg by mouth 2 (two) times daily.   atorvastatin (LIPITOR) 40 MG tablet Take 40 mg by mouth daily. Taking  1/2 tab   beta carotene w/minerals (OCUVITE) tablet Take 1 tablet by mouth daily.   furosemide (LASIX) 40 MG tablet Take 1 tablet (40 mg total) by mouth daily.   isosorbide mononitrate (IMDUR) 30 MG 24 hr tablet Take 1 tablet (30 mg total) by mouth daily.   metoprolol succinate (TOPROL XL) 25 MG 24 hr tablet Take 0.5 tablets (12.5 mg total) by mouth daily.   ondansetron (ZOFRAN) 24 MG tablet Take 24 mg by mouth once.   pantoprazole (PROTONIX) 40 MG tablet Take 40 mg by mouth 2 (two) times daily.   Potassium Chloride ER 20 MEQ TBCR Take 20 mEq by mouth daily.   sacubitril-valsartan (ENTRESTO) 24-26 MG Take 1 tablet by mouth 2 (two) times daily.   sucralfate (CARAFATE) 1 g tablet Take 1 g by mouth 4 (four) times daily -  with meals and at bedtime.   SYMBICORT 160-4.5 MCG/ACT inhaler    No facility-administered encounter medications on file as of 04/13/2019.      REVIEW OF SYSTEMS  : All other systems reviewed and negative except where noted in the History of Present Illness.   PHYSICAL EXAM: There were no vitals taken for this visit. General: 78 year old male visually impaired sitting  in chair in NAD. Head: Normocephalic and atraumatic Eyes: Visual impairment, sclera nonicteric. Ears: Normal auditory acuity Neck: Supple, no masses.  Lungs: Clear throughout to auscultation Heart: 3/6 pansystolic murmur. Abdomen: Soft, nontender, non distended. No masses or hepatomegaly noted. Normal bowel sounds Rectal: Solid brown formed stool in the rectum, guaiac negative.  Musculoskeletal: Symmetrical with no gross deformities  Skin: No lesions on visible extremities Extremities: 1+ LE edema. Neurological: Alert oriented x 4, grossly nonfocal Cervical Nodes:  No significant cervical adenopathy Psychological:  Alert and cooperative. Normal mood and affect  ASSESSMENT AND PLAN:  54. 78 year old male with chronic BC powder use diagnosed with a GIB/melena 02/28/2019, required 4 units of PRBC infusion, possibly had EGD/colonocopy. His hospital course was complicated by pneumonia, CHF and LLE  DVT on Eliquis. See HPI. He presents today with with acute anemia secondary to GI bleed/melena.  Hg 7.1 on 11/3. -hospital admission recommended, patient declined -patient is willing to go to [facility] ER for repeat labs, PRBC transfusion -he will most likely need a repeat EGD in the hospital setting -Records from [facility] requested, will need to establish if patient had EGD and colonoscopy during his recent admission as pt/family thinks these procedures were done but results not known.  2. Coronary artery disease, cardiac arrest 9/202/20, cardiac catheterization 03/2019 reportedly showed single vessel disease with collaterals. -transfuse to keep Hg > 8  3. LLE DVT on Eliquis. Last dose of Eliquis was at 7am today.   3. Cardiomyopathy. Last ECHO EV 30%. PCP ordered ECHO 04/12/2019.   Further follow and endoscopic evaluation to be determined after ED evaluation completed. Await requested hospital records from [facility] and [facility] EGD  02/28/2019  CC:  [name], DO

## 2019-04-12 NOTE — Progress Notes (Addendum)
Cardiology Office Note:    Date:  04/12/2019   ID:  Richard Townsend, DOB 02/19/1941, MRN [id number]  PCP:  [name], DO  Cardiologist:  [name], DO  Electrophysiologist:  None   Referring MD: [name], MD   Chief Complaint  Patient presents with   Hospitalization Follow-up    [place]    History of Present Illness:    Richard Townsend is a 78 y.o. male with a hx of mild coronary artery disease, recent cardiac [facility] [place] where he was told that he had no significant blockage, DVT he has recently been placed on Eliquis 5 mg twice daily, hypertension, ischemic cardiomyopathy per documentation his most recent EF was 30%, hyperlipidemia and recent GI bleeding.  Patient is also legally blind  The patient was hospitalized most recently transferred Dr. Overnight from the [facility] after he presented with cold GI bleeding requiring [facility] to [facility] [place].  During hospitalization he was treated medically does not appear that he had endoscopy.  Once this was resolved there was concern for acute coronary syndrome he did undergo left heart cath clear during his stay also at the hospital he was noted to have a DVT and he was placed back on Eliquis with omeprazole.  The patient is here for follow-up visit.  He denies any chest pain, shortness of breath, nausea, vomiting.  He was accompanied by his son-in-law Past Medical History:  Diagnosis Date   AAA (abdominal aortic aneurysm) ([facility]) 11/2011   Abdominal aneurysm without mention of rupture 01/06/2012   Abdominal tightness 01/11/2013   Allergic rhinitis    Arthritis    DVT (deep venous thrombosis) ([facility])    Dysmetabolic syndrome    Generalized osteoarthritis    GERD (gastroesophageal reflux disease)    Gout    Headache(784.0)    Hypercholesterolemia    Hypertension    Lower extremity edema    Nausea alone 07/13/2013   Occlusion and stenosis of carotid artery  without mention of cerebral infarction 01/11/2013   Peptic ulcer    Shortness of breath    UGI bleed     Past Surgical History:  Procedure Laterality Date   ABDOMINAL AORTIC ANEURYSM REPAIR  11/28/11   stent    ABDOMINAL AORTIC ANEURYSM REPAIR     APPENDECTOMY     cataracts     EYE SURGERY  2009   Left cataract   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   allopurinol (ZYLOPRIM) 300 MG tablet Take 1 tablet by mouth daily.   ALPRAZolam (XANAX) 0.25 MG tablet Take 0.25 mg by mouth 2 (two) times daily as needed for anxiety.   apixaban (ELIQUIS) 5 MG TABS tablet Take 5 mg by mouth 2 (two) times daily.   atorvastatin (LIPITOR) 40 MG tablet Take 40 mg by mouth daily. Taking 1/2 tab   beta carotene w/minerals (OCUVITE) tablet Take 1 tablet by mouth daily.   isosorbide mononitrate (IMDUR) 30 MG 24 hr tablet Take 1 tablet (30 mg total) by mouth daily.   ondansetron (ZOFRAN) 24 MG tablet Take 24 mg by mouth once.   pantoprazole (PROTONIX) 40 MG tablet Take 40 mg by mouth 2 (two) times daily.  Potassium Chloride ER 20 MEQ TBCR Take 20 mEq by mouth daily.   sacubitril-valsartan (ENTRESTO) 24-26 MG Take 1 tablet by mouth 2 (two) times daily.   sucralfate (CARAFATE) 1 g tablet Take 1 g by mouth 4 (four) times daily -  with meals and at bedtime.   SYMBICORT 160-4.5 MCG/ACT inhaler    [DISCONTINUED] furosemide (LASIX) 80 MG tablet Take 80 mg by mouth.   [DISCONTINUED] isosorbide mononitrate (IMDUR) 30 MG 24 hr tablet Take 30 mg by mouth daily.   [DISCONTINUED] metoprolol succinate (TOPROL-XL) 25 MG 24 hr tablet Take 25 mg by mouth daily.   [DISCONTINUED] Potassium Chloride ER 20 MEQ TBCR Take by mouth.     Allergies:   Patient has no known allergies.   Social  History   Socioeconomic History   Marital status: Married    Spouse name: Not on file   Number of children: Not on file   Years of education: Not on file   Highest education level: Not on file  Occupational History   Not on file  Social Needs   Financial resource strain: Not on file   Food insecurity    Worry: Not on file    Inability: Not on file   Transportation needs    Medical: No    Non-medical: No  Tobacco Use   Smoking status: Current Every Day Smoker    Packs/day: 1.50    Years: 51.00    Pack years: 76.50    Types: Cigarettes   Smokeless tobacco: Never Used  Substance and Sexual Activity   Alcohol use: No   Drug use: No   Sexual activity: Not on file  Lifestyle   Physical activity    Days per week: Not on file    Minutes per session: Not on file   Stress: Not on file  Relationships   Social connections    Talks on phone: Not on file    Gets together: Not on file    Attends religious service: Not on file    Active member of club or organization: Not on file    Attends meetings of clubs or organizations: Not on file    Relationship status: Not on file  Other Topics Concern   Not on file  Social History Narrative   Not on file     Family History: The patient's family history includes Cancer in his mother and sister; Heart attack in his maternal grandfather; Hypertension in his brother, daughter, mother, and sister; Leukemia in his mother; Prostate cancer in his father.  ROS:   Review of Systems  Constitution: Negative for decreased appetite, fever and weight gain.  HENT: Negative for congestion, ear discharge, hoarse voice and sore throat.   Eyes: Negative for discharge, redness, vision loss in right eye and visual halos.  Cardiovascular: Negative for chest pain, dyspnea on exertion, leg swelling, orthopnea and palpitations.  Respiratory: Negative for cough, hemoptysis, shortness of breath and snoring.   Endocrine: Negative for heat  intolerance and polyphagia.  Hematologic/Lymphatic: Negative for bleeding problem. Does not bruise/bleed easily.  Skin: Negative for flushing, nail changes, rash and suspicious lesions.  Musculoskeletal: Negative for arthritis, joint pain, muscle cramps, myalgias, neck pain and stiffness.  Gastrointestinal: Negative for abdominal pain, bowel incontinence, diarrhea and excessive appetite.  Genitourinary: Negative for decreased libido, genital sores and incomplete emptying.  Neurological: Negative for brief paralysis, focal weakness, headaches and loss of balance.  Psychiatric/Behavioral: Negative for altered mental status, depression and suicidal ideas.  Allergic/Immunologic:  Negative for HIV exposure and persistent infections.    EKGs/Labs/Other Studies Reviewed:    The following studies were reviewed today:   EKG:  The ekg ordered today demonstrates   CT angiogram of the chest with contrast March 19, 2019 impression negative for pulmonary embolism.  Mild right and very small left pleural effusion with compressive atelectasis.  Multiple small mediastinal lymph nodes which are new get the prior study.  For coronary artery disease.  Recent Labs: No results found for requested labs within last 8760 hours.  Recent Lipid Panel No results found for: CHOL, TRIG, HDL, CHOLHDL, VLDL, LDLCALC, LDLDIRECT  Physical Exam:    VS:  BP 100/68 (BP Location: Right Arm, Patient Position: Sitting, Cuff Size: Normal)    Pulse 77    Ht 5\' 10"  (1.778 m)    Wt 183 lb (83 kg)    SpO2 96%    BMI 26.26 kg/m     Wt Readings from Last 3 Encounters:  04/12/19 183 lb (83 kg)  02/17/17 190 lb (86.2 kg)  02/05/16 188 lb (85.3 kg)     GEN: Well nourished, well developed in no acute distress HEENT: Normal NECK: No JVD; No carotid bruits LYMPHATICS: No lymphadenopathy CARDIAC: S1S2 noted,RRR, 2/6 ejection systolic murmurs, rubs, gallops RESPIRATORY:  Clear to auscultation without rales, wheezing or rhonchi    ABDOMEN: Soft, non-tender, non-distended, +bowel sounds, no guarding. EXTREMITIES: No edema, No cyanosis, no clubbing MUSCULOSKELETAL:  No edema; No deformity  SKIN: Warm and dry NEUROLOGIC:  Alert and oriented x 3, non-focal PSYCHIATRIC:  Normal affect, good insight  ASSESSMENT:    1. Ischemic cardiomyopathy   2. Gastrointestinal hemorrhage, unspecified gastrointestinal hemorrhage type   3. Mild CAD   4. Essential hypertension   5. Systolic murmur   6. Mixed hyperlipidemia    PLAN:    1.  His recent Show evidence of coronary artery disease.  Therefore at this time we will continue the patient on his current medication regimen which includes Imdur 30 mg daily and atorvastatin 40 mg daily.  I am decreasing his metoprolol succinate from 20 mg daily to 12.5 mg daily, as well as cutting back on his Lasix 80 mg daily to 40 mg daily.  He reports lightheadedness at times as well as he does have low blood pressure in the office today.  2.  He also does have ischemic cardiomyopathy reported LVEF per records is 30% unclear when this cardiogram was performed.  However the patient is currently on metoprolol succinate, Entresto 24-26 mg twice daily.  At this time I like to repeat this testing to have a baseline echocardiogram on this patient.  As he establish care.  3.  There is evidence of systolic murmur on his physical exam.  Therefore a transthoracic echocardiogram will also help [place] understand the nature of his valvular disease.  4. He was diagnosed with a DVT and has been placed on Eliquis 5 mg twice daily.  He has not seen GI yet even after his recent GI bleeding.  Is unclear if he did have endoscopy.  Therefore at this time I am referring this patient to GI for assessment of due to concern for bleeding ulcer.  For now he is on pantoprazole 40 mg twice daily.  5.  DVT-he plans to see hematology oncology to discuss duration of his anticoagulation at that time.  6.  Hyperlipidemia -he will  remain on atorvastatin 40 mg daily  Blood work will be performed today.  Includes CBC,  BMP, mag.  The patient is in agreement with the above plan. The patient left the office in stable condition.  The patient will follow up in 3 months.   Medication Adjustments/Labs and Tests Ordered: Current medicines are reviewed at length with the patient today.  Concerns regarding medicines are outlined above.  Orders Placed This Encounter  Procedures   Basic Metabolic Panel (BMET)   Magnesium   CBC   Ambulatory referral to Gastroenterology   EKG 12-Lead   ECHOCARDIOGRAM COMPLETE   Meds ordered this encounter  Medications   furosemide (LASIX) 40 MG tablet    Sig: Take 1 tablet (40 mg total) by mouth daily.    Dispense:  90 tablet    Refill:  1   metoprolol succinate (TOPROL XL) 25 MG 24 hr tablet    Sig: Take 0.5 tablets (12.5 mg total) by mouth daily.    Dispense:  45 tablet    Refill:  1   isosorbide mononitrate (IMDUR) 30 MG 24 hr tablet    Sig: Take 1 tablet (30 mg total) by mouth daily.    Dispense:  90 tablet    Refill:  1   Potassium Chloride ER 20 MEQ TBCR    Sig: Take 20 mEq by mouth daily.    Dispense:  90 tablet    Refill:  1    Patient Instructions  Medication Instructions:  Your physician has recommended you make the following change in your medication:   DECREASE : Toprol XL 25 mg Take 1/2 tab daily DECREASE : Lasix(furosemide) to 40 mg Take 1 tab daily ( may take 1/2 a tb of 80 mg until you run out)  *If you need a refill on your cardiac medications before your next appointment, please call your pharmacy*  Lab Work: Your physician recommends that you return for lab work in: TODAY BMP,CBC,Magnesium  If you have labs (blood work) drawn today and your tests are completely normal, you will receive your results only by:  [place] (if you have MyChart) OR  A paper copy in the mail If you have any lab test that is abnormal or we need to change your  treatment, we will call you to review the results.  Testing/Procedures: Your physician has requested that you have an echocardiogram. Echocardiography is a painless test that uses sound waves to create images of your heart. It provides your doctor with information about the size and shape of your heart and how well your hearts chambers and valves are working. This procedure takes approximately one hour. There are no restrictions for this procedure.    Follow-Up: At [facility], you and your health needs are our priority.  As part of our continuing mission to provide you with exceptional heart care, we have created designated Provider Care Teams.  These Care Teams include your primary Cardiologist (physician) and Advanced Practice Providers (APPs -  Physician Assistants and Nurse Practitioners) who all work together to provide you with the care you need, when you need it.  Your next appointment:   3 months  The format for your next appointment:   In Person  Provider:   [name], DO  Other Instructions  You are being referredto Gastroenterology for you bleeding. They will contact you for an appointment date and time.     Adopting a Healthy Lifestyle.  Know what a healthy weight is for you (roughly BMI <25) and aim to maintain this   Aim for 7+ servings of fruits and  vegetables daily   65-80+ fluid ounces of water or unsweet tea for healthy kidneys   Limit to max 1 drink of alcohol per day; avoid smoking/tobacco   Limit animal fats in diet for cholesterol and heart health - choose grass fed whenever available   Avoid highly processed foods, and foods high in saturated/trans fats   Aim for low stress - take time to unwind and care for your mental health   Aim for 150 min of moderate intensity exercise weekly for heart health, and weights twice weekly for bone health   Aim for 7-9 hours of sleep daily   When it comes to diets, agreement about the perfect plan isnt easy  to find, even among the experts. Experts at the [employer] developed an idea known as the Healthy Eating Plate. Just imagine a plate divided into logical, healthy portions.   The emphasis is on diet quality:   Load up on vegetables and fruits - one-half of your plate: Aim for color and variety, and remember that potatoes dont count.   Go for whole grains - one-quarter of your plate: Whole wheat, barley, wheat berries, quinoa, oats, brown rice, and foods made with them. If you want pasta, go with whole wheat pasta.   Protein power - one-quarter of your plate: Fish, chicken, beans, and nuts are all healthy, versatile protein sources. Limit red meat.   The diet, however, does go beyond the plate, offering a few other suggestions.   Use healthy plant oils, such as olive, canola, soy, corn, sunflower and peanut. Check the labels, and avoid partially hydrogenated oil, which have unhealthy trans fats.   If youre thirsty, drink water. Coffee and tea are good in moderation, but skip sugary drinks and limit milk and dairy products to one or two daily servings.   The type of carbohydrate in the diet is more important than the amount. Some sources of carbohydrates, such as vegetables, fruits, whole grains, and beans-are healthier than others.   Finally, stay active  Signed, [name], DO  04/12/2019 5:36 PM    [facility]

## 2019-04-12 NOTE — Patient Instructions (Signed)
Medication Instructions:  Your physician has recommended you make the following change in your medication:   DECREASE : Toprol XL 25 mg Take 1/2 tab daily DECREASE : Lasix(furosemide) to 40 mg Take 1 tab daily ( may take 1/2 a tb of 80 mg until you run out)  *If you need a refill on your cardiac medications before your next appointment, please call your pharmacy*  Lab Work: Your physician recommends that you return for lab work in: TODAY BMP,CBC,Magnesium  If you have labs (blood work) drawn today and your tests are completely normal, you will receive your results only by: [place] [facility] MyChart Message (if you have MyChart) OR . A paper copy in the mail If you have any lab test that is abnormal or we need to change your treatment, we will call you to review the results.  Testing/Procedures: Your physician has requested that you have an echocardiogram. Echocardiography is a painless test that uses sound waves to create images of your heart. It provides your doctor with information about the size and shape of your heart and how well your heart's chambers and valves are working. This procedure takes approximately one hour. There are no restrictions for this procedure.    Follow-Up: At [facility], you and your health needs are our priority.  As part of our continuing mission to provide you with exceptional heart care, we have created designated Provider Care Teams.  These Care Teams include your primary Cardiologist (physician) and Advanced Practice Providers (APPs -  Physician Assistants and Nurse Practitioners) who all work together to provide you with the care you need, when you need it.  Your next appointment:   3 months  The format for your next appointment:   In Person  Provider:   [name], DO  Other Instructions  You are being referredto Gastroenterology for you bleeding. They will contact you for an appointment date and time.

## 2019-04-13 ENCOUNTER — Inpatient Hospital Stay (HOSPITAL_COMMUNITY)
Admission: EM | Admit: 2019-04-13 | Discharge: 2019-04-15 | DRG: 378 | Disposition: A | Discharge status: Home Health | Payer: Medicare HMO | Attending: Internal Medicine | Admitting: Internal Medicine

## 2019-04-13 ENCOUNTER — Other Ambulatory Visit: Payer: Self-pay

## 2019-04-13 ENCOUNTER — Encounter (HOSPITAL_COMMUNITY): Payer: Self-pay | Admitting: Emergency Medicine

## 2019-04-13 ENCOUNTER — Encounter: Payer: Self-pay | Admitting: Nurse Practitioner

## 2019-04-13 ENCOUNTER — Emergency Department (HOSPITAL_COMMUNITY): Payer: Medicare HMO

## 2019-04-13 ENCOUNTER — Ambulatory Visit: Payer: Medicare HMO | Admitting: Nurse Practitioner

## 2019-04-13 DIAGNOSIS — K219 Gastro-esophageal reflux disease without esophagitis: Secondary | ICD-10-CM | POA: Diagnosis present

## 2019-04-13 DIAGNOSIS — I82452 Acute embolism and thrombosis of left peroneal vein: Secondary | ICD-10-CM | POA: Diagnosis not present

## 2019-04-13 DIAGNOSIS — S43001A Unspecified subluxation of right shoulder joint, initial encounter: Secondary | ICD-10-CM | POA: Diagnosis present

## 2019-04-13 DIAGNOSIS — I82409 Acute embolism and thrombosis of unspecified deep veins of unspecified lower extremity: Secondary | ICD-10-CM | POA: Diagnosis not present

## 2019-04-13 DIAGNOSIS — M109 Gout, unspecified: Secondary | ICD-10-CM | POA: Diagnosis present

## 2019-04-13 DIAGNOSIS — D649 Anemia, unspecified: Secondary | ICD-10-CM | POA: Insufficient documentation

## 2019-04-13 DIAGNOSIS — I447 Left bundle-branch block, unspecified: Secondary | ICD-10-CM | POA: Diagnosis not present

## 2019-04-13 DIAGNOSIS — R9431 Abnormal electrocardiogram [ECG] [EKG]: Secondary | ICD-10-CM | POA: Diagnosis not present

## 2019-04-13 DIAGNOSIS — H548 Legal blindness, as defined in USA: Secondary | ICD-10-CM | POA: Diagnosis present

## 2019-04-13 DIAGNOSIS — Z8674 Personal history of sudden cardiac arrest: Secondary | ICD-10-CM

## 2019-04-13 DIAGNOSIS — K2970 Gastritis, unspecified, without bleeding: Secondary | ICD-10-CM

## 2019-04-13 DIAGNOSIS — N179 Acute kidney failure, unspecified: Secondary | ICD-10-CM | POA: Diagnosis not present

## 2019-04-13 DIAGNOSIS — K2991 Gastroduodenitis, unspecified, with bleeding: Secondary | ICD-10-CM | POA: Diagnosis present

## 2019-04-13 DIAGNOSIS — I5022 Chronic systolic (congestive) heart failure: Secondary | ICD-10-CM | POA: Diagnosis present

## 2019-04-13 DIAGNOSIS — H3530 Unspecified macular degeneration: Secondary | ICD-10-CM | POA: Diagnosis present

## 2019-04-13 DIAGNOSIS — I248 Other forms of acute ischemic heart disease: Secondary | ICD-10-CM | POA: Diagnosis present

## 2019-04-13 DIAGNOSIS — K922 Gastrointestinal hemorrhage, unspecified: Secondary | ICD-10-CM

## 2019-04-13 DIAGNOSIS — F17210 Nicotine dependence, cigarettes, uncomplicated: Secondary | ICD-10-CM | POA: Diagnosis present

## 2019-04-13 DIAGNOSIS — Z79899 Other long term (current) drug therapy: Secondary | ICD-10-CM

## 2019-04-13 DIAGNOSIS — X500XXA Overexertion from strenuous movement or load, initial encounter: Secondary | ICD-10-CM

## 2019-04-13 DIAGNOSIS — I10 Essential (primary) hypertension: Secondary | ICD-10-CM | POA: Diagnosis not present

## 2019-04-13 DIAGNOSIS — Z8249 Family history of ischemic heart disease and other diseases of the circulatory system: Secondary | ICD-10-CM | POA: Diagnosis not present

## 2019-04-13 DIAGNOSIS — I110 Hypertensive heart disease with heart failure: Secondary | ICD-10-CM | POA: Diagnosis present

## 2019-04-13 DIAGNOSIS — Z8679 Personal history of other diseases of the circulatory system: Secondary | ICD-10-CM

## 2019-04-13 DIAGNOSIS — E785 Hyperlipidemia, unspecified: Secondary | ICD-10-CM | POA: Diagnosis present

## 2019-04-13 DIAGNOSIS — K921 Melena: Secondary | ICD-10-CM

## 2019-04-13 DIAGNOSIS — I82402 Acute embolism and thrombosis of unspecified deep veins of left lower extremity: Secondary | ICD-10-CM | POA: Diagnosis not present

## 2019-04-13 DIAGNOSIS — D62 Acute posthemorrhagic anemia: Secondary | ICD-10-CM | POA: Diagnosis not present

## 2019-04-13 DIAGNOSIS — I739 Peripheral vascular disease, unspecified: Secondary | ICD-10-CM | POA: Diagnosis present

## 2019-04-13 DIAGNOSIS — I2510 Atherosclerotic heart disease of native coronary artery without angina pectoris: Secondary | ICD-10-CM | POA: Diagnosis present

## 2019-04-13 DIAGNOSIS — M25511 Pain in right shoulder: Secondary | ICD-10-CM | POA: Diagnosis not present

## 2019-04-13 DIAGNOSIS — K259 Gastric ulcer, unspecified as acute or chronic, without hemorrhage or perforation: Secondary | ICD-10-CM | POA: Diagnosis not present

## 2019-04-13 DIAGNOSIS — M1990 Unspecified osteoarthritis, unspecified site: Secondary | ICD-10-CM | POA: Diagnosis present

## 2019-04-13 DIAGNOSIS — Z86718 Personal history of other venous thrombosis and embolism: Secondary | ICD-10-CM | POA: Diagnosis not present

## 2019-04-13 DIAGNOSIS — Z20828 Contact with and (suspected) exposure to other viral communicable diseases: Secondary | ICD-10-CM | POA: Diagnosis present

## 2019-04-13 DIAGNOSIS — K2901 Acute gastritis with bleeding: Principal | ICD-10-CM | POA: Diagnosis present

## 2019-04-13 DIAGNOSIS — Z7901 Long term (current) use of anticoagulants: Secondary | ICD-10-CM | POA: Diagnosis not present

## 2019-04-13 DIAGNOSIS — E7800 Pure hypercholesterolemia, unspecified: Secondary | ICD-10-CM | POA: Diagnosis present

## 2019-04-13 DIAGNOSIS — I82412 Acute embolism and thrombosis of left femoral vein: Secondary | ICD-10-CM | POA: Diagnosis not present

## 2019-04-13 DIAGNOSIS — R079 Chest pain, unspecified: Secondary | ICD-10-CM | POA: Diagnosis not present

## 2019-04-13 DIAGNOSIS — I959 Hypotension, unspecified: Secondary | ICD-10-CM | POA: Diagnosis not present

## 2019-04-13 DIAGNOSIS — K3189 Other diseases of stomach and duodenum: Secondary | ICD-10-CM | POA: Diagnosis not present

## 2019-04-13 DIAGNOSIS — I255 Ischemic cardiomyopathy: Secondary | ICD-10-CM | POA: Diagnosis present

## 2019-04-13 DIAGNOSIS — R0600 Dyspnea, unspecified: Secondary | ICD-10-CM | POA: Diagnosis not present

## 2019-04-13 DIAGNOSIS — K260 Acute duodenal ulcer with hemorrhage: Secondary | ICD-10-CM | POA: Diagnosis not present

## 2019-04-13 DIAGNOSIS — R52 Pain, unspecified: Secondary | ICD-10-CM

## 2019-04-13 DIAGNOSIS — T39015A Adverse effect of aspirin, initial encounter: Secondary | ICD-10-CM | POA: Diagnosis present

## 2019-04-13 HISTORY — DX: Anemia, unspecified: D64.9

## 2019-04-13 HISTORY — DX: Melena: K92.1

## 2019-04-13 HISTORY — DX: Gastrointestinal hemorrhage, unspecified: K92.2

## 2019-04-13 LAB — CBC
HCT: 23.6 % — ABNORMAL LOW (ref 39.0–52.0)
Hematocrit: 24.5 % — ABNORMAL LOW (ref 37.5–51.0)
Hemoglobin: 7.1 g/dL — ABNORMAL LOW (ref 13.0–17.7)
Hemoglobin: 7 g/dL — ABNORMAL LOW (ref 13.0–17.0)
MCH: 24.5 pg — ABNORMAL LOW (ref 26.6–33.0)
MCH: 25 pg — ABNORMAL LOW (ref 26.0–34.0)
MCHC: 29 g/dL — ABNORMAL LOW (ref 31.5–35.7)
MCHC: 29.7 g/dL — ABNORMAL LOW (ref 30.0–36.0)
MCV: 85 fL (ref 79–97)
MCV: 84.3 fL (ref 80.0–100.0)
Platelets: 449 10*3/uL (ref 150–450)
Platelets: 379 10*3/uL (ref 150–400)
RBC: 2.9 x10E6/uL — ABNORMAL LOW (ref 4.14–5.80)
RBC: 2.8 MIL/uL — ABNORMAL LOW (ref 4.22–5.81)
RDW: 16.8 % — ABNORMAL HIGH (ref 11.6–15.4)
RDW: 17.3 % — ABNORMAL HIGH (ref 11.5–15.5)
WBC: 11.1 10*3/uL — ABNORMAL HIGH (ref 3.4–10.8)
WBC: 9.5 10*3/uL (ref 4.0–10.5)
nRBC: 0 % (ref 0.0–0.2)

## 2019-04-13 LAB — PROTIME-INR
INR: 1.3 — ABNORMAL HIGH (ref 0.8–1.2)
Prothrombin Time: 16.3 seconds — ABNORMAL HIGH (ref 11.4–15.2)

## 2019-04-13 LAB — COMPREHENSIVE METABOLIC PANEL
ALT: 11 U/L (ref 0–44)
AST: 8 U/L — ABNORMAL LOW (ref 15–41)
Albumin: 3.5 g/dL (ref 3.5–5.0)
Alkaline Phosphatase: 78 U/L (ref 38–126)
Anion gap: 12 (ref 5–15)
BUN: 45 mg/dL — ABNORMAL HIGH (ref 8–23)
CO2: 20 mmol/L — ABNORMAL LOW (ref 22–32)
Calcium: 8.9 mg/dL (ref 8.9–10.3)
Chloride: 103 mmol/L (ref 98–111)
Creatinine, Ser: 2.21 mg/dL — ABNORMAL HIGH (ref 0.61–1.24)
GFR calc Af Amer: 32 mL/min — ABNORMAL LOW (ref >60)
GFR calc non Af Amer: 28 mL/min — ABNORMAL LOW (ref >60)
Glucose, Bld: 99 mg/dL (ref 70–99)
Potassium: 4.1 mmol/L (ref 3.5–5.1)
Sodium: 135 mmol/L (ref 135–145)
Total Bilirubin: 0.5 mg/dL (ref 0.3–1.2)
Total Protein: 7.4 g/dL (ref 6.5–8.1)

## 2019-04-13 LAB — BASIC METABOLIC PANEL
BUN/Creatinine Ratio: 18 (ref 10–24)
BUN: 34 mg/dL — ABNORMAL HIGH (ref 8–27)
CO2: 21 mmol/L (ref 20–29)
Calcium: 9.4 mg/dL (ref 8.6–10.2)
Chloride: 100 mmol/L (ref 96–106)
Creatinine, Ser: 1.86 mg/dL — ABNORMAL HIGH (ref 0.76–1.27)
GFR calc Af Amer: 40 mL/min/{1.73_m2} — ABNORMAL LOW (ref >59)
GFR calc non Af Amer: 34 mL/min/{1.73_m2} — ABNORMAL LOW (ref >59)
Glucose: 107 mg/dL — ABNORMAL HIGH (ref 65–99)
Potassium: 5.1 mmol/L (ref 3.5–5.2)
Sodium: 135 mmol/L (ref 134–144)

## 2019-04-13 LAB — SARS CORONAVIRUS 2 (TAT 6-24 HRS): SARS Coronavirus 2: NEGATIVE

## 2019-04-13 LAB — APTT: aPTT: 32 seconds (ref 24–36)

## 2019-04-13 LAB — PREPARE RBC (CROSSMATCH)

## 2019-04-13 LAB — HEMOGLOBIN AND HEMATOCRIT, BLOOD
HCT: 26.4 % — ABNORMAL LOW (ref 39.0–52.0)
Hemoglobin: 8 g/dL — ABNORMAL LOW (ref 13.0–17.0)

## 2019-04-13 LAB — ABO/RH: ABO/RH(D): A POS

## 2019-04-13 LAB — MAGNESIUM: Magnesium: 2.2 mg/dL (ref 1.6–2.3)

## 2019-04-13 MED ORDER — PANTOPRAZOLE SODIUM 40 MG IV SOLR: 40.0000 mg | Freq: Two times a day (BID) | INTRAVENOUS | Status: DC

## 2019-04-13 MED ORDER — METOPROLOL SUCCINATE ER 25 MG PO TB24
12.5000 mg | ORAL_TABLET | Freq: Every day | ORAL | Status: DC
  Administered 2019-04-14 – 2019-04-15 (×2): 12.5 mg via ORAL
  Filled 2019-04-13 (×2): qty 1

## 2019-04-13 MED ORDER — ACETAMINOPHEN 325 MG PO TABS
650.0000 mg | ORAL_TABLET | Freq: Four times a day (QID) | ORAL | Status: DC | PRN
  Administered 2019-04-13 – 2019-04-15 (×5): 650 mg via ORAL
  Filled 2019-04-13 (×5): qty 2

## 2019-04-13 MED ORDER — SODIUM CHLORIDE 0.9 % IV SOLN
8.0000 mg/h | INTRAVENOUS | Status: DC
  Administered 2019-04-13 – 2019-04-15 (×3): 8 mg/h via INTRAVENOUS
  Filled 2019-04-13 (×6): qty 80

## 2019-04-13 MED ORDER — FUROSEMIDE 40 MG PO TABS
40.0000 mg | ORAL_TABLET | Freq: Every day | ORAL | Status: DC
  Administered 2019-04-14 – 2019-04-15 (×2): 40 mg via ORAL
  Filled 2019-04-13 (×2): qty 1

## 2019-04-13 MED ORDER — SUCRALFATE 1 G PO TABS
1.0000 g | ORAL_TABLET | Freq: Three times a day (TID) | ORAL | Status: DC
  Administered 2019-04-13 – 2019-04-15 (×6): 1 g via ORAL
  Filled 2019-04-13 (×6): qty 1

## 2019-04-13 MED ORDER — PROSIGHT PO TABS
1.0000 | ORAL_TABLET | Freq: Every day | ORAL | Status: DC
  Administered 2019-04-14 – 2019-04-15 (×2): 1 via ORAL
  Filled 2019-04-13 (×2): qty 1

## 2019-04-13 MED ORDER — ATORVASTATIN CALCIUM 10 MG PO TABS
20.0000 mg | ORAL_TABLET | Freq: Every day | ORAL | Status: DC
  Administered 2019-04-14 – 2019-04-15 (×2): 20 mg via ORAL
  Filled 2019-04-13: qty 1
  Filled 2019-04-13: qty 2

## 2019-04-13 MED ORDER — ISOSORBIDE MONONITRATE ER 30 MG PO TB24: 30.0000 mg | ORAL_TABLET | Freq: Every day | ORAL | Status: DC

## 2019-04-13 MED ORDER — ALLOPURINOL 300 MG PO TABS: 300.0000 mg | ORAL_TABLET | Freq: Every day | ORAL | Status: DC

## 2019-04-13 MED ORDER — SODIUM CHLORIDE 0.9 % IV BOLUS
500.0000 mL | Freq: Once | INTRAVENOUS | Status: AC
  Administered 2019-04-13: 13:00:00 500 mL via INTRAVENOUS

## 2019-04-13 MED ORDER — ALLOPURINOL 300 MG PO TABS: 150.0000 mg | ORAL_TABLET | Freq: Every day | ORAL | Status: DC

## 2019-04-13 MED ORDER — ONDANSETRON HCL 4 MG PO TABS: 24.0000 mg | ORAL_TABLET | ORAL | Status: DC | PRN

## 2019-04-13 MED ORDER — FUROSEMIDE 40 MG PO TABS: 40.0000 mg | ORAL_TABLET | Freq: Every day | ORAL | Status: DC

## 2019-04-13 MED ORDER — ONDANSETRON HCL 4 MG PO TABS: 4.0000 mg | ORAL_TABLET | Freq: Three times a day (TID) | ORAL | Status: DC | PRN

## 2019-04-13 MED ORDER — SODIUM CHLORIDE 0.9% FLUSH
3.0000 mL | Freq: Two times a day (BID) | INTRAVENOUS | Status: DC
  Administered 2019-04-14 – 2019-04-15 (×2): 3 mL via INTRAVENOUS

## 2019-04-13 MED ORDER — SODIUM CHLORIDE 0.9% IV SOLUTION
Freq: Once | INTRAVENOUS | Status: AC
  Administered 2019-04-13: 22:00:00 via INTRAVENOUS

## 2019-04-13 MED ORDER — POTASSIUM CHLORIDE ER 20 MEQ PO TBCR: 20.0000 meq | EXTENDED_RELEASE_TABLET | Freq: Every day | ORAL | Status: DC

## 2019-04-13 MED ORDER — THERAWORX RELIEF EX FOAM: 1.0000 "application " | CUTANEOUS | Status: DC | PRN

## 2019-04-13 MED ORDER — SODIUM CHLORIDE 0.9 % IV SOLN
10.0000 mL/h | Freq: Once | INTRAVENOUS | Status: AC
  Administered 2019-04-13: 13:00:00 10 mL/h via INTRAVENOUS

## 2019-04-13 NOTE — Progress Notes (Signed)
Patient was admitted to [facility]. GI consult tomorrow. See addendum to office note today. Plan for repeat EGD.

## 2019-04-13 NOTE — ED Notes (Signed)
Pt c/o right shoulder pain- stated he reached to grab something and heard pop

## 2019-04-13 NOTE — ED Notes (Signed)
Pt provided sandwich and water. 

## 2019-04-13 NOTE — H&P (Signed)
History and Physical    Richard Townsend [id number] DOB: Jun 22, 1940 DOA: 04/13/2019  PCP: [name], DO  Patient coming from: Home  I have personally briefly reviewed patient's old medical records in [place]  Chief Complaint: Black stools  HPI: Richard Townsend is a 78 y.o. 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  Patient was sent to [facility] and subsequently [facility], where patient was noted to have melena in setting of BC Powder, Pneumonia, and hospital course c/b DVT w/out PE (all per report).  He was subsequently seen by PCP with ansarca and SOB and underwent angiography wit non-obstructive CAD, no PCI, thus was maintained on Apixaban for his DVT but not started on ant-platelet therapy for CAD due to prior bleeding risk.  Patient's family bringing in outside records, but patient believes he has had EGD and Colonoscopy already.  Please reference additional details by well-written Hx from NP Kennedy-Smith ([facility]).  Patient brought in by son-in-law, [name], who is not currently present.  Patient reports that he presents today after notable drop in Hgb after recent blood work and noted black tarry stools by family members last night.  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  He states he had been feeling more lightheaded and dizzy but this is only with walking quickly or longer distances.  He denies any syncope or near syncope.  He states he does have an acute issue of R. Shoulder pain, he feels as though he popped something while trying to grab at something and this occurred recently.  He is still able to move it around but is limited by  pain.  He denies any other symptoms at this time.  He has not been having any fevers, chills, nausea/vomiting, chest pain, sob or cough.  He states he had swelling in his legs but that has all improved.  He states the DVT was in his L leg but no residual pain or swelling.  He denies any constipation or diarrhea.  Patient reports he has been taking all of his medications as prescribed, and has avoided NSAIDs since admission in September.  He states he has not taken aspirin or other blood thinners except for his Apixaban, last dose was this morning.  Patient does state he occasionally smokes (previous 38 years has been everyday smoker), denies EtOH or other drugs.  He lives with his wife, reports after his recent hospitalization and cardiac arrest he would still want to undergo CPR/Intubation.  He feels he survived already so would want another chance were it to recur but is aware same outcomes are not always expected.  ED Course: Patient consented for blood by ER and will receive 1 unit.  He is anxious about being admitted but would like to have all of this evaluated.   Review of Systems: As per HPI otherwise 10 point review of systems negative.    Past Medical History:  Diagnosis Date   AAA (abdominal aortic aneurysm) ([place]) 11/2011   Abdominal aneurysm without mention of rupture 01/06/2012   Abdominal tightness 01/11/2013   Allergic rhinitis    Arthritis    DVT (deep venous thrombosis) (HCC)    Dysmetabolic syndrome  Generalized osteoarthritis    GERD (gastroesophageal reflux disease)    Gout    Headache(784.0)    Hypercholesterolemia    Hypertension    Lower extremity edema    Nausea alone 07/13/2013   Occlusion and stenosis of carotid artery without mention of cerebral infarction 01/11/2013   Peptic ulcer    Shortness of breath    UGI bleed     Past Surgical History:  Procedure Laterality Date   ABDOMINAL AORTIC ANEURYSM REPAIR  11/28/11   stent    ABDOMINAL  AORTIC ANEURYSM REPAIR     APPENDECTOMY     cataracts     EYE SURGERY  2009   Left cataract   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 He has a 76.50 pack-year smoking history. He has never used smokeless tobacco. He reports that he does not drink alcohol or use drugs.  No Known Allergies  Family History  Problem Relation Age of Onset   Cancer Mother    Hypertension Mother    Leukemia Mother    Prostate cancer Father    Cancer Sister    Hypertension Sister    Hypertension Brother    Hypertension Daughter    Heart attack Maternal Grandfather      Prior to Admission medications   Medication Sig Start Date End Date Taking? Authorizing Provider  acetaminophen (TYLENOL) 325 MG tablet Take 650 mg by mouth every 6 (six) hours as needed.    [provider]  allopurinol (ZYLOPRIM) 300 MG tablet Take 1 tablet by mouth daily. 06/24/12   [provider]  ALPRAZolam Duanne Moron) 0.25 MG tablet Take 0.25 mg by mouth 2 (two) times daily as needed for anxiety.    [provider]  amLODipine (NORVASC) 10 MG tablet Take 10 mg by mouth daily. 02/18/19   [provider]  apixaban (ELIQUIS) 5 MG TABS tablet Take 5 mg by mouth 2 (two) times daily.    [provider]  atorvastatin (LIPITOR) 40 MG tablet Take 40 mg by mouth daily. Taking 1/2 tab    [provider]  beta carotene w/minerals (OCUVITE) tablet Take 1 tablet by mouth daily.    [provider]  busPIRone (BUSPAR) 10 MG tablet Take 10 mg by mouth 2 (two) times daily. 04/01/19   [provider]  citalopram (CELEXA) 20 MG tablet Take 20 mg by mouth daily. 04/01/19   [provider]  furosemide (LASIX) 40 MG tablet Take 1 tablet (40 mg total) by mouth daily. 04/12/19 07/11/19  Tobb, Godfrey Pick, DO  HYDROcodone-acetaminophen (NORCO/VICODIN) 5-325 MG  tablet Take 1 tablet by mouth every 8 (eight) hours as needed for pain. 03/22/19   [provider]  isosorbide mononitrate (IMDUR) 30 MG 24 hr tablet Take 1 tablet (30 mg total) by mouth daily. 04/12/19   Tobb, Kardie, DO  metoprolol succinate (TOPROL XL) 25 MG 24 hr tablet Take 0.5 tablets (12.5 mg total) by mouth daily. 04/12/19   Tobb, Kardie, DO  ondansetron (ZOFRAN) 24 MG tablet Take 24 mg by mouth once.    [provider]  pantoprazole (PROTONIX) 40 MG tablet Take 40 mg by mouth 2 (two) times daily.    [provider]  Potassium Chloride ER 20 MEQ TBCR Take 20 mEq by mouth daily. 04/12/19   Tobb, Godfrey Pick, DO  sacubitril-valsartan (ENTRESTO) 24-26 MG Take 1 tablet by mouth 2 (two) times daily.    [provider]  sucralfate (CARAFATE) 1 g tablet Take 1 g by mouth 4 (four) times daily -  with meals and at bedtime.    [provider]  [facility] 160-4.5 MCG/ACT inhaler  04/02/19   [provider]    Physical Exam: Vitals:   04/13/19 1200 04/13/19 1230 04/13/19 1300 04/13/19 1330  BP: 108/68 109/68 126/67 109/62  Pulse: 79 80 84 80  Resp: 18 16 16 16   Temp:      TempSrc:      SpO2: 98% 100% 100% 100%    Constitutional: NAD, calm, comfortable Vitals:   04/13/19 1200 04/13/19 1230 04/13/19 1300 04/13/19 1330  BP: 108/68 109/68 126/67 109/62  Pulse: 79 80 84 80  Resp: 18 16 16 16   Temp:      TempSrc:      SpO2: 98% 100% 100% 100%   General: pleasant, appears comfortable but supporting R. Arm and shoulder while in a seated position Eyes: EOMI, anicteric sclera, visual acuity impaired ENMT: Mucous membranes are moist. Posterior pharynx clear of any exudate or lesions.Normal dentition.  Neck: normal, supple, no masses, no thyromegaly Respiratory: clear to auscultation bilaterally, no wheezing, no crackles. Normal respiratory effort. No accessory muscle use.  Cardiovascular: Regular rate and rhythm, no murmurs / rubs / gallops. No  edema. 2+ pedal pulses.  Abdomen: no tenderness, no masses palpated. No hepatosplenomegaly. Bowel sounds positive.  Musculoskeletal: no clubbing / cyanosis. RUE limited ROM due to pain. Skin: no rashes, lesions, ulcers. No induration Neurologic: No focal motor or sensory deficits appreciated Psychiatric: Normal judgment and insight. Alert and oriented x 3. Normal mood.    Labs on Admission: I have personally reviewed following labs and imaging studies  CBC: Recent Labs  Lab 04/12/19 1153 04/13/19 1203  WBC 11.1* 9.5  HGB 7.1* 7.0*  HCT 24.5* 23.6*  MCV 85 84.3  PLT 449 [id number]   Basic Metabolic Panel: Recent Labs  Lab 04/12/19 1153 04/13/19 1203  NA 135 135  K 5.1 4.1  CL 100 103  CO2 21 20*  GLUCOSE 107* 99  BUN 34* 45*  CREATININE 1.86* 2.21*  CALCIUM 9.4 8.9  MG 2.2  --    GFR: Estimated Creatinine Clearance: 29.4 mL/min (A) (by C-G formula based on SCr of 2.21 mg/dL (H)). Liver Function Tests: Recent Labs  Lab 04/13/19 1203  AST 8*  ALT 11  ALKPHOS 78  BILITOT 0.5  PROT 7.4  ALBUMIN 3.5   No results for input(s): LIPASE, AMYLASE in the last 168 hours. No results for input(s): AMMONIA in the last 168 hours. Coagulation Profile: Recent Labs  Lab 04/13/19 1203  INR 1.3*   Cardiac Enzymes: No results for input(s): CKTOTAL, CKMB, CKMBINDEX, TROPONINI in the last 168 hours. BNP (last 3 results) No results for input(s): PROBNP in the last 8760 hours. HbA1C: No results for input(s): HGBA1C in the last 72 hours. CBG: No results for input(s): GLUCAP in the last 168 hours. Lipid Profile: No results for input(s): CHOL, HDL, LDLCALC, TRIG, CHOLHDL, LDLDIRECT in the last 72 hours. Thyroid Function Tests: No results for input(s): TSH, T4TOTAL, FREET4, T3FREE, THYROIDAB in the last 72 hours. Anemia Panel: No results for input(s): VITAMINB12, FOLATE, FERRITIN, TIBC, IRON, RETICCTPCT in the last 72 hours. Urine analysis:    Component Value Date/Time    COLORURINE YELLOW 11/27/2011 0900   APPEARANCEUR CLEAR 11/27/2011 0900   LABSPEC >  1.046 (H) 11/27/2011 0900   PHURINE 5.5 11/27/2011 0900   GLUCOSEU NEGATIVE 11/27/2011 0900   HGBUR NEGATIVE 11/27/2011 0900   BILIRUBINUR NEGATIVE 11/27/2011 0900   KETONESUR NEGATIVE 11/27/2011 0900   PROTEINUR NEGATIVE 11/27/2011 0900   UROBILINOGEN 0.2 11/27/2011 0900   NITRITE NEGATIVE 11/27/2011 0900   LEUKOCYTESUR NEGATIVE 11/27/2011 0900    Radiological Exams on Admission: No results found.   Assessment/Plan Richard Townsend is a 78 y.o. 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  # Symptomatic Anemia # UGI Bleed (known gastric erosions, duodenal ulcers) - ER consented and ordered for 1 unit of pRBCs, will follow a post-transfusion CBC - GI consulted, have EGD from 9/20 which showed multiple gastric erosions, 3 non bleeding cratered duodenal ulcers with most distal ulcer with visible vessel Rx with epi and clips, 2 other clean based ulcers in the duodenal bulb - IV PPI BID (GI team transitioned patient to gtt) and continued carafate - held Apixaban - maintain at least 2 PIVs - will follow H/H tonight and again in AM  # AKI - baseline Cr between 0.7 to 0.8, creatinine yesterday 1.86 and today 2.21; suspect patient was dry and had his diuretic dose reduced yest by cardiology to 40 mg daily - held entresto - continue to monitor I/O - dose meds appropriately and avoid nephrotoxic agents  # Non-obstructive CAD # ICMP # CHF (recent EF 30%) - follows with cardiology will have re-assessment of EF on Echo as well as evaluation for valvular disease, may need primary prevention with ICD pending eval -  continue atorvastatin, metoprolol and held imdur for now (no chest pain, and borderline hypotensive) - continue lasix 40 mg daily - monitor I/O and electrolytes  # R. Shoulder pain - plain film does not show acute osseous abnormality but does show superior subluxation of the R. Humeral head - consider MRI, patient declined further eval - PT/OT  # Hx of DVT - suspect provoked in setting of hospitalization however will need follow-up with Heme - held apixaban - consider eval of LE for DVT though still very recent - if resuming anticoagulation, pending EGD results, would consider trial with hep gtt after consultation with GI  # Hx of AAA s/p endovascular repair 2013 # Tobacco use - reducing use, continued counseling - on statin  # HLD - continue atorvastatin  DVT prophylaxis: SCDs Code Status: Full Code Disposition Plan: pending Consults called: Gastroenterology, Velora Heckler Admission status: Inpatient   Truddie Hidden MD [facility] Pager [phone]  If 7PM-7AM, please contact night-coverage [email or web address] Password [email or web address]  04/13/2019, 1:36 PM

## 2019-04-13 NOTE — ED Provider Notes (Signed)
[facility] Emergency Department Provider Note MRN:  [id number]  Arrival date & time: 04/13/19     Chief Complaint   Abnormal Lab (low hgb 7.5)   History of Present Illness   Richard Townsend is a 78 y.o. year-old male with a history of AAA, DVT, CHF presenting to the ED with chief complaint of abnormal lab.  Patient reports black stool yesterday.  Evaluated by GI today, told that he has downtrending hemoglobin levels, advised to come to the emergency department for transfusion and admission.  He denies abdominal pain, no fever, no cough, no chest pain, no shortness of breath.  Some recent dyspnea on exertion for the past few days.  Review of Systems  A complete 10 system review of systems was obtained and all systems are negative except as noted in the HPI and PMH.   Patient's Health History    Past Medical History:  Diagnosis Date  . AAA (abdominal aortic aneurysm) ([place]) 11/2011  . Abdominal aneurysm without mention of rupture 01/06/2012  . Abdominal tightness 01/11/2013  . Allergic rhinitis   . Arthritis   . DVT (deep venous thrombosis) ([place])   . Dysmetabolic syndrome   . Generalized osteoarthritis   . GERD (gastroesophageal reflux disease)   . Gout   . Headache(784.0)   . Hypercholesterolemia   . Hypertension   . Lower extremity edema   . Nausea alone 07/13/2013  . 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 Peptic ulcer   . Shortness of breath   . UGI bleed     Past Surgical History:  Procedure Laterality Date  . ABDOMINAL AORTIC ANEURYSM REPAIR  11/28/11   stent   . ABDOMINAL AORTIC ANEURYSM REPAIR    . APPENDECTOMY    . cataracts    . EYE SURGERY  2009   Left cataract  . 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 FOOT SURGERY    . KNEE ARTHROSCOPY    . TONSILLECTOMY      Family History  Problem Relation Age of Onset  . Cancer Mother   . Hypertension Mother   . Leukemia Mother   .  Prostate cancer Father   . Cancer Sister   . Hypertension Sister   . Hypertension Brother   . Hypertension Daughter   . Heart attack Maternal Grandfather     Social History   Socioeconomic History  . Marital status: Married    Spouse name: Not on file  . Number of children: Not on file  . Years of education: Not on file  . Highest education level: Not on file  Occupational History  . Not on file  Social Needs  . Financial resource strain: Not on file  . Food insecurity    Worry: Not on file    Inability: Not on file  . Transportation needs    Medical: No    Non-medical: No  Tobacco Use  . Smoking status: Current Every Day Smoker    Packs/day: 1.50    Years: 51.00    Pack years: 76.50    Types: Cigarettes  . Smokeless tobacco: Never Used  Substance and Sexual Activity  . Alcohol use: No  . Drug use: No  . Sexual activity: Not on file  Lifestyle  . Physical activity    Days per week: Not on file    Minutes per session: Not on file  . Stress: Not on file  Relationships  . Social Herbalist on phone: Not on file    Gets together: Not on file    Attends religious service: Not on file    Active member of club or organization: Not on file    Attends meetings of clubs or organizations: Not on file    Relationship status: Not on file  . Intimate partner violence    Fear of current or ex partner: Not on file    Emotionally abused: Not on file    Physically abused: Not on file    Forced sexual activity: Not on file  Other Topics Concern  . Not on file  Social History Narrative  . Not on file     Physical Exam  Vital Signs and Nursing Notes reviewed Vitals:   04/13/19 1200 04/13/19 1230  BP: 108/68 109/68  Pulse: 79 80  Resp: 18 16  Temp:    SpO2: 98% 100%    CONSTITUTIONAL: Well-appearing, NAD NEURO:  Alert and oriented x 3, no focal deficits EYES:  eyes equal and reactive ENT/NECK:  no LAD, no JVD CARDIO: Regular rate, well-perfused, normal S1  and S2 PULM:  CTAB no wheezing or rhonchi GI/GU:  normal bowel sounds, non-distended, non-tender MSK/SPINE:  No gross deformities, no edema SKIN:  no rash, atraumatic PSYCH:  Appropriate speech and behavior  Diagnostic and Interventional Summary    EKG Interpretation  Date/Time:  Tuesday April 13 2019 11:53:52 EST Ventricular Rate:  82 PR Interval:    QRS Duration: 116 QT Interval:  391 QTC Calculation: 457 R Axis:   18 Text Interpretation: Sinus rhythm Incomplete left bundle branch block ST depression, consider ischemia, lateral lds Borderline ST elevation, anterior leads Baseline wander in lead(s) I III aVL V3 Confirmed by Gerlene Fee [phone]) on 04/13/2019 12:54:31 PM      Labs Reviewed  CBC - Abnormal; Notable for the following components:      Result Value   RBC 2.80 (*)    Hemoglobin 7.0 (*)    HCT 23.6 (*)    MCH 25.0 (*)    MCHC 29.7 (*)    RDW 17.3 (*)    All other components within normal limits  COMPREHENSIVE METABOLIC PANEL - Abnormal; Notable for the following components:   CO2 20 (*)    BUN 45 (*)    Creatinine, Ser 2.21 (*)    AST 8 (*)    GFR calc non Af Amer 28 (*)    GFR calc Af Amer 32 (*)    All other components within normal limits  PROTIME-INR - Abnormal; Notable for the following components:   Prothrombin Time 16.3 (*)    INR 1.3 (*)    All other components within normal limits  SARS CORONAVIRUS 2 (TAT 6-24 HRS)  APTT  TYPE AND SCREEN  PREPARE RBC (CROSSMATCH)    No orders to display    Medications  0.9 %  sodium chloride infusion (has no administration in time range)  sodium chloride 0.9 % bolus 500 mL (has no administration in time range)     Procedures  /  Critical Care .Critical Care Performed by: Maudie Flakes, MD Authorized by: Maudie Flakes, MD   Critical care provider statement:    Critical care time (minutes):  32   Critical care was necessary to treat or prevent imminent or life-threatening deterioration of the  following conditions:  Circulatory failure (Symptomatic anemia requiring blood transfusion)   Critical care was time spent  personally by me on the following activities:  Discussions with consultants, evaluation of patient's response to treatment, examination of patient, ordering and performing treatments and interventions, ordering and review of laboratory studies, ordering and review of radiographic studies, pulse oximetry, re-evaluation of patient's condition, obtaining history from patient or surrogate and review of old charts    ED Course and Medical Decision Making  I have reviewed the triage vital signs and the nursing notes.  Pertinent labs & imaging results that were available during my care of the patient were reviewed by me and considered in my medical decision making (see below for details).  Patient will need admission for presumed GI bleed, already evaluated by GI experts this morning who have made this recommendation for transfusion and admission.  Patient reports that rectal exam was performed in the office with the GI experts this morning and he does not wish to have this exam repeated.  Will transfuse and admit to hospital service.    Barth Kirks. Sedonia Small, MD [place] [email or web address]  Final Clinical Impressions(s) / ED Diagnoses     ICD-10-CM   1. Gastrointestinal hemorrhage with melena  K92.1   2. Symptomatic anemia  D64.9     ED Discharge Orders    None      Discharge Instructions Discussed with and Provided to Patient: Discharge Instructions   None       Maudie Flakes, MD 04/13/19 1256

## 2019-04-13 NOTE — ED Triage Notes (Signed)
Pt called and told to go to ED for blood transfusion due to Hgb 7.5. had labs done yesterday. Pt reports been fatigued for past couple days.

## 2019-04-13 NOTE — Progress Notes (Signed)
Agree with assessment with the following thoughts: History of GI bleeding on anticoagulation, unclear if the patient has had endoscopic workup thus far, he had a cardiac arrest leading to his prior hospitalization. Now with worsening anemia and suspected recent bleeding although the patient is color blind and unclear how much blood loss he has had. Given his cardiac status he does warrant a blood transfusion and I think warrants admission for observation and EGD. Given his EF any endoscopic evaluation must be done at the hospital.   Jaclyn Shaggy can you keep me posted regarding this patient and whether or not he accepts admission. It will be very challenging to manage this as an outpatient. Thanks

## 2019-04-13 NOTE — ED Notes (Signed)
Transport called.

## 2019-04-13 NOTE — Patient Instructions (Signed)
1. Patient sent directly to [facility] ER for further evaluation.  2. Further follow up to be further evaluation to be determined after patient is discharged from the hospital.

## 2019-04-13 NOTE — Progress Notes (Signed)
Patient ID: Richard Townsend, male   DOB: [birth year], 78 y.o.   MRN: [id number]  Pt was seen in our office earlier today  With melena Admitted via ER with GI Bleed  Took Eliquis dose this am HGB 7.1 yesterday, and hgb 7.0 today .  Pt had an active bleed in September 2020 , hospitalized in [place] 02/28/19 -multiple gastric erosions , 3 nonbleeding cratered duodenal ulcers with distal most ulcer having a visible vessel treated with epi and clips, also 2 clean based ulcers duodenal bulb.   Plan; Clear liquids  Hold Eliquis  Ordered protonix infusion  Transfuse pt x 2 given severe underlying cardiac disease  Will need repeat EGD after Eliquis washes out. GI will see in am

## 2019-04-14 ENCOUNTER — Inpatient Hospital Stay (HOSPITAL_COMMUNITY): Payer: Medicare HMO

## 2019-04-14 DIAGNOSIS — Z86718 Personal history of other venous thrombosis and embolism: Secondary | ICD-10-CM

## 2019-04-14 DIAGNOSIS — D62 Acute posthemorrhagic anemia: Secondary | ICD-10-CM

## 2019-04-14 DIAGNOSIS — Z7901 Long term (current) use of anticoagulants: Secondary | ICD-10-CM

## 2019-04-14 DIAGNOSIS — K260 Acute duodenal ulcer with hemorrhage: Secondary | ICD-10-CM

## 2019-04-14 HISTORY — PX: IR IVC FILTER PLMT / S&I /IMG GUID/MOD SED: IMG701

## 2019-04-14 LAB — CBC
HCT: 27.5 % — ABNORMAL LOW (ref 39.0–52.0)
HCT: 27.8 % — ABNORMAL LOW (ref 39.0–52.0)
Hemoglobin: 8.4 g/dL — ABNORMAL LOW (ref 13.0–17.0)
Hemoglobin: 8.4 g/dL — ABNORMAL LOW (ref 13.0–17.0)
MCH: 25.8 pg — ABNORMAL LOW (ref 26.0–34.0)
MCH: 26.1 pg (ref 26.0–34.0)
MCHC: 30.2 g/dL (ref 30.0–36.0)
MCHC: 30.5 g/dL (ref 30.0–36.0)
MCV: 85.3 fL (ref 80.0–100.0)
MCV: 85.4 fL (ref 80.0–100.0)
Platelets: 337 10*3/uL (ref 150–400)
Platelets: 344 10*3/uL (ref 150–400)
RBC: 3.22 MIL/uL — ABNORMAL LOW (ref 4.22–5.81)
RBC: 3.26 MIL/uL — ABNORMAL LOW (ref 4.22–5.81)
RDW: 16.8 % — ABNORMAL HIGH (ref 11.5–15.5)
RDW: 16.8 % — ABNORMAL HIGH (ref 11.5–15.5)
WBC: 8.9 10*3/uL (ref 4.0–10.5)
WBC: 9 10*3/uL (ref 4.0–10.5)
nRBC: 0 % (ref 0.0–0.2)
nRBC: 0 % (ref 0.0–0.2)

## 2019-04-14 LAB — BASIC METABOLIC PANEL
Anion gap: 12 (ref 5–15)
Anion gap: 10 (ref 5–15)
BUN: 35 mg/dL — ABNORMAL HIGH (ref 8–23)
BUN: 35 mg/dL — ABNORMAL HIGH (ref 8–23)
CO2: 19 mmol/L — ABNORMAL LOW (ref 22–32)
CO2: 21 mmol/L — ABNORMAL LOW (ref 22–32)
Calcium: 8.9 mg/dL (ref 8.9–10.3)
Calcium: 8.6 mg/dL — ABNORMAL LOW (ref 8.9–10.3)
Chloride: 107 mmol/L (ref 98–111)
Chloride: 107 mmol/L (ref 98–111)
Creatinine, Ser: 1.69 mg/dL — ABNORMAL HIGH (ref 0.61–1.24)
Creatinine, Ser: 1.86 mg/dL — ABNORMAL HIGH (ref 0.61–1.24)
GFR calc Af Amer: 45 mL/min — ABNORMAL LOW (ref >60)
GFR calc Af Amer: 40 mL/min — ABNORMAL LOW (ref >60)
GFR calc non Af Amer: 39 mL/min — ABNORMAL LOW (ref >60)
GFR calc non Af Amer: 34 mL/min — ABNORMAL LOW (ref >60)
Glucose, Bld: 95 mg/dL (ref 70–99)
Glucose, Bld: 123 mg/dL — ABNORMAL HIGH (ref 70–99)
Potassium: 3.7 mmol/L (ref 3.5–5.1)
Potassium: 3.7 mmol/L (ref 3.5–5.1)
Sodium: 138 mmol/L (ref 135–145)
Sodium: 138 mmol/L (ref 135–145)

## 2019-04-14 LAB — TYPE AND SCREEN
ABO/RH(D): A POS
Antibody Screen: NEGATIVE
Unit division: 0
Unit division: 0

## 2019-04-14 LAB — BPAM RBC
Blood Product Expiration Date: 202011112359
Blood Product Expiration Date: 202011282359
ISSUE DATE / TIME: 202011031456
ISSUE DATE / TIME: 202011032120
Unit Type and Rh: 6200
Unit Type and Rh: 6200

## 2019-04-14 LAB — HEMOGLOBIN AND HEMATOCRIT, BLOOD
HCT: 28.4 % — ABNORMAL LOW (ref 39.0–52.0)
HCT: 26.6 % — ABNORMAL LOW (ref 39.0–52.0)
Hemoglobin: 8.6 g/dL — ABNORMAL LOW (ref 13.0–17.0)
Hemoglobin: 8.1 g/dL — ABNORMAL LOW (ref 13.0–17.0)

## 2019-04-14 LAB — TROPONIN I (HIGH SENSITIVITY)
Troponin I (High Sensitivity): 69 ng/L — ABNORMAL HIGH (ref <18)
Troponin I (High Sensitivity): 78 ng/L — ABNORMAL HIGH (ref <18)

## 2019-04-14 LAB — MAGNESIUM: Magnesium: 1.9 mg/dL (ref 1.7–2.4)

## 2019-04-14 MED ORDER — MORPHINE SULFATE (PF) 2 MG/ML IV SOLN: 0.5000 mg | INTRAVENOUS | Status: DC | PRN

## 2019-04-14 MED ORDER — HYDROCODONE-ACETAMINOPHEN 5-325 MG PO TABS
1.0000 | ORAL_TABLET | Freq: Four times a day (QID) | ORAL | Status: DC | PRN
  Administered 2019-04-14: 21:00:00 1 via ORAL
  Filled 2019-04-14: qty 1

## 2019-04-14 MED ORDER — LIDOCAINE HCL 1 % IJ SOLN
INTRAMUSCULAR | Status: AC
  Filled 2019-04-14: qty 20

## 2019-04-14 MED ORDER — FENTANYL CITRATE (PF) 100 MCG/2ML IJ SOLN
INTRAMUSCULAR | Status: AC | PRN
  Administered 2019-04-14: 50 ug via INTRAVENOUS

## 2019-04-14 MED ORDER — FENTANYL CITRATE (PF) 100 MCG/2ML IJ SOLN
INTRAMUSCULAR | Status: AC
  Filled 2019-04-14: qty 2

## 2019-04-14 MED ORDER — NICOTINE 21 MG/24HR TD PT24
21.0000 mg | MEDICATED_PATCH | Freq: Every day | TRANSDERMAL | Status: DC
  Administered 2019-04-15: 09:00:00 21 mg via TRANSDERMAL
  Filled 2019-04-14: qty 1

## 2019-04-14 MED ORDER — TRAMADOL HCL 50 MG PO TABS: 50.0000 mg | ORAL_TABLET | Freq: Four times a day (QID) | ORAL | Status: DC | PRN

## 2019-04-14 MED ORDER — SODIUM CHLORIDE 0.9 % IV SOLN: INTRAVENOUS | Status: DC

## 2019-04-14 NOTE — Progress Notes (Signed)
PT Cancellation Note  Patient Details Name: Richard Townsend MRN: [id number] DOB: 1940/11/24   Cancelled Treatment:    Reason Eval/Treat Not Completed: Medical issues which prohibited therapy, Positive  For DVT, for EGD. Check back another day.   Claretha Cooper 04/14/2019, 12:05 PM  [place] Pager [phone] Office [phone]

## 2019-04-14 NOTE — Progress Notes (Signed)
PROGRESS NOTE    Richard Townsend  [id number] DOB: [birth year] DOA: 04/13/2019 PCP: No primary care provider on file.    Brief Narrative:  78 year old gentleman prior history of hypertension hyperlipidemia coronary artery disease, ischemic cardiomyopathy, chronic systolic heart failure with left ventricular ejection fraction of 30%, AAA repair in 2013, visual impairment due to macular degeneration, initially presented to to [facility] and subsequently to [facility] for melena And pneumonia and hospital course was complicated with a DVT without any pulmonary embolism.  He was started on Eliquis for his DVT and discharged home.  Patient was brought in by his son-in-law this admission for black tarry stools.  He was found to be anemic and admitted for further evaluation. Assessment & Plan:   Active Problems:   GI bleed   Anemia of blood loss secondary to possibly upper GI bleed with his prior history of gastric erosions and duodenal ulcers. S/p 1 unit of PRBC transfusion and hemoglobin greater than 7.  Continue with IV PPI twice daily. Gastroenterology consulted and plan for EGD tomorrow. Continue to hold Eliquis.   AKI probably secondary to GI bleed.  Baseline creatinine less than 1 and creatinine on admission 1.86, 2.21. Transfuse 1 unit of PRBC on admission and hold Entresto and avoid nephrotoxic agents for now.  Continue to monitor BMP and if no improvement will follow up with ultrasound renal and renal electrolytes.    Right shoulder pain Plain films show superior subluxation of the right humeral head.  Plan for MRI of the shoulder but patient declined further evaluation. We will get orthopedics to look at the films and to see if he needs a sling placement. PT/OT evaluation ordered.    Acute DVT on the right lower extremity With acute GI bleed we will get IR consult for filter placement as Eliquis is on hold at this time.   History of AAA s/p endovascular  repair in 2013. Outpatient follow-up with vascular and continue with statin.    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 Primary prevention with ICD pending evaluation.  Recommend outpatient follow-up with cardiology Continue with statin and beta-blocker for now.  Delene Loll is on hold for AKI. He appears to be compensated.    DVT prophylaxis: SCDs Code Status: Full code Family Communication: Discussed with son-in-law at bedside Disposition Plan: Pending further evaluation by gastroenterology   Consultants:   IR,  Gastroenterology  Procedures: EGD scheduled for tomorrow  Antimicrobials: None   Subjective: Patient wants to go home is adamant about going home tomorrow after the EGD.  He denies any other complaints  Objective: Vitals:   04/13/19 2132 04/13/19 2149 04/14/19 0025 04/14/19 0538  BP: 110/65 114/62 108/73 115/76  Pulse: 93 90 92 79  Resp: 18 18 18 18   Temp: (!) 97.5 F (36.4 C) 98.2 F (36.8 C) 97.9 F (36.6 C) 97.6 F (36.4 C)  TempSrc: Oral Oral Oral Oral  SpO2: 100% 99% 98% 100%  Weight:      Height:        Intake/Output Summary (Last 24 hours) at 04/14/2019 1316 Last data filed at 04/14/2019 0957 Gross per 24 hour  Intake 2103.72 ml  Output 1025 ml  Net 1078.72 ml   Filed Weights   04/13/19 1930  Weight: 83.5 kg    Examination:  General exam: Not in any kind of distress but adamant about going home Respiratory system: Clear  to auscultation. Respiratory effort normal. Cardiovascular system: S1 & S2 heard, RRR. No JVD,  Gastrointestinal system: Abdomen is nondistended, soft and nontender. No organomegaly or masses felt. Normal bowel sounds heard. Central nervous system: Alert and oriented. No focal neurological deficits. Extremities: Symmetric 5 x 5 power. Skin: No rashes, lesions or ulcers Psychiatry: Agitated and  restless    Data Reviewed: I have personally reviewed following labs and imaging studies  CBC: Recent Labs  Lab 04/12/19 1153 04/13/19 1203 04/13/19 1818 04/14/19 0305 04/14/19 1153  WBC 11.1* 9.5  --  8.9   9.0  --   HGB 7.1* 7.0* 8.0* 8.4*   8.4* 8.6*  HCT 24.5* 23.6* 26.4* 27.8*   27.5* 28.4*  MCV 85 84.3  --  85.3   85.4  --   PLT 449 379  --  337   344  --    Basic Metabolic Panel: Recent Labs  Lab 04/12/19 1153 04/13/19 1203 04/14/19 0305  NA 135 135 138  K 5.1 4.1 3.7  CL 100 103 107  CO2 21 20* 19*  GLUCOSE 107* 99 95  BUN 34* 45* 35*  CREATININE 1.86* 2.21* 1.69*  CALCIUM 9.4 8.9 8.9  MG 2.2  --   --    GFR: Estimated Creatinine Clearance: 38.4 mL/min (A) (by C-G formula based on SCr of 1.69 mg/dL (H)). Liver Function Tests: Recent Labs  Lab 04/13/19 1203  AST 8*  ALT 11  ALKPHOS 78  BILITOT 0.5  PROT 7.4  ALBUMIN 3.5   No results for input(s): LIPASE, AMYLASE in the last 168 hours. No results for input(s): AMMONIA in the last 168 hours. Coagulation Profile: Recent Labs  Lab 04/13/19 1203  INR 1.3*   Cardiac Enzymes: No results for input(s): CKTOTAL, CKMB, CKMBINDEX, TROPONINI in the last 168 hours. BNP (last 3 results) No results for input(s): PROBNP in the last 8760 hours. HbA1C: No results for input(s): HGBA1C in the last 72 hours. CBG: No results for input(s): GLUCAP in the last 168 hours. Lipid Profile: No results for input(s): CHOL, HDL, LDLCALC, TRIG, CHOLHDL, LDLDIRECT in the last 72 hours. Thyroid Function Tests: No results for input(s): TSH, T4TOTAL, FREET4, T3FREE, THYROIDAB in the last 72 hours. Anemia Panel: No results for input(s): VITAMINB12, FOLATE, FERRITIN, TIBC, IRON, RETICCTPCT in the last 72 hours. Sepsis Labs: No results for input(s): PROCALCITON, LATICACIDVEN in the last 168 hours.  Recent Results (from the past 240 hour(s))  SARS CORONAVIRUS 2 (TAT 6-24 HRS) Nasopharyngeal Nasopharyngeal Swab     Status: None     Collection Time: 04/13/19  1:05 PM   Specimen: Nasopharyngeal Swab  Result Value Ref Range Status   SARS Coronavirus 2 NEGATIVE NEGATIVE Final    Comment: (NOTE) SARS-CoV-2 target nucleic acids are NOT DETECTED. The SARS-CoV-2 RNA is generally detectable in upper and lower respiratory specimens during the acute phase of infection. Negative results do not preclude SARS-CoV-2 infection, do not rule out co-infections with other pathogens, and should not be used as the sole basis for treatment or other patient management decisions. Negative results must be combined with clinical observations, patient history, and epidemiological information. The expected result is Negative. Fact Sheet for Patients: SugarRoll.be Fact Sheet for Healthcare Providers: https://www.woods-mathews.com/ This test is not yet approved or cleared by the [place] FDA and  has been authorized for detection and/or diagnosis of SARS-CoV-2 by FDA under an Emergency Use Authorization (EUA). This EUA will remain  in effect (meaning this test can be used)  for the duration of the COVID-19 declaration under Section 56 4(b)(1) of the Act, 21 U.S.C. section 360bbb-3(b)(1), unless the authorization is terminated or revoked sooner. Performed at [facility], [place] [address] [place], [place] [address]          Radiology Studies: Dg Shoulder Right  Result Date: 04/13/2019 CLINICAL DATA:  78 year old male with acute onset right shoulder pain while lifting arm, ?Heard a pop?Marland Kitchen EXAM: RIGHT SHOULDER - 2+ VIEW COMPARISON:  Portable chest 11/28/2011. [facility] Portable chest 03/20/2019. FINDINGS: No glenohumeral joint dislocation. Superior subluxation of the humeral head compared to the glenoid. Bone mineralization is within normal limits for age. The right clavicle and scapula appear intact. Intact proximal right humerus. Intact visible right ribs. IMPRESSION: No acute  osseous abnormality identified about the right shoulder. Superior subluxation of the right humeral head suggests rotator cuff pathology. Electronically Signed   By: Genevie Ann M.D.   On: 04/13/2019 14:23   Vas [place] Lower Extremity Venous (dvt)  Result Date: 04/14/2019  Lower Venous Study Indications: History of DVT w/GI Bleed.  Risk Factors: None identified. Anticoagulation: Eliquis. Comparison Study: CTA from [place] noted L DVT Performing Technologist: Oliver Hum RVT  Examination Guidelines: A complete evaluation includes B-mode imaging, spectral Doppler, color Doppler, and power Doppler as needed of all accessible portions of each vessel. Bilateral testing is considered an integral part of a complete examination. Limited examinations for reoccurring indications may be performed as noted.  +-----+---------------+---------+-----------+----------+--------------+  RIGHT Compressibility Phasicity Spontaneity Properties Thrombus Aging  +-----+---------------+---------+-----------+----------+--------------+  CFV   Full            Yes       Yes                                    +-----+---------------+---------+-----------+----------+--------------+   +---------+---------------+---------+-----------+----------+--------------+  LEFT      Compressibility Phasicity Spontaneity Properties Thrombus Aging  +---------+---------------+---------+-----------+----------+--------------+  CFV       Partial         Yes       Yes                    Acute           +---------+---------------+---------+-----------+----------+--------------+  SFJ       Full                                                             +---------+---------------+---------+-----------+----------+--------------+  FV Prox   Full                                                             +---------+---------------+---------+-----------+----------+--------------+  FV Mid    Full                                                              +---------+---------------+---------+-----------+----------+--------------+  FV Distal Full                                                             +---------+---------------+---------+-----------+----------+--------------+  PFV       Full                                                             +---------+---------------+---------+-----------+----------+--------------+  POP       Full            Yes       Yes                                    +---------+---------------+---------+-----------+----------+--------------+  PTV       Full                                                             +---------+---------------+---------+-----------+----------+--------------+  PERO      Partial                                          Acute           +---------+---------------+---------+-----------+----------+--------------+  EIV       Full                                                             +---------+---------------+---------+-----------+----------+--------------+     Summary: Right: No evidence of common femoral vein obstruction. Left: Findings consistent with acute deep vein thrombosis involving the left common femoral vein, and left peroneal veins. No cystic structure found in the popliteal fossa.  *See table(s) above for measurements and observations.    Preliminary         Scheduled Meds:  atorvastatin  20 mg Oral Daily   furosemide  40 mg Oral Daily   metoprolol succinate  12.5 mg Oral Daily   multivitamin  1 tablet Oral Daily   sodium chloride flush  3 mL Intravenous Q12H   sucralfate  1 g Oral TID WC & HS   Continuous Infusions:  pantoprozole (PROTONIX) infusion 8 mg/hr (04/14/19 0433)     LOS: 1 day        Hosie Poisson, MD [facility] Pager [phone]  If 7PM-7AM, please contact night-coverage [email or web address] Password [email or web address] 04/14/2019, 1:16 PM

## 2019-04-14 NOTE — Progress Notes (Signed)
Pt c/o chest tightness, EKG completed with abnormal results, paged to Dr. Karleen Hampshire with new orders made.

## 2019-04-14 NOTE — Progress Notes (Addendum)
Patient ID: Richard Townsend, male   DOB: August 10, 1940, 78 y.o.   MRN: [id number]    Progress Note   Subjective  Day #2  CC; melena , weakness  Patient says he feels fine, he denies any abdominal discomfort.  He says he starving and needs some food.  Says he did have a bowel movement this morning which appeared dark brown. Patient is very excitable, says he does not want to stay in the hospital another night.  He is upset about being on Eliquis and says he should have never been put on it in the first place.  HGB 8.4 this am  After one unit   Objective   Vital signs in last 24 hours: Temp:  [97.5 F (36.4 C)-98.2 F (36.8 C)] 97.6 F (36.4 C) (11/04 0538) Pulse Rate:  [74-93] 79 (11/04 0538) Resp:  [14-24] 18 (11/04 0538) BP: (102-126)/(54-76) 115/76 (11/04 0538) SpO2:  [97 %-100 %] 100 % (11/04 0538) Weight:  [83.5 kg-83.6 kg] 83.5 kg (11/03 1930) Last BM Date: 04/13/19 General:    Elderly white male n NAD, pleasant and talkative Heart:  Regular rate and rhythm; no murmurs Lungs: Respirations even and unlabored, lungs CTA bilaterally-few basilar crackles Abdomen:  Soft, nontender and nondistended. Normal bowel sounds. Extremities:  Without edema. Neurologic:  Alert and oriented,  grossly normal neurologically. Psych:  Cooperative. Normal mood and affect.  Intake/Output from previous day: 11/03 0701 - 11/04 0700 In: 2103.7 [I.V.:869.2; Blood:734.5; IV Piggyback:500] Out: 825 [Urine:825] Intake/Output this shift: No intake/output data recorded.  Lab Results: Recent Labs    04/12/19 1153 04/13/19 1203 04/13/19 1818 04/14/19 0305  WBC 11.1* 9.5  --  8.9  9.0  HGB 7.1* 7.0* 8.0* 8.4*  8.4*  HCT 24.5* 23.6* 26.4* 27.8*  27.5*  PLT 449 379  --  337  344   BMET Recent Labs    04/12/19 1153 04/13/19 1203 04/14/19 0305  NA 135 135 138  K 5.1 4.1 3.7  CL 100 103 107  CO2 21 20* 19*  GLUCOSE 107* 99 95  BUN 34* 45* 35*  CREATININE 1.86* 2.21* 1.69*  CALCIUM 9.4  8.9 8.9   LFT Recent Labs    04/13/19 1203  PROT 7.4  ALBUMIN 3.5  AST 8*  ALT 11  ALKPHOS 78  BILITOT 0.5   PT/INR Recent Labs    04/13/19 1203  LABPROT 16.3*  INR 1.3*    Studies/Results: Dg Shoulder Right  Result Date: 04/13/2019 CLINICAL DATA:  78 year old male with acute onset right shoulder pain while lifting arm, ?Heard a pop?Marland Kitchen EXAM: RIGHT SHOULDER - 2+ VIEW COMPARISON:  Portable chest 11/28/2011. [facility] Portable chest 03/20/2019. FINDINGS: No glenohumeral joint dislocation. Superior subluxation of the humeral head compared to the glenoid. Bone mineralization is within normal limits for age. The right clavicle and scapula appear intact. Intact proximal right humerus. Intact visible right ribs. IMPRESSION: No acute osseous abnormality identified about the right shoulder. Superior subluxation of the right humeral head suggests rotator cuff pathology. Electronically Signed   By: Genevie Ann M.D.   On: 04/13/2019 14:23       Assessment / Plan:    #58 78 year old white male admitted yesterday with melena in setting of Eliquis use, and hemoglobin of 7.1. Patient had an acute bleed September 2020 and was hospitalized in [place], and found on EGD to have 3 cratered duodenal ulcers, 1 of which had a overlying clot and visible vessel which was treated with epi and endoclipping.  Also with 2 clean-based ulcers in the proximal duodenal bulb.  Patient has been on PPI therapy twice daily since.  Suspect slow GI bleed secondary to nonhealing ulcer or ulcers  Ulcers felt secondary to aspirin/BC powder overuse  #2 anemia secondary to above-stable today after transfusion x1  #3 recent DVT left lower extremity, patient had CTA done at [facility] 03/21/2019, no PE-placed on Eliquis 5 mg twice daily  #4 coronary artery disease, recent cath, no stent #5 ischemic cardiomyopathy-recent echo with EF 30%  Plan; Last dose of Eliquis yesterday morning, needs to washout for  36 to 48 hours.  Will feed patient today, n.p.o. after midnight, and have scheduled EGD with Dr. Tarri Glenn at 7:30 AM tomorrow. Continue IV PPI infusion Serial hemoglobins and transfuse to keep hemoglobin in the 8 range  Will order left lower extremity Doppler, to reassess previously diagnosed left lower extremity DVT, as decision will need to be made regarding feasibility of putting him back on Eliquis.   Active Problems:   GI bleed   LOS: 1 day   Amy Esterwood PA-C 04/14/2019, 8:57 AM  GI ATTENDING  Interval history and data reviewed.  Patient personally seen and examined.  Clinically stable upper GI bleed secondary to known duodenal ulcers.  Recent endoscopic hemostatic therapy.  On PPI.  Has responded appropriately to transfusion of 1 unit of packed red blood cells.  Physical exam unremarkable.  Plans for upper endoscopy with the possible need for additional hemostatic therapy tomorrow.  Should be adequate washout time for Eliquis (he does have an element of renal insufficiency).  Examination has been scheduled with Dr. Tarri Glenn.  Patient is high risk given his age, comorbidities, and chronic anticoagulation requirements.  The nature of the procedure, as well as the risks, benefits, and alternatives were carefully and thoroughly reviewed with the patient. Ample time for discussion and questions allowed. The patient understood, was satisfied, and agreed to proceed.  Docia Chuck. Geri Seminole., M.D. [facility] Division of Gastroenterology

## 2019-04-14 NOTE — H&P (View-Only) (Signed)
Patient ID: Richard Townsend, male   DOB: 09/20/1940, 78 y.o.   MRN: [id number]    Progress Note   Subjective  Day #2  CC; melena , weakness  Patient says he feels fine, he denies any abdominal discomfort.  He says he starving and needs some food.  Says he did have a bowel movement this morning which appeared dark brown. Patient is very excitable, says he does not want to stay in the hospital another night.  He is upset about being on Eliquis and says he should have never been put on it in the first place.  HGB 8.4 this am  After one unit   Objective   Vital signs in last 24 hours: Temp:  [97.5 F (36.4 C)-98.2 F (36.8 C)] 97.6 F (36.4 C) (11/04 0538) Pulse Rate:  [74-93] 79 (11/04 0538) Resp:  [14-24] 18 (11/04 0538) BP: (102-126)/(54-76) 115/76 (11/04 0538) SpO2:  [97 %-100 %] 100 % (11/04 0538) Weight:  [83.5 kg-83.6 kg] 83.5 kg (11/03 1930) Last BM Date: 04/13/19 General:    Elderly white male n NAD, pleasant and talkative Heart:  Regular rate and rhythm; no murmurs Lungs: Respirations even and unlabored, lungs CTA bilaterally-few basilar crackles Abdomen:  Soft, nontender and nondistended. Normal bowel sounds. Extremities:  Without edema. Neurologic:  Alert and oriented,  grossly normal neurologically. Psych:  Cooperative. Normal mood and affect.  Intake/Output from previous day: 11/03 0701 - 11/04 0700 In: 2103.7 [I.V.:869.2; Blood:734.5; IV Piggyback:500] Out: 825 [Urine:825] Intake/Output this shift: No intake/output data recorded.  Lab Results: Recent Labs    04/12/19 1153 04/13/19 1203 04/13/19 1818 04/14/19 0305  WBC 11.1* 9.5  --  8.9  9.0  HGB 7.1* 7.0* 8.0* 8.4*  8.4*  HCT 24.5* 23.6* 26.4* 27.8*  27.5*  PLT 449 379  --  337  344   BMET Recent Labs    04/12/19 1153 04/13/19 1203 04/14/19 0305  NA 135 135 138  K 5.1 4.1 3.7  CL 100 103 107  CO2 21 20* 19*  GLUCOSE 107* 99 95  BUN 34* 45* 35*  CREATININE 1.86* 2.21* 1.69*  CALCIUM 9.4  8.9 8.9   LFT Recent Labs    04/13/19 1203  PROT 7.4  ALBUMIN 3.5  AST 8*  ALT 11  ALKPHOS 78  BILITOT 0.5   PT/INR Recent Labs    04/13/19 1203  LABPROT 16.3*  INR 1.3*    Studies/Results: Dg Shoulder Right  Result Date: 04/13/2019 CLINICAL DATA:  78 year old male with acute onset right shoulder pain while lifting arm, ?Heard a pop?Marland Kitchen EXAM: RIGHT SHOULDER - 2+ VIEW COMPARISON:  Portable chest 11/28/2011. [facility] Portable chest 03/20/2019. FINDINGS: No glenohumeral joint dislocation. Superior subluxation of the humeral head compared to the glenoid. Bone mineralization is within normal limits for age. The right clavicle and scapula appear intact. Intact proximal right humerus. Intact visible right ribs. IMPRESSION: No acute osseous abnormality identified about the right shoulder. Superior subluxation of the right humeral head suggests rotator cuff pathology. Electronically Signed   By: Genevie Ann M.D.   On: 04/13/2019 14:23       Assessment / Plan:    #15 78 year old white male admitted yesterday with melena in setting of Eliquis use, and hemoglobin of 7.1. Patient had an acute bleed September 2020 and was hospitalized in [place], and found on EGD to have 3 cratered duodenal ulcers, 1 of which had a overlying clot and visible vessel which was treated with epi and endoclipping.  Also with 2 clean-based ulcers in the proximal duodenal bulb.  Patient has been on PPI therapy twice daily since.  Suspect slow GI bleed secondary to nonhealing ulcer or ulcers  Ulcers felt secondary to aspirin/BC powder overuse  #2 anemia secondary to above-stable today after transfusion x1  #3 recent DVT left lower extremity, patient had CTA done at [facility] 03/21/2019, no PE-placed on Eliquis 5 mg twice daily  #4 coronary artery disease, recent cath, no stent #5 ischemic cardiomyopathy-recent echo with EF 30%  Plan; Last dose of Eliquis yesterday morning, needs to washout for  36 to 48 hours.  Will feed patient today, n.p.o. after midnight, and have scheduled EGD with Dr. Tarri Glenn at 7:30 AM tomorrow. Continue IV PPI infusion Serial hemoglobins and transfuse to keep hemoglobin in the 8 range  Will order left lower extremity Doppler, to reassess previously diagnosed left lower extremity DVT, as decision will need to be made regarding feasibility of putting him back on Eliquis.   Active Problems:   GI bleed   LOS: 1 day   Amy Esterwood PA-C 04/14/2019, 8:57 AM  GI ATTENDING  Interval history and data reviewed.  Patient personally seen and examined.  Clinically stable upper GI bleed secondary to known duodenal ulcers.  Recent endoscopic hemostatic therapy.  On PPI.  Has responded appropriately to transfusion of 1 unit of packed red blood cells.  Physical exam unremarkable.  Plans for upper endoscopy with the possible need for additional hemostatic therapy tomorrow.  Should be adequate washout time for Eliquis (he does have an element of renal insufficiency).  Examination has been scheduled with Dr. Tarri Glenn.  Patient is high risk given his age, comorbidities, and chronic anticoagulation requirements.  The nature of the procedure, as well as the risks, benefits, and alternatives were carefully and thoroughly reviewed with the patient. Ample time for discussion and questions allowed. The patient understood, was satisfied, and agreed to proceed.  Docia Chuck. Geri Seminole., M.D. [facility] Division of Gastroenterology

## 2019-04-14 NOTE — Evaluation (Signed)
Occupational Therapy Evaluation Patient Details Name: Richard Townsend MRN: [id number] DOB: July 07, 1940 Today's Date: 04/14/2019    History of Present Illness Richard Townsend is a 78 y.o. male smoker with history of AAA, arthritis, GERD, gout, hypertension, hyperlipidemia, recent left lower extremity DVT on Eliquis, coronary artery disease, ischemic cardiomyopathy.  Patient presented to [facility] yesterday with melena.  He had prior epinephrine injection and clipping of cratered duodenal ulcer in [place] in September of this year.  He has received transfusion since admission and current hemoglobin is 8.6.  Left lower extremity vascular ultrasound today revealed acute DVT involving left common femoral and left peroneal veins.  He is scheduled for EGD in a.m.  11/4 Pt does have a blood clot and MD has requested a consult for IVC filter   Clinical Impression   Pt admitted with GI bleed. Pt currently with functional limitations due to the deficits listed below (see OT Problem List).  Pt will benefit from skilled OT to increase their safety and independence with ADL and functional mobility for ADL to facilitate discharge to venue listed below.   Pt overall agitated regarding being in the hospital.  Pt frustrated with entire situation.  Pts wife in rehab and he wants to be home.  Pt also with blood clot and has been up moving around. Pt hates the bed and the chair and prefers the couch.  Son in law present.  OT and RN explained pt needed to lie down with BLE elevated due to blood clot and not being on a blood thinner.               Follow Up Recommendations  Home health OT;Supervision/Assistance - 24 hour    Equipment Recommendations  None recommended by OT    Recommendations for Other Services       Precautions / Restrictions Precautions Precautions: Fall      Mobility Bed Mobility Overal bed mobility: Modified Independent                Transfers Overall transfer  level: Needs assistance   Transfers: Sit to/from Stand;Stand Pivot Transfers Sit to Stand: Supervision Stand pivot transfers: Supervision       General transfer comment: S due to decreased vision and safety    Balance Overall balance assessment: Mild deficits observed, not formally tested                                         ADL either performed or assessed with clinical judgement   ADL Overall ADL's : Needs assistance/impaired                                       General ADL Comments: pt overall S with ADL activity with VC due to low vision.  Pt moves quickly and needs Vc due to vision.  Pt not a fan of the hospital and ready to go home.     Vision Baseline Vision/History: Legally blind              Pertinent Vitals/Pain Pain Assessment: No/denies pain     Hand Dominance     Extremity/Trunk Assessment Upper Extremity Assessment Upper Extremity Assessment: Generalized weakness           Communication Communication Communication: No difficulties   Cognition  Arousal/Alertness: Awake/alert Behavior During Therapy: Agitated;WFL for tasks assessed/performed Overall Cognitive Status: Within Functional Limits for tasks assessed                                                Home Living Family/patient expects to be discharged to:: Private residence Living Arrangements: Spouse/significant other Available Help at Discharge: Family Type of Home: House       Home Layout: One level     Bathroom Shower/Tub: Tub/shower unit;Walk-in shower         Home Equipment: Walker - 2 wheels          Prior Functioning/Environment Level of Independence: Independent                 OT Problem List: Impaired vision/perception;Decreased safety awareness;Decreased knowledge of use of DME or AE;Decreased knowledge of precautions      OT Treatment/Interventions: Self-care/ADL training;Patient/family  education;Therapeutic activities;DME and/or AE instruction    OT Goals(Current goals can be found in the care plan section) Acute Rehab OT Goals Patient Stated Goal: home tomorrow OT Goal Formulation: With patient Time For Goal Achievement: 04/21/19 Potential to Achieve Goals: Good  OT Frequency: Min 2X/week    AM-PAC OT "6 Clicks" Daily Activity     Outcome Measure Help from another person eating meals?: None Help from another person taking care of personal grooming?: A Little Help from another person toileting, which includes using toliet, bedpan, or urinal?: A Little Help from another person bathing (including washing, rinsing, drying)?: A Little Help from another person to put on and taking off regular upper body clothing?: A Little Help from another person to put on and taking off regular lower body clothing?: A Little 6 Click Score: 19   End of Session Nurse Communication: Mobility status  Activity Tolerance: Treatment limited secondary to agitation(and frustration over being in the hospital) Patient left: Other (comment)(on couch with son in law present)  OT Visit Diagnosis: Muscle weakness (generalized) (M62.81);Low vision, both eyes (H54.2)                Time: 0200-0214 OT Time Calculation (min): 14 min Charges:  OT General Charges $OT Visit: 1 Visit OT Evaluation $OT Eval Moderate Complexity: 1 Mod  Kari Baars, [place] [phone] Office- [phone]     Boley, Edwena Felty D 04/14/2019, 3:09 PM

## 2019-04-14 NOTE — Consult Note (Signed)
Chief Complaint: Patient was seen in consultation today for IVC filter placement Chief Complaint  Patient presents with  . Abnormal Lab    low hgb 7.5    Referring Physician(s): Akula,V   Supervising Physician: Arne Cleveland  Patient Status: [facility] - In-pt  History of Present Illness: Richard Townsend is a 78 y.o. male smoker with history of AAA, arthritis, GERD, gout, hypertension, hyperlipidemia, recent left lower extremity DVT on Eliquis, coronary artery disease, ischemic cardiomyopathy.  Patient presented to [facility] yesterday with melena.  He had prior epinephrine injection and clipping of cratered duodenal ulcer in [place] in September of this year.  He has received transfusion since admission and current hemoglobin is 8.6.  Left lower extremity vascular ultrasound today revealed acute DVT involving left common femoral and left peroneal veins.  He is scheduled for EGD in a.m.  Request now received for IVC filter placement.  Past Medical History:  Diagnosis Date  . AAA (abdominal aortic aneurysm) ([place]) 11/2011  . Abdominal aneurysm without mention of rupture 01/06/2012  . Abdominal tightness 01/11/2013  . Allergic rhinitis   . Arthritis   . DVT (deep venous thrombosis) ([place])   . Dysmetabolic syndrome   . Generalized osteoarthritis   . GERD (gastroesophageal reflux disease)   . Gout   . Headache(784.0)   . Hypercholesterolemia   . Hypertension   . Lower extremity edema   . Nausea alone 07/13/2013  . 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 Peptic ulcer   . Shortness of breath   . UGI bleed     Past Surgical History:  Procedure Laterality Date  . ABDOMINAL AORTIC ANEURYSM REPAIR  11/28/11   stent   . ABDOMINAL AORTIC ANEURYSM REPAIR    . APPENDECTOMY    . cataracts    . EYE SURGERY  2009   Left cataract  . 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 FOOT SURGERY    . KNEE ARTHROSCOPY    .  TONSILLECTOMY      Allergies: Patient has no known allergies.  Medications: Prior to Admission medications   Medication Sig Start Date End Date Taking? Authorizing Provider  acetaminophen (TYLENOL) 325 MG tablet Take 650 mg by mouth every 6 (six) hours as needed for mild pain or headache.    Yes [provider]  allopurinol (ZYLOPRIM) 300 MG tablet Take 1 tablet by mouth daily. 06/24/12  Yes [provider]  apixaban (ELIQUIS) 5 MG TABS tablet Take 5 mg by mouth 2 (two) times daily.   Yes [provider]  atorvastatin (LIPITOR) 40 MG tablet Take 20 mg by mouth daily. Taking 1/2 tab   Yes [provider]  beta carotene w/minerals (OCUVITE) tablet Take 1 tablet by mouth daily.   Yes [provider]  furosemide (LASIX) 40 MG tablet Take 1 tablet (40 mg total) by mouth daily. 04/12/19 07/11/19 Yes Tobb, Kardie, DO  Homeopathic Products [facility] RELIEF) FOAM Apply 1 application topically as needed (muscle pain).   Yes [provider]  isosorbide mononitrate (IMDUR) 30 MG 24 hr tablet Take 1 tablet (30 mg total) by mouth daily. 04/12/19  Yes Tobb, Kardie, DO  metoprolol succinate (TOPROL XL) 25 MG 24 hr tablet Take 0.5 tablets (12.5 mg total) by mouth daily. 04/12/19  Yes Tobb, Kardie, DO  ondansetron (ZOFRAN) 24 MG tablet Take 24 mg by mouth as needed for nausea.    Yes [provider]  pantoprazole (PROTONIX) 40 MG tablet Take 40 mg by mouth 2 (two) times daily.   Yes [provider]  Potassium Chloride ER 20 MEQ TBCR Take 20 mEq by mouth daily. 04/12/19  Yes Tobb, Kardie, DO  sacubitril-valsartan (ENTRESTO) 24-26 MG Take 1 tablet by mouth 2 (two) times daily.   Yes [provider]  sucralfate (CARAFATE) 1 g tablet Take 1 g by mouth 4 (four) times daily -  with meals and at bedtime.   Yes [provider]     Family History  Problem Relation Age of Onset  . Cancer Mother   . Hypertension Mother   . Leukemia  Mother   . Prostate cancer Father   . Cancer Sister   . Hypertension Sister   . Hypertension Brother   . Hypertension Daughter   . Heart attack Maternal Grandfather     Social History   Socioeconomic History  . Marital status: Married    Spouse name: Not on file  . Number of children: Not on file  . Years of education: Not on file  . Highest education level: Not on file  Occupational History  . Not on file  Social Needs  . Financial resource strain: Not on file  . Food insecurity    Worry: Not on file    Inability: Not on file  . Transportation needs    Medical: No    Non-medical: No  Tobacco Use  . Smoking status: Current Every Day Smoker    Packs/day: 1.50    Years: 51.00    Pack years: 76.50    Types: Cigarettes  . Smokeless tobacco: Never Used  Substance and Sexual Activity  . Alcohol use: No  . Drug use: No  . Sexual activity: Not on file  Lifestyle  . Physical activity    Days per week: Not on file    Minutes per session: Not on file  . Stress: Not on file  Relationships  . Social Herbalist on phone: Not on file    Gets together: Not on file    Attends religious service: Not on file    Active member of club or organization: Not on file    Attends meetings of clubs or organizations: Not on file    Relationship status: Not on file  Other Topics Concern  . Not on file  Social History Narrative  . Not on file      Review of Systems see above; currently denies fever, headache, worsening chest pain, dyspnea, abdominal/back pain, nausea, vomiting.  Does have occasional cough.  Vital Signs: BP 99/63 (BP Location: Right Arm)   Pulse 93   Temp 98 F (36.7 C) (Oral)   Resp 18   Ht 5\' 10"  (1.778 m)   Wt 184 lb (83.5 kg)   SpO2 100%   BMI 26.40 kg/m   Physical Exam awake, alert.  Chest with few bibasilar crackles.  Heart with regular rate and rhythm, ? soft murmur.  Abdomen soft, positive bowel sounds, nontender.  Trace to 1+ edema lower  extremities  Imaging: Dg Shoulder Right  Result Date: 04/13/2019 CLINICAL DATA:  78 year old male with acute onset right shoulder pain while lifting arm, ?Heard a pop?Marland Kitchen EXAM: RIGHT SHOULDER - 2+ VIEW COMPARISON:  Portable chest 11/28/2011. [facility] Portable chest 03/20/2019. FINDINGS: No glenohumeral joint dislocation. Superior subluxation of the humeral head compared to the glenoid. Bone mineralization is within normal limits for age. The right clavicle  and scapula appear intact. Intact proximal right humerus. Intact visible right ribs. IMPRESSION: No acute osseous abnormality identified about the right shoulder. Superior subluxation of the right humeral head suggests rotator cuff pathology. Electronically Signed   By: Genevie Ann M.D.   On: 04/13/2019 14:23   Vas [place] Lower Extremity Venous (dvt)  Result Date: 04/14/2019  Lower Venous Study Indications: History of DVT w/GI Bleed.  Risk Factors: None identified. Anticoagulation: Eliquis. Comparison Study: CTA from [place] noted L DVT Performing Technologist: Oliver Hum RVT  Examination Guidelines: A complete evaluation includes B-mode imaging, spectral Doppler, color Doppler, and power Doppler as needed of all accessible portions of each vessel. Bilateral testing is considered an integral part of a complete examination. Limited examinations for reoccurring indications may be performed as noted.  +-----+---------------+---------+-----------+----------+--------------+ RIGHTCompressibilityPhasicitySpontaneityPropertiesThrombus Aging +-----+---------------+---------+-----------+----------+--------------+ CFV  Full           Yes      Yes                                 +-----+---------------+---------+-----------+----------+--------------+   +---------+---------------+---------+-----------+----------+--------------+ LEFT     CompressibilityPhasicitySpontaneityPropertiesThrombus Aging  +---------+---------------+---------+-----------+----------+--------------+ CFV      Partial        Yes      Yes                  Acute          +---------+---------------+---------+-----------+----------+--------------+ SFJ      Full                                                        +---------+---------------+---------+-----------+----------+--------------+ FV Prox  Full                                                        +---------+---------------+---------+-----------+----------+--------------+ FV Mid   Full                                                        +---------+---------------+---------+-----------+----------+--------------+ FV DistalFull                                                        +---------+---------------+---------+-----------+----------+--------------+ PFV      Full                                                        +---------+---------------+---------+-----------+----------+--------------+ POP      Full           Yes      Yes                                 +---------+---------------+---------+-----------+----------+--------------+  PTV      Full                                                        +---------+---------------+---------+-----------+----------+--------------+ PERO     Partial                                      Acute          +---------+---------------+---------+-----------+----------+--------------+ EIV      Full                                                        +---------+---------------+---------+-----------+----------+--------------+     Summary: Right: No evidence of common femoral vein obstruction. Left: Findings consistent with acute deep vein thrombosis involving the left common femoral vein, and left peroneal veins. No cystic structure found in the popliteal fossa.  *See table(s) above for measurements and observations.    Preliminary     Labs:  CBC: Recent Labs     04/12/19 1153 04/13/19 1203 04/13/19 1818 04/14/19 0305 04/14/19 1153  WBC 11.1* 9.5  --  8.9  9.0  --   HGB 7.1* 7.0* 8.0* 8.4*  8.4* 8.6*  HCT 24.5* 23.6* 26.4* 27.8*  27.5* 28.4*  PLT 449 379  --  337  344  --     COAGS: Recent Labs    04/13/19 1203  INR 1.3*  APTT 32    BMP: Recent Labs    04/12/19 1153 04/13/19 1203 04/14/19 0305  NA 135 135 138  K 5.1 4.1 3.7  CL 100 103 107  CO2 21 20* 19*  GLUCOSE 107* 99 95  BUN 34* 45* 35*  CALCIUM 9.4 8.9 8.9  CREATININE 1.86* 2.21* 1.69*  GFRNONAA 34* 28* 39*  GFRAA 40* 32* 45*    LIVER FUNCTION TESTS: Recent Labs    04/13/19 1203  BILITOT 0.5  AST 8*  ALT 11  ALKPHOS 78  PROT 7.4  ALBUMIN 3.5    TUMOR MARKERS: No results for input(s): AFPTM, CEA, CA199, CHROMGRNA in the last 8760 hours.  Assessment and Plan: 78 y.o. male smoker with history of AAA, arthritis, GERD, gout, hypertension, hyperlipidemia, recent left lower extremity DVT on Eliquis, coronary artery disease, ischemic cardiomyopathy.  Patient presented to [facility] yesterday with melena.  He had prior epinephrine injection and clipping of cratered duodenal ulcer in [place] in September of this year.  He has received transfusion since admission and current hemoglobin is 8.6.  Left lower extremity vascular ultrasound today revealed acute DVT involving left common femoral and left peroneal veins.  He is scheduled for EGD in a.m.  Request now received for IVC filter placement.  Case has been reviewed by Dr. Vernard Gambles. Risks and benefits discussed with the patient /son in law including, but not limited to bleeding, infection, contrast induced renal failure, filter fracture or migration which can lead to emergency surgery or even death, strut penetration with damage or irritation to adjacent structures and caval thrombosis.  All of the patient's questions were answered, patient is agreeable to proceed.  Consent signed and in chart.  Creatinine  currently 1.69; procedure scheduled for this afternoon.    Thank you for this interesting consult.  I greatly enjoyed meeting Richard Townsend and look forward to participating in their care.  A copy of this report was sent to the requesting provider on this date.  Electronically Signed: D. Rowe Robert, PA-C 04/14/2019, 2:42 PM   I spent a total of 25 minutes  in face to face in clinical consultation, greater than 50% of which was counseling/coordinating care for IVC filter placement

## 2019-04-14 NOTE — Consult Note (Signed)
   [facility] Inpatient Consult   04/14/2019  Richard Townsend 27-Dec-1940 [id number]   Patient is currently active with [facility] Care Management for chronic disease management services.  Patient has been engaged by a [place] [employer] fundraiser.  Notified community RN of patient admission. Will continue to follow for progression and disposition plans.  Of note, [facility] Care Management services does not replace or interfere with any services that are needed or arranged by inpatient case management or social work.  Netta Cedars, MSN, [facility] Liaison Nurse Mobile Phone [phone]  Toll free office [phone]

## 2019-04-14 NOTE — Progress Notes (Signed)
RN paged that EKG was done and attending had told her to page this NP with results. EKG reviewed. No acute findings compared to previous EKG. Called RN. EKG was done because pt had complained of chest pain earlier. Not having any chest pain now. No labs ordered by attending. Asked RN to let NP know if recurs.  KJKG, NP [facility]

## 2019-04-14 NOTE — Progress Notes (Signed)
Left lower extremity venous duplex has been completed. Preliminary results can be found in CV Proc through chart review.  Results were given to [facility] PA.  04/14/19 11:53 AM Carlos Levering RVT

## 2019-04-14 NOTE — Progress Notes (Signed)
Repeat EKG complete at 2040 and in chart, on call provider paged and aware. Patient c/o of back pain, hydrocodone given, no c/o of chest pain or any other c/o. Will continue to monitor patient.

## 2019-04-14 NOTE — Procedures (Signed)
  Procedure: IVCgram (CO2) and IVC filter placemnt  retrievable EBL:   minimal Complications:  none immediate  See full dictation in [facility].  Dillard Cannon MD Main # [phone] Pager  [phone]

## 2019-04-14 NOTE — Sedation Documentation (Signed)
IVC filter placement procedure completed.

## 2019-04-15 ENCOUNTER — Inpatient Hospital Stay (HOSPITAL_COMMUNITY): Payer: Medicare HMO

## 2019-04-15 ENCOUNTER — Encounter (HOSPITAL_COMMUNITY): Payer: Self-pay | Admitting: *Deleted

## 2019-04-15 ENCOUNTER — Inpatient Hospital Stay (HOSPITAL_COMMUNITY): Payer: Medicare HMO | Admitting: Certified Registered Nurse Anesthetist

## 2019-04-15 ENCOUNTER — Encounter (HOSPITAL_COMMUNITY)
Admission: EM | Disposition: A | Discharge status: Home Health | Payer: Self-pay | Source: Home / Self Care | Attending: Internal Medicine

## 2019-04-15 DIAGNOSIS — K2990 Gastroduodenitis, unspecified, without bleeding: Secondary | ICD-10-CM

## 2019-04-15 DIAGNOSIS — K2970 Gastritis, unspecified, without bleeding: Secondary | ICD-10-CM

## 2019-04-15 DIAGNOSIS — R9431 Abnormal electrocardiogram [ECG] [EKG]: Secondary | ICD-10-CM

## 2019-04-15 DIAGNOSIS — K3189 Other diseases of stomach and duodenum: Secondary | ICD-10-CM

## 2019-04-15 DIAGNOSIS — R079 Chest pain, unspecified: Secondary | ICD-10-CM

## 2019-04-15 DIAGNOSIS — K922 Gastrointestinal hemorrhage, unspecified: Secondary | ICD-10-CM

## 2019-04-15 DIAGNOSIS — I5022 Chronic systolic (congestive) heart failure: Secondary | ICD-10-CM

## 2019-04-15 DIAGNOSIS — I82402 Acute embolism and thrombosis of unspecified deep veins of left lower extremity: Secondary | ICD-10-CM

## 2019-04-15 DIAGNOSIS — K921 Melena: Secondary | ICD-10-CM

## 2019-04-15 HISTORY — PX: BIOPSY: SHX5522

## 2019-04-15 HISTORY — PX: ESOPHAGOGASTRODUODENOSCOPY (EGD) WITH PROPOFOL: SHX5813

## 2019-04-15 LAB — HEMOGLOBIN AND HEMATOCRIT, BLOOD
HCT: 28.9 % — ABNORMAL LOW (ref 39.0–52.0)
HCT: 28.1 % — ABNORMAL LOW (ref 39.0–52.0)
Hemoglobin: 8.7 g/dL — ABNORMAL LOW (ref 13.0–17.0)
Hemoglobin: 8.3 g/dL — ABNORMAL LOW (ref 13.0–17.0)

## 2019-04-15 LAB — BASIC METABOLIC PANEL
Anion gap: 9 (ref 5–15)
BUN: 32 mg/dL — ABNORMAL HIGH (ref 8–23)
CO2: 22 mmol/L (ref 22–32)
Calcium: 8.7 mg/dL — ABNORMAL LOW (ref 8.9–10.3)
Chloride: 107 mmol/L (ref 98–111)
Creatinine, Ser: 1.77 mg/dL — ABNORMAL HIGH (ref 0.61–1.24)
GFR calc Af Amer: 42 mL/min — ABNORMAL LOW (ref >60)
GFR calc non Af Amer: 36 mL/min — ABNORMAL LOW (ref >60)
Glucose, Bld: 175 mg/dL — ABNORMAL HIGH (ref 70–99)
Potassium: 3.8 mmol/L (ref 3.5–5.1)
Sodium: 138 mmol/L (ref 135–145)

## 2019-04-15 LAB — ECHOCARDIOGRAM COMPLETE
Height: 70 in
Weight: 2944 oz

## 2019-04-15 SURGERY — ESOPHAGOGASTRODUODENOSCOPY (EGD) WITH PROPOFOL
Anesthesia: Monitor Anesthesia Care

## 2019-04-15 MED ORDER — PROPOFOL 500 MG/50ML IV EMUL
INTRAVENOUS | Status: AC
  Filled 2019-04-15: qty 50

## 2019-04-15 MED ORDER — ONDANSETRON HCL 4 MG/2ML IJ SOLN
INTRAMUSCULAR | Status: DC | PRN
  Administered 2019-04-15: 4 mg via INTRAVENOUS

## 2019-04-15 MED ORDER — PANTOPRAZOLE SODIUM 40 MG PO TBEC: 40.0000 mg | DELAYED_RELEASE_TABLET | Freq: Two times a day (BID) | ORAL | Status: DC

## 2019-04-15 MED ORDER — PROPOFOL 10 MG/ML IV BOLUS
INTRAVENOUS | Status: DC | PRN
  Administered 2019-04-15 (×3): 30 mg via INTRAVENOUS

## 2019-04-15 MED ORDER — LACTATED RINGERS IV SOLN
INTRAVENOUS | Status: DC
  Administered 2019-04-15: 07:00:00 1000 mL via INTRAVENOUS

## 2019-04-15 MED ORDER — APIXABAN 5 MG PO TABS
5.0000 mg | ORAL_TABLET | Freq: Two times a day (BID) | ORAL | Status: DC
  Administered 2019-04-15: 15:00:00 5 mg via ORAL
  Filled 2019-04-15: qty 1

## 2019-04-15 MED ORDER — PROPOFOL 500 MG/50ML IV EMUL
INTRAVENOUS | Status: DC | PRN
  Administered 2019-04-15: 125 ug/kg/min via INTRAVENOUS

## 2019-04-15 SURGICAL SUPPLY — 15 items

## 2019-04-15 NOTE — Evaluation (Signed)
Physical Therapy Evaluation Patient Details Name: Richard Townsend MRN: [id number] DOB: February 18, 1941 Today's Date: 04/15/2019   History of Present Illness  Richard Townsend is a 78 y.o. male smoker with history of AAA, arthritis, GERD, gout, hypertension, hyperlipidemia, recent left lower extremity DVT on Eliquis, coronary artery disease, ischemic cardiomyopathy.  Patient presented to [facility] yesterday with melena.  He had prior epinephrine injection and clipping of cratered duodenal ulcer in [place] in September of this year.  He has received transfusion since admission and current hemoglobin is 8.6.  Left lower extremity vascular ultrasound today revealed acute DVT involving left common femoral and left peroneal veins.  He is scheduled for EGD in a.m.  11/4 Pt does have a blood clot and MD has requested a consult for IVC filter    Clinical Impression  Richard Townsend is 78 y.o. male admitted with above HPI and diagnosis. Patient is currently limited by functional impairments below (see PT problem list). Patient lives with his wife who is currently receiving therapy and care at a nursing facility. He reports and modified independence at baseline using a walking stick to navigate his enviroment. He has assistance available from his family and friends; his son-in-law is with him and states they can provide 24/7 assist if needed. Patient is impulsive and agitated about being in the hospital and is eager to return home. He requires supervision for all mobility at this time due to poor safety awareness however when cues provided pt did become less impulsive. Patient will benefit from continued skilled PT interventions to address impairments and progress independence with mobility, recommending HHPT for balance and LE functional strengthening. Acute PT will follow and progress as able.     Follow Up Recommendations Home health PT;Supervision - Intermittent    Equipment Recommendations  None  recommended by PT    Recommendations for Other Services       Precautions / Restrictions Precautions Precautions: Fall Precaution Comments: pt is leagally blind Restrictions Weight Bearing Restrictions: No      Mobility  Bed Mobility Overal bed mobility: Modified Independent                Transfers Overall transfer level: Needs assistance Equipment used: None Transfers: Sit to/from [employer] Transfers Sit to Stand: Supervision Stand pivot transfers: Supervision       General transfer comment: pt requries cues for trasnfers as he remains impulsive, pt does slow down movements and pasue when instructed  Ambulation/Gait Ambulation/Gait assistance: Supervision Gait Distance (Feet): 90 Feet Assistive device: Rolling walker (2 wheeled);None Gait Pattern/deviations: Step-through pattern;Decreased step length - right;Decreased step length - left;Decreased stride length;Narrow base of support     General Gait Details: pt is impulsive with mobility and requries verbal cues for safety, he does stop and pause when safety concerns are discussed. pt with no overt LOB during gait without device however he is unsteady when mvoing quickly, pt balance improved with RW and when instructed to pace himself and walk slower to avoid obstacles in environment; pt able to self grade his fatigue and take standing rest breaks as needed during gait  Stairs            Wheelchair Mobility    Modified Rankin (Stroke Patients Only)       Balance Overall balance assessment: Needs assistance Sitting-balance support: Feet supported;No upper extremity supported Sitting balance-Leahy Scale: Good     Standing balance support: During functional activity;No upper extremity supported;Bilateral upper extremity supported Standing  balance-Leahy Scale: Fair            Pertinent Vitals/Pain Pain Assessment: No/denies pain    Home Living Family/patient expects to be discharged  to:: Private residence Living Arrangements: Spouse/significant other Available Help at Discharge: Family;Available 24 hours/day(pt's son in law is available during the day as well as his neightbors and friends and his daughter often spend teh night with him and his wife) Type of Home: House Home Access: Ramped entrance     Home Layout: One level Home Equipment: Environmental consultant - 2 wheels;Bedside commode;Cane - single point;Shower seat - built in;Hand held shower head Additional Comments: pt has walking stick    Prior Function Level of Independence: Independent with assistive device(s)         Comments: pt uses a walking stick to assess his environment as he is getting around     Hand Dominance   Dominant Hand: Right    Extremity/Trunk Assessment   Upper Extremity Assessment Upper Extremity Assessment: Defer to OT evaluation    Lower Extremity Assessment Lower Extremity Assessment: Generalized weakness    Cervical / Trunk Assessment Cervical / Trunk Assessment: Normal  Communication   Communication: No difficulties  Cognition Arousal/Alertness: Awake/alert Behavior During Therapy: Agitated;WFL for tasks assessed/performed Overall Cognitive Status: Within Functional Limits for tasks assessed           General Comments      Exercises     Assessment/Plan    PT Assessment Patient needs continued PT services  PT Problem List Decreased strength;Decreased activity tolerance;Decreased balance;Decreased knowledge of use of DME       PT Treatment Interventions DME instruction;Functional mobility training;Balance training;Patient/family education;Therapeutic activities;Gait training;Therapeutic exercise    PT Goals (Current goals can be found in the Care Plan section)  Acute Rehab PT Goals Patient Stated Goal: to go home today PT Goal Formulation: With patient Time For Goal Achievement: 04/29/19 Potential to Achieve Goals: Good    Frequency Min 3X/week    AM-PAC PT  "6 Clicks" Mobility  Outcome Measure Help needed turning from your back to your side while in a flat bed without using bedrails?: None Help needed moving from lying on your back to sitting on the side of a flat bed without using bedrails?: None Help needed moving to and from a bed to a chair (including a wheelchair)?: A Little Help needed standing up from a chair using your arms (e.g., wheelchair or bedside chair)?: A Little Help needed to walk in hospital room?: A Little Help needed climbing 3-5 steps with a railing? : A Little 6 Click Score: 20    End of Session Equipment Utilized During Treatment: Gait belt Activity Tolerance: Patient tolerated treatment well Patient left: in bed;with family/visitor present;with call bell/phone within reach Nurse Communication: Mobility status PT Visit Diagnosis: Muscle weakness (generalized) (M62.81);Unsteadiness on feet (R26.81)    Time: [id number] PT Time Calculation (min) (ACUTE ONLY): 24 min   Charges:   PT Evaluation $PT Eval Low Complexity: 1 Low PT Treatments $Gait Training: 8-22 mins      Kipp Brood, PT, DPT Physical Therapist with [facility]  04/15/2019 1:05 PM

## 2019-04-15 NOTE — Anesthesia Preprocedure Evaluation (Signed)
Anesthesia Evaluation  Patient identified by MRN, date of birth, ID band Patient awake    Reviewed: Allergy & Precautions, H&P , NPO status , Patient's Chart, lab work & pertinent test results  Airway Mallampati: II   Neck ROM: full    Dental   Pulmonary shortness of breath, Current Smoker,    breath sounds clear to auscultation       Cardiovascular hypertension, + Peripheral Vascular Disease   Rhythm:regular Rate:Normal     Neuro/Psych  Headaches,    GI/Hepatic PUD, GERD  ,  Endo/Other    Renal/GU Renal InsufficiencyRenal disease     Musculoskeletal  (+) Arthritis ,   Abdominal   Peds  Hematology  (+) Blood dyscrasia, anemia ,   Anesthesia Other Findings   Reproductive/Obstetrics                             Anesthesia Physical Anesthesia Plan  ASA: III  Anesthesia Plan: MAC   Post-op Pain Management:    Induction: Intravenous  PONV Risk Score and Plan: 1 and Propofol infusion, Ondansetron and Treatment may vary due to age or medical condition  Airway Management Planned: Nasal Cannula  Additional Equipment:   Intra-op Plan:   Post-operative Plan:   Informed Consent: I have reviewed the patients History and Physical, chart, labs and discussed the procedure including the risks, benefits and alternatives for the proposed anesthesia with the patient or authorized representative who has indicated his/her understanding and acceptance.       Plan Discussed with: CRNA, Anesthesiologist and Surgeon  Anesthesia Plan Comments:         Anesthesia Quick Evaluation

## 2019-04-15 NOTE — Progress Notes (Signed)
Occupational Therapy Treatment Patient Details Name: Richard Townsend MRN: [id number] DOB: 1941/04/08 Today's Date: 04/15/2019    History of present illness Richard Townsend is a 78 y.o. male smoker with history of AAA, arthritis, GERD, gout, hypertension, hyperlipidemia, recent left lower extremity DVT on Eliquis, coronary artery disease, ischemic cardiomyopathy.  Patient presented to [facility] yesterday with melena.  He had prior epinephrine injection and clipping of cratered duodenal ulcer in [place] in September of this year.  He has received transfusion since admission and current hemoglobin is 8.6.  Left lower extremity vascular ultrasound today revealed acute DVT involving left common femoral and left peroneal veins.  He is scheduled for EGD in a.m.  11/4 Pt does have a blood clot and MD has requested a consult for IVC filter   OT comments  Pt much more agreeable this day.  Pt ready to DC home.  Pt overall S- mod I due to vision.  Educated pt and son on services for the blind. Pt lives in [place]. Son in law will investigate   Follow Up Recommendations  Home health OT;Supervision/Assistance - 24 hour    Equipment Recommendations  None recommended by OT    Recommendations for Other Services      Precautions / Restrictions Precautions Precautions: Fall Precaution Comments: pt is leagally blind Restrictions Weight Bearing Restrictions: No       Mobility Bed Mobility Overal bed mobility: Modified Independent                Transfers Overall transfer level: Modified independent Equipment used: None Transfers: Sit to/from [employer] Sit to Stand: Supervision Stand pivot transfers: Supervision       General transfer comment: Pt does need VC due to decreased vision and being in unfamiliar enviroment    Balance Overall balance assessment: Mild deficits observed, not formally tested Sitting-balance support: Feet supported;No upper  extremity supported Sitting balance-Leahy Scale: Good     Standing balance support: During functional activity;No upper extremity supported;Bilateral upper extremity supported Standing balance-Leahy Scale: Fair                             ADL either performed or assessed with clinical judgement   ADL Overall ADL's : Needs assistance/impaired                                       General ADL Comments: Educated pt and son in law on services for the blind in regards to pts visual deficits.  Pt did verbalize frustration with vision and wanted to see watch or have one that told him the time. Explained services for the blind would be an excellent resource as well as possibly the [place] [facility] for AT.  Pt and son in law verbalized understanding     Vision Baseline Vision/History: Legally blind     Perception     Praxis      Cognition Arousal/Alertness: Awake/alert Behavior During Therapy: WFL for tasks assessed/performed Overall Cognitive Status: Within Functional Limits for tasks assessed                                                     Pertinent Vitals/ Pain  Pain Assessment: No/denies pain  Home Living Family/patient expects to be discharged to:: Private residence Living Arrangements: Spouse/significant other Available Help at Discharge: Family;Available 24 hours/day(pt's son in law is available during the day as well as his neightbors and friends and his daughter often spend teh night with him and his wife) Type of Home: House Home Access: Ramped entrance     Home Layout: One level     Bathroom Shower/Tub: Tub/shower unit;Walk-in shower   Bathroom Toilet: Handicapped height Bathroom Accessibility: Yes   Home Equipment: Walker - 2 wheels;Bedside commode;Cane - single point;Shower seat - built in;Hand held shower head   Additional Comments: pt has walking stick      Prior Functioning/Environment Level of  Independence: Independent with assistive device(s)        Comments: pt uses a walking stick to assess his environment as he is getting around   Frequency  Min 2X/week        Progress Toward Goals  OT Goals(current goals can now be found in the care plan section)  Progress towards OT goals: Progressing toward goals  Acute Rehab OT Goals Patient Stated Goal: to go home today  Plan Discharge plan remains appropriate    Co-evaluation                 AM-PAC OT "6 Clicks" Daily Activity     Outcome Measure   Help from another person eating meals?: None Help from another person taking care of personal grooming?: A Little Help from another person toileting, which includes using toliet, bedpan, or urinal?: A Little Help from another person bathing (including washing, rinsing, drying)?: A Little Help from another person to put on and taking off regular upper body clothing?: A Little Help from another person to put on and taking off regular lower body clothing?: A Little 6 Click Score: 19    End of Session    OT Visit Diagnosis: Muscle weakness (generalized) (M62.81);Low vision, both eyes (H54.2)   Activity Tolerance (and frustration over being in the hospital)   Patient Left in bed;with family/visitor present(on couch with son in law present)   Nurse Communication Mobility status        Time: [id number] OT Time Calculation (min): 19 min  Charges: OT General Charges $OT Visit: 1 Visit OT Treatments $Self Care/Home Management : 8-22 mins  Kari Baars, [place] [phone] Office- [phone]      [place], Edwena Felty D 04/15/2019, 2:27 PM

## 2019-04-15 NOTE — Progress Notes (Signed)
Received verbal consent from Richard Townsend , son in law at [phone] that he and patient verbalizes understanding of the procedure EGD this morning. Witnessed by Elvis Coil, Therapist, sports. Consent in chart, will continue to monitor.

## 2019-04-15 NOTE — Anesthesia Postprocedure Evaluation (Signed)
Anesthesia Post Note  Patient: Richard Townsend  Procedure(s) Performed: ESOPHAGOGASTRODUODENOSCOPY (EGD) WITH PROPOFOL (N/A ) BIOPSY     Patient location during evaluation: Endoscopy Anesthesia Type: MAC Level of consciousness: awake and alert Pain management: pain level controlled Vital Signs Assessment: post-procedure vital signs reviewed and stable Respiratory status: spontaneous breathing, nonlabored ventilation, respiratory function stable and patient connected to nasal cannula oxygen Cardiovascular status: blood pressure returned to baseline and stable Postop Assessment: no apparent nausea or vomiting Anesthetic complications: no    Last Vitals:  Vitals:   04/15/19 0820 04/15/19 0843  BP: 101/60 138/87  Pulse: (!) 102 (!) 118  Resp: (!) 26 (!) 22  Temp:  36.7 C  SpO2: 91% 97%    Last Pain:  Vitals:   04/15/19 0843  TempSrc: Oral  PainSc:                  [place]

## 2019-04-15 NOTE — Progress Notes (Signed)
ANTICOAGULATION CONSULT NOTE - Initial Consult  Pharmacy Consult for apixaban Indication:   No Known Allergies  Patient Measurements: Height: 5\' 10"  (177.8 cm) Weight: 184 lb (83.5 kg) IBW/kg (Calculated) : 73   Vital Signs: Temp: 99.2 F (37.3 C) (11/05 1336) Temp Source: Oral (11/05 1336) BP: 96/58 (11/05 1336) Pulse Rate: 105 (11/05 1336)  Labs: Recent Labs    04/13/19 1203  04/14/19 0305  04/14/19 1805 04/14/19 1959 04/15/19 0316 04/15/19 1124  HGB 7.0*   < > 8.4*  8.4*   < > 8.1*  --  8.7* 8.3*  HCT 23.6*   < > 27.8*  27.5*   < > 26.6*  --  28.9* 28.1*  PLT 379  --  337  344  --   --   --   --   --   APTT 32  --   --   --   --   --   --   --   LABPROT 16.3*  --   --   --   --   --   --   --   INR 1.3*  --   --   --   --   --   --   --   CREATININE 2.21*  --  1.69*  --  1.86*  --   --   --   TROPONINIHS  --   --   --   --  69* 78*  --   --    < > = values in this interval not displayed.    Estimated Creatinine Clearance: 34.9 mL/min (A) (by C-G formula based on SCr of 1.86 mg/dL (H)).   Medical History: Past Medical History:  Diagnosis Date  . AAA (abdominal aortic aneurysm) ([place]) 11/2011  . Abdominal aneurysm without mention of rupture 01/06/2012  . Abdominal tightness 01/11/2013  . Allergic rhinitis   . Arthritis   . DVT (deep venous thrombosis) ([place])   . Dysmetabolic syndrome   . Generalized osteoarthritis   . GERD (gastroesophageal reflux disease)   . Gout   . Headache(784.0)   . Hypercholesterolemia   . Hypertension   . Lower extremity edema   . Nausea alone 07/13/2013  . 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 Peptic ulcer   . Shortness of breath   . UGI bleed     Assessment: 78 y.o. male with a hx of CAD, mild with cath in [place]- no significant blockage - DVT on eliquis, HTN, ICM wit hEF 30%, HLD and recent GI bleed.  Pharmacy consulted to resume eliquis.  Goal of Therapy:  eliquis  per renal function and indication   Plan:  Resume patient's home dose of apixaban 5mg  po twice daily Pharmacy will sign off and follow peripherally  Dolly Rias RPh 04/15/2019, 2:47 PM

## 2019-04-15 NOTE — Progress Notes (Signed)
Patient given discharge instructions, and verbalized an understanding of all discharge instructions.  Patient agrees with discharge plan, and is being discharged in stable medical condition.  Patient given transportation via wheelchair. 

## 2019-04-15 NOTE — Transfer of Care (Signed)
Immediate Anesthesia Transfer of Care Note  Patient: Richard Townsend  Procedure(s) Performed: ESOPHAGOGASTRODUODENOSCOPY (EGD) WITH PROPOFOL (N/A ) BIOPSY  Patient Location: Endoscopy Unit  Anesthesia Type:MAC  Level of Consciousness: awake and patient cooperative  Airway & Oxygen Therapy: Patient Spontanous Breathing and Patient connected to face mask  Post-op Assessment: Report given to RN and Post -op Vital signs reviewed and stable  Post vital signs: Reviewed and stable  Last Vitals:  Vitals Value Taken Time  BP    Temp    Pulse    Resp    SpO2      Last Pain:  Vitals:   04/15/19 0705  TempSrc: Oral  PainSc: 0-No pain      Patients Stated Pain Goal: 2 (15/17/61 6073)  Complications: No apparent anesthesia complications

## 2019-04-15 NOTE — Discharge Summary (Addendum)
Physician Discharge Summary  Richard Townsend [id number] DOB: 03-Jul-1940 DOA: 04/13/2019  PCP: No primary care provider on file.  Admit date: 04/13/2019 Discharge date: 04/15/2019  Admitted From: Home.  Disposition: Home with Home health.   Recommendations for Outpatient Follow-up:  1. Follow up with PCP in 1-2 weeks 2. Please obtain BMP/CBC in one week 3. Please follow up WITH ORTHOPEDICS in one week.  4. Please follow up with gastroenterology as recommended.  5. Please follow up with cardiology as suggested.   Home Health:Yes   Discharge Condition:stable.  CODE STATUS:full code.  Diet recommendation: Heart Healthy   Brief/Interim Summary:  78 year old gentleman prior history of hypertension hyperlipidemia coronary artery disease, ischemic cardiomyopathy, chronic systolic heart failure with left ventricular ejection fraction of 30%, AAA repair in 2013, visual impairment due to macular degeneration, initially presented to to [facility] and subsequently to [facility] for melena and  pneumonia and hospital course was complicated with a DVT without any pulmonary embolism.  He was started on Eliquis for his DVT and discharged home.  Patient was brought in by his son-in-law this admission for black tarry stools.  He was found to be anemic and admitted for further evaluation. Gastroenterology consulted and he underwent EGD showing the below Normal esophagus. - Mild erosive gastropathy with no stigmata of recent bleeding. Biopsied. - Normal examined duodenum. Ulcers documented in September 2020 have healed. - No evidence for active or recent bleeding. No obvious source of bleeding identified on this Examination.  GI suggests to restart the eliquis and plan to follow up the patient in outpatient setting in one week.    Meanwhile cardiology consulted as pt reports some chest pressure and was found to have some non specific st t wave changes on EKG and mildly elevated  troponins.  Echocardiogram revealed  Left ventricular ejection fraction, by visual estimation, is 30 to 35%. The left ventricle has moderately decreased function. There is mildly increased left ventricular hypertrophy.The left ventricle demonstrates global hypokinesis.   Cardiology recommended to hold entresto and they will arrange follow up in one week.    Discharge Diagnoses:  Active Problems:   Symptomatic anemia   GI bleed   Gastritis and gastroduodenitis  Anemia of blood loss secondary to possibly upper GI bleed with his prior history of gastric erosions and duodenal ulcers. S/p 1 unit of PRBC transfusion and hemoglobin greater than 7.  Continue with  PPI twice daily. Gastroenterology consulted and underwent EGD showing  Normal esophagus.  Mild erosive gastropathy with no stigmata of recent bleeding. Biopsied.  Normal examined duodenum. Ulcers documented in September 2020 have healed. No evidence for active or recent bleeding. No obvious source of bleeding identified on this exam.  Restarted the eliqus and follow up with GI in one week.      AKI probably secondary to GI bleed.  Baseline creatinine less than 1 and creatinine on admission 1.86, 2.21. Transfused 1 unit of PRBC on admission and hold Entresto and avoid nephrotoxic agents for now.   Creatinine improved to 1.77 on discharge. Slowly improving.     Right shoulder pain Plain films show superior subluxation of the right humeral head.  Plan for MRI of the shoulder but patient declined further evaluation. We will get orthopedics to look at the films and to see if he needs a sling placement. PT/OT evaluation ordered and recommended HH.    Acute DVT on the right lower extremity IVC filter placed by IR. Meanwhile also continue with eliquis  as no evidence of upper GI blood loss at this time.    History of AAA s/p endovascular repair in 2013. Outpatient follow-up with vascular and continue with  statin.    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 Primary prevention with ICD pending evaluation.  Recommend outpatient follow-up with cardiology Continue with statin and beta-blocker for now.  Delene Loll is on hold for AKI. He appears to be compensated.    Discharge Instructions  Discharge Instructions    Diet - low sodium heart healthy   Complete by: As directed    Discharge instructions   Complete by: As directed    Follow up with PCP in one week. Follow up with orthopedics in one week.     Allergies as of 04/15/2019   No Known Allergies     Medication List    STOP taking these medications   Entresto 24-26 MG Generic drug: sacubitril-valsartan     TAKE these medications   acetaminophen 325 MG tablet Commonly known as: TYLENOL Take 650 mg by mouth every 6 (six) hours as needed for mild pain or headache.   allopurinol 300 MG tablet Commonly known as: ZYLOPRIM Take 1 tablet by mouth daily.   atorvastatin 40 MG tablet Commonly known as: LIPITOR Take 20 mg by mouth daily. Taking 1/2 tab   beta carotene w/minerals tablet Take 1 tablet by mouth daily.   Eliquis 5 MG Tabs tablet Generic drug: apixaban Take 5 mg by mouth 2 (two) times daily.   furosemide 40 MG tablet Commonly known as: LASIX Take 1 tablet (40 mg total) by mouth daily.   isosorbide mononitrate 30 MG 24 hr tablet Commonly known as: IMDUR Take 1 tablet (30 mg total) by mouth daily.   metoprolol succinate 25 MG 24 hr tablet Commonly known as: Toprol XL Take 0.5 tablets (12.5 mg total) by mouth daily.   ondansetron 24 MG tablet Commonly known as: ZOFRAN Take 24 mg by mouth as needed for nausea.   pantoprazole 40 MG tablet Commonly known as: PROTONIX Take 40 mg by mouth 2 (two) times daily.   Potassium Chloride ER 20 MEQ Tbcr Take 20 mEq by mouth daily.    sucralfate 1 g tablet Commonly known as: CARAFATE Take 1 g by mouth 4 (four) times daily -  with meals and at bedtime.   Theraworx Relief Foam Apply 1 application topically as needed (muscle pain).      Follow-up Information    Tobb, Kardie, DO Follow up on 04/29/2019.   Specialty: Cardiology Why: at 3:15 pm  Contact information: [place] [address] [place] Follow up.   Specialty: Home Health Services Why: They will see you next Wednesday-call them if you have not heard from them by Tuesday Contact information: [address] [place]  [address] [phone]          No Known Allergies  Consultations:  cardiology  Gastroenterology     Procedures/Studies: Dg Shoulder Right  Result Date: 04/13/2019 CLINICAL DATA:  78 year old male with acute onset right shoulder pain while lifting arm, ?Heard a pop?Marland Kitchen EXAM: RIGHT SHOULDER - 2+ VIEW COMPARISON:  Portable chest 11/28/2011. [facility] Portable chest 03/20/2019. FINDINGS: No glenohumeral joint dislocation. Superior subluxation of the humeral head compared to the glenoid. Bone mineralization is within normal limits for age. The right clavicle and scapula  appear intact. Intact proximal right humerus. Intact visible right ribs. IMPRESSION: No acute osseous abnormality identified about the right shoulder. Superior subluxation of the right humeral head suggests rotator cuff pathology. Electronically Signed   By: Genevie Ann M.D.   On: 04/13/2019 14:23   Ir Ivc Filter Plmt / S&i /img Guid/mod Sed  Result Date: 04/15/2019 CLINICAL DATA:  DVT. Patient was initially started on coagulation but is having acute GI bleed from a known duodenal lesion, a relative contraindication to continuation of anticoagulation. Caval filtration requested. Renal insufficiency. EXAM: INFERIOR VENACAVOGRAM IVC FILTER PLACEMENT UNDER FLUOROSCOPY FLUOROSCOPY TIME:  0.9 minute; 394 uGym2 DAP  TECHNIQUE: The procedure, risks (including but not limited to bleeding, infection, organ damage ), benefits, and alternatives were explained to the patient. Questions regarding the procedure were encouraged and answered. The patient understands and consents to the procedure. Abdominal venous anatomy reviewed on prior CT. Patency of the right IJ vein was confirmed with ultrasound with image documentation. An appropriate skin site was determined. Skin site was marked, prepped with chlorhexidine, and draped using maximum barrier technique. The region was infiltrated locally with 1% lidocaine. Under real-time ultrasound guidance, the right IJ vein was accessed with a 21 gauge micropuncture needle; the needle tip within the vein was confirmed with ultrasound image documentation. The needle was exchanged over a 018 guidewire for a transitional dilator, which allow advancement of the [facility] wire into the IVC. A long 6 French vascular sheath was placed for inferior venacavography using CO2. This demonstrated no caval thrombus. Renal vein inflows were evident. The [facility] IVC filter was advanced through the sheath and successfully deployed under fluoroscopy at the L2 level. Followup CO2 cavagram demonstrates stable filter position and no evident complication. Tip is in the mid lumen. The sheath was removed and hemostasis achieved at the site. No immediate complication. IMPRESSION: 1. Normal IVC. No thrombus or significant anatomic variation. 2. Technically successful infrarenal IVC filter placement. This is a retrievable model. PLAN: This IVC filter is potentially retrievable. The patient will be assessed for filter retrieval by Interventional Radiology in approximately 8-12 weeks. Further recommendations regarding filter retrieval, continued surveillance or declaration of device permanence, will be made at that time. Electronically Signed   By: Lucrezia Europe M.D.   On: 04/15/2019 07:44   Vas [place] Lower Extremity Venous  (dvt)  Result Date: 04/14/2019  Lower Venous Study Indications: History of DVT w/GI Bleed.  Risk Factors: None identified. Anticoagulation: Eliquis. Comparison Study: CTA from [place] noted L DVT Performing Technologist: Oliver Hum RVT  Examination Guidelines: A complete evaluation includes B-mode imaging, spectral Doppler, color Doppler, and power Doppler as needed of all accessible portions of each vessel. Bilateral testing is considered an integral part of a complete examination. Limited examinations for reoccurring indications may be performed as noted.  +-----+---------------+---------+-----------+----------+--------------+ RIGHTCompressibilityPhasicitySpontaneityPropertiesThrombus Aging +-----+---------------+---------+-----------+----------+--------------+ CFV  Full           Yes      Yes                                 +-----+---------------+---------+-----------+----------+--------------+   +---------+---------------+---------+-----------+----------+--------------+ LEFT     CompressibilityPhasicitySpontaneityPropertiesThrombus Aging +---------+---------------+---------+-----------+----------+--------------+ CFV      Partial        Yes      Yes                  Acute          +---------+---------------+---------+-----------+----------+--------------+ SFJ  Full                                                        +---------+---------------+---------+-----------+----------+--------------+ FV Prox  Full                                                        +---------+---------------+---------+-----------+----------+--------------+ FV Mid   Full                                                        +---------+---------------+---------+-----------+----------+--------------+ FV DistalFull                                                        +---------+---------------+---------+-----------+----------+--------------+ PFV      Full                                                         +---------+---------------+---------+-----------+----------+--------------+ POP      Full           Yes      Yes                                 +---------+---------------+---------+-----------+----------+--------------+ PTV      Full                                                        +---------+---------------+---------+-----------+----------+--------------+ PERO     Partial                                      Acute          +---------+---------------+---------+-----------+----------+--------------+ EIV      Full                                                        +---------+---------------+---------+-----------+----------+--------------+     Summary: Right: No evidence of common femoral vein obstruction. Left: Findings consistent with acute deep vein thrombosis involving the left common femoral vein, and left peroneal veins. No cystic structure found in the popliteal fossa.  *See table(s) above for measurements and observations. Electronically signed by Curt Jews MD on 04/14/2019 at 6:26:30 PM.    Final  Echocardiogram Left ventricular ejection fraction, by visual estimation, is 30 to 35%. The left ventricle has moderately decreased function. There is mildly increased left ventricular hypertrophy.  2. The left ventricle demonstrates global hypokinesis.   Subjective: Wants to go home , no chest pain or sob. No new complaints.   Discharge Exam: Vitals:   04/15/19 1050 04/15/19 1336  BP: 100/62 (!) 96/58  Pulse: (!) 102 (!) 105  Resp: 19 19  Temp: 98.2 F (36.8 C) 99.2 F (37.3 C)  SpO2: 97% 96%   Vitals:   04/15/19 0820 04/15/19 0843 04/15/19 1050 04/15/19 1336  BP: 101/60 138/87 100/62 (!) 96/58  Pulse: (!) 102 (!) 118 (!) 102 (!) 105  Resp: (!) 26 (!) 22 19 19   Temp:  98.1 F (36.7 C) 98.2 F (36.8 C) 99.2 F (37.3 C)  TempSrc:  Oral Oral Oral  SpO2: 91% 97% 97% 96%  Weight:      Height:         General: Pt is alert, awake, not in acute distress Cardiovascular: RRR, S1/S2 +, no rubs, no gallops Respiratory: CTA bilaterally, no wheezing, no rhonchi Abdominal: Soft, NT, ND, bowel sounds + Extremities: no edema, no cyanosis    The results of significant diagnostics from this hospitalization (including imaging, microbiology, ancillary and laboratory) are listed below for reference.     Microbiology: Recent Results (from the past 240 hour(s))  SARS CORONAVIRUS 2 (TAT 6-24 HRS) Nasopharyngeal Nasopharyngeal Swab     Status: None   Collection Time: 04/13/19  1:05 PM   Specimen: Nasopharyngeal Swab  Result Value Ref Range Status   SARS Coronavirus 2 NEGATIVE NEGATIVE Final    Comment: (NOTE) SARS-CoV-2 target nucleic acids are NOT DETECTED. The SARS-CoV-2 RNA is generally detectable in upper and lower respiratory specimens during the acute phase of infection. Negative results do not preclude SARS-CoV-2 infection, do not rule out co-infections with other pathogens, and should not be used as the sole basis for treatment or other patient management decisions. Negative results must be combined with clinical observations, patient history, and epidemiological information. The expected result is Negative. Fact Sheet for Patients: SugarRoll.be Fact Sheet for Healthcare Providers: https://www.woods-mathews.com/ This test is not yet approved or cleared by the [place] FDA and  has been authorized for detection and/or diagnosis of SARS-CoV-2 by FDA under an Emergency Use Authorization (EUA). This EUA will remain  in effect (meaning this test can be used) for the duration of the COVID-19 declaration under Section 56 4(b)(1) of the Act, 21 U.S.C. section 360bbb-3(b)(1), unless the authorization is terminated or revoked sooner. Performed at [facility], [place] [address] [place], [place] [address]      Labs: BNP (last 3  results) No results for input(s): BNP in the last 8760 hours. Basic Metabolic Panel: Recent Labs  Lab 04/12/19 1153 04/13/19 1203 04/14/19 0305 04/14/19 1805 04/15/19 1417  NA 135 135 138 138 138  K 5.1 4.1 3.7 3.7 3.8  CL 100 103 107 107 107  CO2 21 20* 19* 21* 22  GLUCOSE 107* 99 95 123* 175*  BUN 34* 45* 35* 35* 32*  CREATININE 1.86* 2.21* 1.69* 1.86* 1.77*  CALCIUM 9.4 8.9 8.9 8.6* 8.7*  MG 2.2  --   --  1.9  --    Liver Function Tests: Recent Labs  Lab 04/13/19 1203  AST 8*  ALT 11  ALKPHOS 78  BILITOT 0.5  PROT 7.4  ALBUMIN 3.5   No results for input(s):  LIPASE, AMYLASE in the last 168 hours. No results for input(s): AMMONIA in the last 168 hours. CBC: Recent Labs  Lab 04/12/19 1153 04/13/19 1203  04/14/19 0305 04/14/19 1153 04/14/19 1805 04/15/19 0316 04/15/19 1124  WBC 11.1* 9.5  --  8.9  9.0  --   --   --   --   HGB 7.1* 7.0*   < > 8.4*  8.4* 8.6* 8.1* 8.7* 8.3*  HCT 24.5* 23.6*   < > 27.8*  27.5* 28.4* 26.6* 28.9* 28.1*  MCV 85 84.3  --  85.3  85.4  --   --   --   --   PLT 449 379  --  337  344  --   --   --   --    < > = values in this interval not displayed.   Cardiac Enzymes: No results for input(s): CKTOTAL, CKMB, CKMBINDEX, TROPONINI in the last 168 hours. BNP: Invalid input(s): POCBNP CBG: No results for input(s): GLUCAP in the last 168 hours. D-Dimer No results for input(s): DDIMER in the last 72 hours. Hgb A1c No results for input(s): HGBA1C in the last 72 hours. Lipid Profile No results for input(s): CHOL, HDL, LDLCALC, TRIG, CHOLHDL, LDLDIRECT in the last 72 hours. Thyroid function studies No results for input(s): TSH, T4TOTAL, T3FREE, THYROIDAB in the last 72 hours.  Invalid input(s): FREET3 Anemia work up No results for input(s): VITAMINB12, FOLATE, FERRITIN, TIBC, IRON, RETICCTPCT in the last 72 hours. Urinalysis    Component Value Date/Time   COLORURINE YELLOW 11/27/2011 0900   APPEARANCEUR CLEAR 11/27/2011 0900    LABSPEC >1.046 (H) 11/27/2011 0900   PHURINE 5.5 11/27/2011 0900   GLUCOSEU NEGATIVE 11/27/2011 0900   HGBUR NEGATIVE 11/27/2011 0900   BILIRUBINUR NEGATIVE 11/27/2011 0900   KETONESUR NEGATIVE 11/27/2011 0900   PROTEINUR NEGATIVE 11/27/2011 0900   UROBILINOGEN 0.2 11/27/2011 0900   NITRITE NEGATIVE 11/27/2011 0900   LEUKOCYTESUR NEGATIVE 11/27/2011 0900   Sepsis Labs Invalid input(s): PROCALCITONIN,  WBC,  LACTICIDVEN Microbiology Recent Results (from the past 240 hour(s))  SARS CORONAVIRUS 2 (TAT 6-24 HRS) Nasopharyngeal Nasopharyngeal Swab     Status: None   Collection Time: 04/13/19  1:05 PM   Specimen: Nasopharyngeal Swab  Result Value Ref Range Status   SARS Coronavirus 2 NEGATIVE NEGATIVE Final    Comment: (NOTE) SARS-CoV-2 target nucleic acids are NOT DETECTED. The SARS-CoV-2 RNA is generally detectable in upper and lower respiratory specimens during the acute phase of infection. Negative results do not preclude SARS-CoV-2 infection, do not rule out co-infections with other pathogens, and should not be used as the sole basis for treatment or other patient management decisions. Negative results must be combined with clinical observations, patient history, and epidemiological information. The expected result is Negative. Fact Sheet for Patients: SugarRoll.be Fact Sheet for Healthcare Providers: https://www.woods-mathews.com/ This test is not yet approved or cleared by the [place] FDA and  has been authorized for detection and/or diagnosis of SARS-CoV-2 by FDA under an Emergency Use Authorization (EUA). This EUA will remain  in effect (meaning this test can be used) for the duration of the COVID-19 declaration under Section 56 4(b)(1) of the Act, 21 U.S.C. section 360bbb-3(b)(1), unless the authorization is terminated or revoked sooner. Performed at [facility], [place] [address] [place], [place] [address]       Time coordinating discharge: 36 minutes  SIGNED:   Hosie Poisson, MD  [facility] 04/15/2019, 5:05 PM Pager   If 7PM-7AM,  please contact night-coverage [email or web address] Password [email or web address]

## 2019-04-15 NOTE — Op Note (Addendum)
[facility] Patient Name: Richard Townsend Procedure Date: 04/15/2019 MRN: [id number] Attending MD: Thornton Park MD, MD Date of Birth: August 08, 1940 CSN: [id number] Age: 78 Admit Type: Outpatient Procedure:                Upper GI endoscopy Indications:              Melena x 1. Anemia. On Eliquis.                           Acute GI bleed 9/20 in [place]. EGD 02/28/19                            showed multiple gastric erosions, 3 nonbleeding                            cratered duodenal ulcers with the most distal ulcer                            having a visible vessel treated with epi and clips.                            The other duodenal ulcers were clean based. Providers:                Thornton Park MD, MD, Carmie End, RN,                            Lazaro Arms, Technician Referring MD:              Medicines:                Monitored Anesthesia Care Complications:            No immediate complications. Estimated blood loss:                            Minimal. Estimated Blood Loss:     Estimated blood loss was minimal. Procedure:                Pre-Anesthesia Assessment:                           - Prior to the procedure, a History and Physical                            was performed, and patient medications and                            allergies were reviewed. The patient's tolerance of                            previous anesthesia was also reviewed. The risks                            and benefits of the procedure and the sedation  options and risks were discussed with the patient.                            All questions were answered, and informed consent                            was obtained. Prior Anticoagulants: The patient has                            taken Eliquis (apixaban), last dose was 2 days                            prior to procedure. ASA Grade Assessment: III - A                            patient with  severe systemic disease. After                            reviewing the risks and benefits, the patient was                            deemed in satisfactory condition to undergo the                            procedure.                           After obtaining informed consent, the endoscope was                            passed under direct vision. Throughout the                            procedure, the patient's blood pressure, pulse, and                            oxygen saturations were monitored continuously. The                            [id number]-[id number]) Olympus gastroscope was                            introduced through the mouth, and advanced to the                            hub of the gastroscope. The upper GI endoscopy was                            accomplished with ease. The patient tolerated the                            procedure well. Scope In: Scope Out: Findings:      The esophagus was normal. No ring, web, stricture, or esophagitis.  A few localized small erosions with no stigmata of recent bleeding were       found in the gastric antrum. Biopsies were taken from the antrum, body,       and fundus with a cold forceps for histology. Estimated blood loss was       minimal. No blood or hematin present in the stomach.      The examined duodenum was normal. No ulceration or scarring seen. The       clip placed in September is no longer present. No blood or hematin       present in the duodenum. Impression:               - Normal esophagus.                           - Mild erosive gastropathy with no stigmata of                            recent bleeding. Biopsied.                           - Normal examined duodenum. Ulcers documented in                            September 2020 have healed.                           - No evidence for active or recent bleeding. No                            obvious source of bleeding identified on this                             examination. Moderate Sedation:      Not Applicable - Patient had care per Anesthesia. Recommendation:           - Return patient to hospital ward for ongoing care.                           - Advance diet as tolerated.                           - Continue present medications.                           - Continue daily PPI.                           - Avoid NSAIDs as able.                           - Await pathology results.                           - Would pursue further evaluation with colonoscopy                            +/- capsule  endoscopy with additional overt or                            occult GI blood loss or progressive unexplained                            anemia.                           - May resume Eliquis if clinically indicated. Procedure Code(s):        --- Professional ---                           [phone], Esophagogastroduodenoscopy, flexible,                            transoral; with biopsy, single or multiple Diagnosis Code(s):        --- Professional ---                           K31.89, Other diseases of stomach and duodenum                           K92.1, Melena (includes Hematochezia) CPT copyright 2019 American Medical Association. All rights reserved. The codes documented in this report are preliminary and upon coder review may  be revised to meet current compliance requirements. Thornton Park MD, MD 04/15/2019 8:13:40 AM This report has been signed electronically. Number of Addenda: 0

## 2019-04-15 NOTE — Consult Note (Signed)
Cardiology Consultation:   Patient ID: Richard Townsend MRN: [id number]; DOB: June 26, 1940  Admit date: 04/13/2019 Date of Consult: 04/15/2019  Primary Care Provider: No primary care provider on file. Primary Cardiologist: [name], DO  Primary Electrophysiologist:  None    Patient Profile:   Richard Townsend is a 78 y.o. male with a hx of CAD, mild with cath in [place]- no significant blockage - DVT on eliquis, HTN, ICM wit hEF 30%, HLD and recent GI bleed, AAA with endovascular repair 2013, who is being seen today for the evaluation of chest pain at the request of Dr Karleen Hampshire .  History of Present Illness:   Mr. Thall with above hx and recent eval with Dr. Harriet Masson with our group with non obstructive CAD on imdur, lipitor and decreased BB toprol XL to 12.5 daily and decreased lasix to 40 mg daily.  Due to low BP and lightheadedness.  EF reported at 30% on BB and entresto.  Plan for echo. She was concerned about eliquis with recent GI bleed (multiple gastric erosions and3 nonbleeding cratered duodenal ulcers with distal most ulcer having a visible vessel treated with epi and clips, also 2 clean based ulcers duodenal bulb).  Hgb was 7.1 and pt had been admitted to [facility]    Pt in addition to drop in Hgb he had black tarry stool so was admitted. 04/13/19.   On admit with AKI as well with Cr 2.21 usually 0.8.  entresto held.  He does continue with smoking 2-3 cigarettes per day.      EGD today with healed ulcers, recommended colonoscopy. Hgb improved after 1 unit PRBCs.        EKG:  The EKG was personally reviewed and demonstrates:  Numerous reviewed from 04/12/19 on, SR with incomplete LBBB and deep Twave inversions inf. laterally and decreased R wave in V 3.  From 26.2.20 Telemetry:  Telemetry was personally reviewed and demonstrates:  SR  IVC filter has been placed  BP 96/38 to 100/62 P 94 to 118 afebrile  He denies chest pain. Though he clarifies that he felt pain and choking because of mucus in  his throat.  He felt if he could sit up it would resolve.  No pain now and is walking in the hall without complications with PT, uses a cane or walker at home.   He has chronic dyspnea with activity.   Echo has just been done.     Heart Pathway Score:     Past Medical History:  Diagnosis Date   AAA (abdominal aortic aneurysm) ([place]) 11/2011   Abdominal aneurysm without mention of rupture 01/06/2012   Abdominal tightness 01/11/2013   Allergic rhinitis    Arthritis    DVT (deep venous thrombosis) ([facility])    Dysmetabolic syndrome    Generalized osteoarthritis    GERD (gastroesophageal reflux disease)    Gout    Headache(784.0)    Hypercholesterolemia    Hypertension    Lower extremity edema    Nausea alone 07/13/2013   Occlusion and stenosis of carotid artery without mention of cerebral infarction 01/11/2013   Peptic ulcer    Shortness of breath    UGI bleed     Past Surgical History:  Procedure Laterality Date   ABDOMINAL AORTIC ANEURYSM REPAIR  11/28/11   stent    ABDOMINAL AORTIC ANEURYSM REPAIR     APPENDECTOMY     cataracts     EYE SURGERY  2009   Left cataract   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 Authorizing Provider  acetaminophen (TYLENOL) 325 MG tablet Take 650 mg by mouth every 6 (six) hours as needed for mild pain or headache.    Yes [provider]  allopurinol (ZYLOPRIM) 300 MG tablet Take 1 tablet by mouth daily. 06/24/12  Yes [provider]  apixaban (ELIQUIS) 5 MG TABS tablet Take 5 mg by mouth 2 (two) times daily.   Yes [provider]  atorvastatin (LIPITOR) 40 MG tablet Take 20 mg by mouth daily. Taking 1/2 tab   Yes [provider]  beta carotene w/minerals (OCUVITE) tablet Take 1  tablet by mouth daily.   Yes [provider]  furosemide (LASIX) 40 MG tablet Take 1 tablet (40 mg total) by mouth daily. 04/12/19 07/11/19 Yes Tobb, Kardie, DO  Homeopathic Products [facility] RELIEF) FOAM Apply 1 application topically as needed (muscle pain).   Yes [provider]  isosorbide mononitrate (IMDUR) 30 MG 24 hr tablet Take 1 tablet (30 mg total) by mouth daily. 04/12/19  Yes Tobb, Kardie, DO  metoprolol succinate (TOPROL XL) 25 MG 24 hr tablet Take 0.5 tablets (12.5 mg total) by mouth daily. 04/12/19  Yes Tobb, Kardie, DO  ondansetron (ZOFRAN) 24 MG tablet Take 24 mg by mouth as needed for nausea.    Yes [provider]  pantoprazole (PROTONIX) 40 MG tablet Take 40 mg by mouth 2 (two) times daily.   Yes [provider]  Potassium Chloride ER 20 MEQ TBCR Take 20 mEq by mouth daily. 04/12/19  Yes Tobb, Kardie, DO  sacubitril-valsartan (ENTRESTO) 24-26 MG Take 1 tablet by mouth 2 (two) times daily.   Yes [provider]  sucralfate (CARAFATE) 1 g tablet Take 1 g by mouth 4 (four) times daily -  with meals and at bedtime.   Yes [provider]    Inpatient Medications: Scheduled Meds:  atorvastatin  20 mg Oral Daily   furosemide  40 mg Oral Daily   metoprolol succinate  12.5 mg Oral Daily   multivitamin  1 tablet Oral Daily   nicotine  21 mg Transdermal Daily   sodium chloride flush  3 mL Intravenous Q12H   sucralfate  1 g Oral TID WC & HS   Continuous Infusions:  sodium chloride     pantoprozole (PROTONIX) infusion 8 mg/hr (04/15/19 0150)   PRN Meds: acetaminophen, HYDROcodone-acetaminophen, morphine injection, ondansetron, traMADol  Allergies:   No Known Allergies  Social History:   Social History   Socioeconomic History   Marital status: Married    Spouse name: Not on file   Number of children: Not on file   Years of education: Not on file   Highest education level: Not on file  Occupational History     Not on file  Social Needs   Financial resource strain: Not on file   Food insecurity    Worry: Not on file    Inability: Not on file   Transportation needs    Medical: No    Non-medical: No  Tobacco Use   Smoking status: Current Every Day Smoker    Packs/day: 0.25    Years: 51.00    Pack years: 12.75  Types: Cigarettes   Smokeless tobacco: Never Used  Substance and Sexual Activity   Alcohol use: No   Drug use: No   Sexual activity: Not on file  Lifestyle   Physical activity    Days per week: Not on file    Minutes per session: Not on file   Stress: Not on file  Relationships   Social connections    Talks on phone: Not on file    Gets together: Not on file    Attends religious service: Not on file    Active member of club or organization: Not on file    Attends meetings of clubs or organizations: Not on file    Relationship status: Not on file   Intimate partner violence    Fear of current or ex partner: Not on file    Emotionally abused: Not on file    Physically abused: Not on file    Forced sexual activity: Not on file  Other Topics Concern   Not on file  Social History Narrative   Not on file    Family History:    Family History  Problem Relation Age of Onset   Cancer Mother    Hypertension Mother    Leukemia Mother    Prostate cancer Father    Cancer Sister    Hypertension Sister    Hypertension Brother    Hypertension Daughter    Heart attack Maternal Grandfather      ROS:  Please see the history of present illness.  General:no colds or fevers, no weight changes Skin:no rashes or ulcers HEENT:no blurred vision, no congestion CV:see HPI PUL:see HPI GI:no diarrhea constipation + melena, no indigestion GU:no hematuria, no dysuria MS:no joint pain, no claudication Neuro:no syncope, no lightheadedness Endo:no diabetes, no thyroid disease  All other ROS reviewed and negative.     Physical Exam/Data:   Vitals:    04/15/19 0810 04/15/19 0820 04/15/19 0843 04/15/19 1050  BP: 105/70 101/60 138/87 100/62  Pulse: 100 (!) 102 (!) 118 (!) 102  Resp: 18 (!) 26 (!) 22 19  Temp:   98.1 F (36.7 C) 98.2 F (36.8 C)  TempSrc:   Oral Oral  SpO2: 98% 91% 97% 97%  Weight:      Height:        Intake/Output Summary (Last 24 hours) at 04/15/2019 1108 Last data filed at 04/15/2019 0751 Gross per 24 hour  Intake 660 ml  Output 0 ml  Net 660 ml   Last 3 Weights 04/13/2019 04/13/2019 04/12/2019  Weight (lbs) 184 lb 184 lb 6.4 oz 183 lb  Weight (kg) 83.462 kg 83.643 kg 83.008 kg     Body mass index is 26.4 kg/m.  General:  Well nourished, well developed, in no acute distress, hungry wants to go home HEENT: normal Lymph: no adenopathy Neck: no JVD Endocrine:  No thryomegaly Vascular: No carotid bruits; pedal pulses 1+ bilaterally   Cardiac:  normal S1, S2; RRR; 2/6 systolic murmur no gallup rub or click  Lungs:  Clear diminished to auscultation bilaterally, no wheezing, + rhonchi no rales  Abd: soft, nontender, no hepatomegaly  Ext: tr to 1+ edema just at ankles, this normal for him Musculoskeletal:  No deformities, BUE and BLE strength normal and equal Skin: warm and dry  Neuro:  Alert and oriented X 3 MAE, follows commands, no focal abnormalities noted Psych:  Normal affect   Relevant CV Studies: Echo pending  Laboratory Data:  High Sensitivity Troponin:   Recent  Labs  Lab 04/14/19 1805 04/14/19 1959  TROPONINIHS 69* 78*     Chemistry Recent Labs  Lab 04/13/19 1203 04/14/19 0305 04/14/19 1805  NA 135 138 138  K 4.1 3.7 3.7  CL 103 107 107  CO2 20* 19* 21*  GLUCOSE 99 95 123*  BUN 45* 35* 35*  CREATININE 2.21* 1.69* 1.86*  CALCIUM 8.9 8.9 8.6*  GFRNONAA 28* 39* 34*  GFRAA 32* 45* 40*  ANIONGAP 12 12 10     Recent Labs  Lab 04/13/19 1203  PROT 7.4  ALBUMIN 3.5  AST 8*  ALT 11  ALKPHOS 78  BILITOT 0.5   Hematology Recent Labs  Lab 04/12/19 1153 04/13/19 1203   04/14/19 0305 04/14/19 1153 04/14/19 1805 04/15/19 0316  WBC 11.1* 9.5  --  8.9   9.0  --   --   --   RBC 2.90* 2.80*  --  3.26*   3.22*  --   --   --   HGB 7.1* 7.0*   < > 8.4*   8.4* 8.6* 8.1* 8.7*  HCT 24.5* 23.6*   < > 27.8*   27.5* 28.4* 26.6* 28.9*  MCV 85 84.3  --  85.3   85.4  --   --   --   MCH 24.5* 25.0*  --  25.8*   26.1  --   --   --   MCHC 29.0* 29.7*  --  30.2   30.5  --   --   --   RDW 16.8* 17.3*  --  16.8*   16.8*  --   --   --   PLT 449 379  --  337   344  --   --   --    < > = values in this interval not displayed.   BNPNo results for input(s): BNP, PROBNP in the last 168 hours.  DDimer No results for input(s): DDIMER in the last 168 hours.   Radiology/Studies:  Dg Shoulder Right  Result Date: 04/13/2019 CLINICAL DATA:  78 year old male with acute onset right shoulder pain while lifting arm, ?Heard a pop?Marland Kitchen EXAM: RIGHT SHOULDER - 2+ VIEW COMPARISON:  Portable chest 11/28/2011. [facility] Portable chest 03/20/2019. FINDINGS: No glenohumeral joint dislocation. Superior subluxation of the humeral head compared to the glenoid. Bone mineralization is within normal limits for age. The right clavicle and scapula appear intact. Intact proximal right humerus. Intact visible right ribs. IMPRESSION: No acute osseous abnormality identified about the right shoulder. Superior subluxation of the right humeral head suggests rotator cuff pathology. Electronically Signed   By: Genevie Ann M.D.   On: 04/13/2019 14:23   Ir Ivc Filter Plmt / S&i /img Guid/mod Sed  Result Date: 04/15/2019 CLINICAL DATA:  DVT. Patient was initially started on coagulation but is having acute GI bleed from a known duodenal lesion, a relative contraindication to continuation of anticoagulation. Caval filtration requested. Renal insufficiency. EXAM: INFERIOR VENACAVOGRAM IVC FILTER PLACEMENT UNDER FLUOROSCOPY FLUOROSCOPY TIME:  0.9 minute; 394 uGym2 DAP TECHNIQUE: The procedure, risks (including but not limited  to bleeding, infection, organ damage ), benefits, and alternatives were explained to the patient. Questions regarding the procedure were encouraged and answered. The patient understands and consents to the procedure. Abdominal venous anatomy reviewed on prior CT. Patency of the right IJ vein was confirmed with ultrasound with image documentation. An appropriate skin site was determined. Skin site was marked, prepped with chlorhexidine, and draped using maximum barrier technique. The region was infiltrated locally with  1% lidocaine. Under real-time ultrasound guidance, the right IJ vein was accessed with a 21 gauge micropuncture needle; the needle tip within the vein was confirmed with ultrasound image documentation. The needle was exchanged over a 018 guidewire for a transitional dilator, which allow advancement of the [facility] wire into the IVC. A long 6 French vascular sheath was placed for inferior venacavography using CO2. This demonstrated no caval thrombus. Renal vein inflows were evident. The [facility] IVC filter was advanced through the sheath and successfully deployed under fluoroscopy at the L2 level. Followup CO2 cavagram demonstrates stable filter position and no evident complication. Tip is in the mid lumen. The sheath was removed and hemostasis achieved at the site. No immediate complication. IMPRESSION: 1. Normal IVC. No thrombus or significant anatomic variation. 2. Technically successful infrarenal IVC filter placement. This is a retrievable model. PLAN: This IVC filter is potentially retrievable. The patient will be assessed for filter retrieval by Interventional Radiology in approximately 8-12 weeks. Further recommendations regarding filter retrieval, continued surveillance or declaration of device permanence, will be made at that time. Electronically Signed   By: Lucrezia Europe M.D.   On: 04/15/2019 07:44   Vas [place] Lower Extremity Venous (dvt)  Result Date: 04/14/2019  Lower Venous Study Indications:  History of DVT w/GI Bleed.  Risk Factors: None identified. Anticoagulation: Eliquis. Comparison Study: CTA from [place] noted L DVT Performing Technologist: Oliver Hum RVT  Examination Guidelines: A complete evaluation includes B-mode imaging, spectral Doppler, color Doppler, and power Doppler as needed of all accessible portions of each vessel. Bilateral testing is considered an integral part of a complete examination. Limited examinations for reoccurring indications may be performed as noted.  +-----+---------------+---------+-----------+----------+--------------+  RIGHT Compressibility Phasicity Spontaneity Properties Thrombus Aging  +-----+---------------+---------+-----------+----------+--------------+  CFV   Full            Yes       Yes                                    +-----+---------------+---------+-----------+----------+--------------+   +---------+---------------+---------+-----------+----------+--------------+  LEFT      Compressibility Phasicity Spontaneity Properties Thrombus Aging  +---------+---------------+---------+-----------+----------+--------------+  CFV       Partial         Yes       Yes                    Acute           +---------+---------------+---------+-----------+----------+--------------+  SFJ       Full                                                             +---------+---------------+---------+-----------+----------+--------------+  FV Prox   Full                                                             +---------+---------------+---------+-----------+----------+--------------+  FV Mid    Full                                                             +---------+---------------+---------+-----------+----------+--------------+  FV Distal Full                                                             +---------+---------------+---------+-----------+----------+--------------+  PFV       Full                                                              +---------+---------------+---------+-----------+----------+--------------+  POP       Full            Yes       Yes                                    +---------+---------------+---------+-----------+----------+--------------+  PTV       Full                                                             +---------+---------------+---------+-----------+----------+--------------+  PERO      Partial                                          Acute           +---------+---------------+---------+-----------+----------+--------------+  EIV       Full                                                             +---------+---------------+---------+-----------+----------+--------------+     Summary: Right: No evidence of common femoral vein obstruction. Left: Findings consistent with acute deep vein thrombosis involving the left common femoral vein, and left peroneal veins. No cystic structure found in the popliteal fossa.  *See table(s) above for measurements and observations. Electronically signed by Curt Jews MD on 04/14/2019 at 6:26:30 PM.    Final     Assessment and Plan:   1. Acute GI bleed/ANemia  with DVT on Eliquis - transfused 1 unit PRBCs and Hg 8.4, IVC filter was placed.  2. NICM per records, EF reported to be 30% - echo pending -- has been on entresto and BB, along with imdur, recently decreased due to hypotension.  And entresto is on hold due to AKI.  Continue BB, lasix at 40 -his imdur has been held and with decreased BP may need to hold. Dr. Audie Box to see.  3. AKI with pk cr of 2.21 now 1.86, entresto on hold  4. Chest pain that per pt is atypical for CAD.  He has nonobstructive CAD with recent cath in [place]- I do not have records.  Troponin 69>>78 , maybe demand ischemia with acute  blood loss anemia and his EF of 30%.  5. Hx of AAA with endovascular repair in 2013. Stable. 6. HLD on lipitor , continue.    I made a follow up appt with Dr. Harriet Masson 04/29/19 and cancelled the echo in dec.    For  questions or updates, please contact [place] Please consult [email or web address] for contact info under     Signed, Cecilie Kicks, NP  04/15/2019 11:08 AM

## 2019-04-15 NOTE — Progress Notes (Signed)
  Echocardiogram 2D Echocardiogram has been performed.  Richard Townsend 04/15/2019, 10:53 AM

## 2019-04-15 NOTE — TOC Initial Note (Signed)
Transition of Care [facility]) - Initial/Assessment Note    Patient Details  Name: Richard Townsend MRN: [id number] Date of Birth: 02/26/1941  Transition of Care [facility]) CM/SW Contact:    Trish Mage, LCSW Phone Number: 04/15/2019, 2:34 PM  Clinical Narrative:   Richard Townsend was seen in follow up to PT recommendation of HHPT services.  Resident of [place] with wife, who is currently in rehab in SNF in [place], has good supports of daughter and son-in-law, latter of which is in room with patient sitting in recliner with patient on hard chair.  States he prefers that one.  Also makes sure I know he is active with taking dog out into yard several times a day and fixing meals and cleaning up in the home.  He has a cane, there is a BSC in the home and shower benches.  They used [facility] for his wife in the past, and he wants to use same.  Made referral-which was accepted.  Will need order for [facility] PT day of d/c.  Will need orders, along with D/C summary, demographic sheet, and PT notes FAXed to [phone] the day of D/C.  TOC will continue to follow during the course of hospitalization.                 Expected Discharge Plan: [place] Barriers to Discharge: No Barriers Identified   Patient Goals and CMS Choice Patient states their goals for this hospitalization and ongoing recovery are:: "I'll do whatever you recommend.  I want to get out of here." CMS Medicare.gov Compare Post Acute Care list provided to:: Patient Choice offered to / list presented to : Patient  Expected Discharge Plan and Services Expected Discharge Plan: [place]   Discharge Planning Services: CM Consult Post Acute Care Choice: [place] arrangements for the past 2 months: [place] Agency: [place]) Date [place]: 04/15/19 Time HH Agency Contacted: [id number]     Prior Living Arrangements/Services Living arrangements for the past 2 months: [place] with:: Spouse Patient language and need for interpreter reviewed:: Yes Do you feel safe going back to the place where you live?: Yes      Need for Family Participation in Patient Care: Yes (Comment) Care giver support system in place?: Yes (comment) Current home services: DME Criminal Activity/Legal Involvement Pertinent to Current Situation/Hospitalization: No - Comment as needed  Activities of Daily Living Home Assistive Devices/Equipment: Cane (specify quad or straight) ADL Screening (condition at time of admission) Patient's cognitive ability adequate to safely complete daily activities?: Yes Is the patient deaf or have difficulty hearing?: No Does the patient have difficulty seeing, even when wearing glasses/contacts?: Yes Does the patient have difficulty concentrating, remembering, or making decisions?: No Patient able to express need for assistance with ADLs?: Yes Does the patient have difficulty dressing or bathing?: No Independently performs ADLs?: Yes (appropriate for developmental age) Communication: Independent Dressing (OT): Independent Is this a change from baseline?: Pre-admission baseline Grooming: Independent Is this a change from baseline?: Pre-admission baseline Feeding: Independent Bathing: Independent Is this a change from baseline?: Pre-admission baseline Toileting: Independent In/Out Bed: Independent Walks in Home: Independent Does the patient have difficulty walking or climbing stairs?: No Weakness of Legs:  None Weakness of Arms/Hands: None  Permission Sought/Granted Permission sought to share information with : Family Supports Permission granted to share information with : Yes, Verbal Permission Granted  Share Information with NAME: [name] Long     Permission granted to share info w Relationship: son-in-law     Emotional  Assessment Appearance:: Appears stated age Attitude/Demeanor/Rapport: Engaged Affect (typically observed): Appropriate Orientation: : Oriented to Self, Oriented to Place, Oriented to  Time, Oriented to Situation Alcohol / Substance Use: Not Applicable Psych Involvement: No (comment)  Admission diagnosis:  Pain [R52] Gastrointestinal hemorrhage with melena [K92.1] Symptomatic anemia [D64.9] Patient Active Problem List   Diagnosis Date Noted  . Gastritis and gastroduodenitis   . Melena 04/13/2019  . GIB (gastrointestinal bleeding) 04/13/2019  . Symptomatic anemia 04/13/2019  . DVT (deep venous thrombosis) ([place]) 04/13/2019  . GI bleed 04/13/2019  . Nausea alone 07/13/2013  . AAA (abdominal aortic aneurysm) ([place]) 01/11/2013  . 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 Abdominal tightness 01/11/2013  . Abdominal aneurysm without mention of rupture 01/06/2012   PCP:  No primary care provider on file. Pharmacy:   [facility] [place], [place] - [address] AT [place] [address] [place] [id number] Phone: [phone] Fax: [phone]     Social Determinants of Health (SDOH) Interventions    Readmission Risk Interventions No flowsheet data found.

## 2019-04-15 NOTE — Interval H&P Note (Signed)
History and Physical Interval Note:  04/15/2019 7:30 AM  Richard Townsend  has presented today for surgery, with the diagnosis of GI bleed, ulcers.  The various methods of treatment have been discussed with the patient and family. After consideration of risks, benefits and other options for treatment, the patient has consented to  Procedure(s): ESOPHAGOGASTRODUODENOSCOPY (EGD) WITH PROPOFOL (N/A) as a surgical intervention.  The patient's history has been reviewed, patient examined, no change in status, stable for surgery.  I have reviewed the patient's chart and labs.  Questions were answered to the patient's satisfaction.     [place]

## 2019-04-16 ENCOUNTER — Encounter: Payer: Self-pay | Admitting: Gastroenterology

## 2019-04-16 ENCOUNTER — Encounter (HOSPITAL_COMMUNITY): Payer: Self-pay | Admitting: Gastroenterology

## 2019-04-16 LAB — SURGICAL PATHOLOGY

## 2019-04-19 ENCOUNTER — Other Ambulatory Visit: Payer: Self-pay

## 2019-04-19 NOTE — Patient Outreach (Addendum)
[place] [facility]) Care Management  04/19/2019  NELLY DEVERS 1940-12-31 [id number]   Telephone Assessment   Outreach attempt #1 to patient. Spoke with patient who shares his recent hospital experience. Patient was hospitalized from 04/13/2019 to 04/15/2019 related to GI Bleed(PCP office does TOC). He stets that issue has resolved. He denies any abnormal stools since returning home. Patient reports that he had a normal Bm this morning form what he could "see" due to limited vision. He has very supportive daughter and son in law that are helping him out. Patient confirmed that he has his meds-only one med change. He states that his daughter came over yesterday and filled med planner for him. His son in law will be calling to make his follow up appts and taking him to appts. He denies any RN CM needs or concerns at this time.   [facility] CM Care Plan Problem One     Most Recent Value  Care Plan Problem One  Patient at high risk for readmission to hospital given co-morbidities.  Role Documenting the Problem One  Care Management Telephonic [place] for Problem One  Active  [facility] Long Term Goal   Patient will have no readmission to the hospital over the next 31 days.  [facility] Long Term Goal Start Date  03/12/19  Interventions for Problem One Long Term Goal  RNCM assessed for any acute issues or sxs. RN CM reviewed with pt. action plan and when to seek medical attention.  [facility] CM Short Term Goal #1   Patient will complete all post discharge MD follow up appts over the next 30 days.  [facility]N CM Short Term Goal #1 Start Date  03/12/19  Interventions for Short Term Goal #1  RNCM assessed for appt times/dates. RN CM confirmed with pt. that family available to take him to appts and no barriers.    THN CM Care Plan Problem Two     Most Recent Value  Care Plan Problem Two  Knowledge deficit related to disease process and mgmt of HTN  Role Documenting the Problem Two  Care Management Telephonic  Coordinator  [facility] CM Short Term Goal #1   Patient will report daily monitoring of BP weight in the home over the next 30 days.  THN CM Short Term Goal #1 Start Date  03/25/19  Interventions for Short Term Goal #2   RNCM reinforced to pt. importance of family monitoring BP in the home and what to do for abnormal readings.   THN CM Short Term Goal #2   Patient will report adhering to low salt diet at least 75-100% of the time over the next 30 days.  THN CM Short Term Goal #2 Start Date  03/25/19  Interventions for Short Term Goal #2  RNCM reinforced low salt diet and healthy food options. RN CM discussed benefits of low salt diet with pt.     Plan: RN CM discussed with patient next outreach within a month. Patient gave verbal consent and in agreement with RN CM follow up timeframe. Patient aware that they may contact RN CM sooner for any issues or concerns.  [name], RN,BSN,CCM [place] Management Telephonic Care Management Coordinator Direct Phone: [phone] Toll Free: [phone] Fax: [phone]

## 2019-04-22 ENCOUNTER — Other Ambulatory Visit: Payer: Self-pay

## 2019-04-22 ENCOUNTER — Telehealth: Payer: Self-pay | Admitting: Cardiology

## 2019-04-22 NOTE — Telephone Encounter (Signed)
Was taken off a medicine while in the hospital and has some concerns

## 2019-04-22 NOTE — Patient Outreach (Signed)
[facility]) Care Management  04/22/2019  STORMY WEBB [birth year] [id number]    EMMI-General Discharge RED ON EMMI ALERT Day # 4 Date: 04/21/2019 Red Alert Reason: "Scheduled follow up? "    Outreach attempt #1 to patient. Spoke with patient who denies any acute issues or concerns. He voices he is doing well. Reviewed and addressed red alert with patient. Patient confirms that his son in law has made Md follow up appt for next week but patient can not remember which day. His son in law will be taking him to appt. He denies any other RN CM needs or concerns at this time.        Plan: RN CM will discussed with patient next outreach within two weeks. Patient gave verbal consent and in agreement with RN CM follow up timeframe. Patient aware that they may contact RN CM sooner for any issues or concerns.  [name], RN,BSN,CCM [place] Management Telephonic Care Management Coordinator Direct Phone: [phone] Toll Free: [phone] Fax: [phone]

## 2019-04-23 ENCOUNTER — Ambulatory Visit: Payer: Medicare HMO

## 2019-04-23 ENCOUNTER — Other Ambulatory Visit: Payer: Self-pay

## 2019-04-23 NOTE — Patient Outreach (Signed)
[place] [facility]) Care Management  04/23/2019  Richard Townsend 1941/04/22 [id number]   Care Coordination   Incoming call from patient and son inn law-[name]. He voices that patient is concerned and wanting to know why his Delene Loll was stopped. RN CM reviewed chart and discussed with patient that MD recommended to hold medicine until seen by cardiology for follow up appt on 04/29/2019. They voiced understanding. Advised pt/caregiver to call cardiology office if they had any further questions/concerns regarding the matter.    Plan: RN CM will follow up with patient within two weeks.   [name], RN,BSN,CCM [place] Management Telephonic Care Management Coordinator Direct Phone: [phone] Toll Free: [phone] Fax: [phone]

## 2019-04-23 NOTE — Telephone Encounter (Signed)
Telephone call to patient . States was taken off Entresto at the hospital and wanted to be sure it was OK. States BP staying in the high 80's to low [id number] systolically. Informed him this is why they took him off it for now. Instructed to further discuss this with Dr Harriet Masson next week at his follow up 04/29/19. Pt agreeable and had no further questions.

## 2019-04-23 NOTE — Telephone Encounter (Signed)
Please call patient .. they are still waiting for call.

## 2019-04-28 DIAGNOSIS — I2510 Atherosclerotic heart disease of native coronary artery without angina pectoris: Secondary | ICD-10-CM | POA: Diagnosis not present

## 2019-04-28 DIAGNOSIS — N200 Calculus of kidney: Secondary | ICD-10-CM | POA: Diagnosis not present

## 2019-04-28 DIAGNOSIS — R110 Nausea: Secondary | ICD-10-CM | POA: Diagnosis not present

## 2019-04-28 DIAGNOSIS — M109 Gout, unspecified: Secondary | ICD-10-CM | POA: Diagnosis not present

## 2019-04-28 DIAGNOSIS — K219 Gastro-esophageal reflux disease without esophagitis: Secondary | ICD-10-CM | POA: Diagnosis not present

## 2019-04-28 DIAGNOSIS — I714 Abdominal aortic aneurysm, without rupture: Secondary | ICD-10-CM | POA: Diagnosis not present

## 2019-04-28 DIAGNOSIS — E8881 Metabolic syndrome: Secondary | ICD-10-CM | POA: Diagnosis not present

## 2019-04-28 DIAGNOSIS — F17200 Nicotine dependence, unspecified, uncomplicated: Secondary | ICD-10-CM | POA: Diagnosis not present

## 2019-04-28 DIAGNOSIS — K922 Gastrointestinal hemorrhage, unspecified: Secondary | ICD-10-CM | POA: Diagnosis not present

## 2019-04-29 ENCOUNTER — Other Ambulatory Visit: Payer: Self-pay

## 2019-04-29 ENCOUNTER — Ambulatory Visit (INDEPENDENT_AMBULATORY_CARE_PROVIDER_SITE_OTHER): Payer: Medicare HMO | Admitting: Cardiology

## 2019-04-29 ENCOUNTER — Encounter: Payer: Self-pay | Admitting: Cardiology

## 2019-04-29 VITALS — BP 130/84 | HR 100 | Ht 70.0 in | Wt 185.0 lb

## 2019-04-29 DIAGNOSIS — D649 Anemia, unspecified: Secondary | ICD-10-CM

## 2019-04-29 DIAGNOSIS — I10 Essential (primary) hypertension: Secondary | ICD-10-CM

## 2019-04-29 DIAGNOSIS — I420 Dilated cardiomyopathy: Secondary | ICD-10-CM

## 2019-04-29 DIAGNOSIS — I519 Heart disease, unspecified: Secondary | ICD-10-CM | POA: Diagnosis not present

## 2019-04-29 DIAGNOSIS — K922 Gastrointestinal hemorrhage, unspecified: Secondary | ICD-10-CM | POA: Diagnosis not present

## 2019-04-29 MED ORDER — LISINOPRIL 2.5 MG PO TABS: 2.5000 mg | ORAL_TABLET | Freq: Every day | ORAL | 1 refills | Status: DC

## 2019-04-29 NOTE — Progress Notes (Signed)
Cardiology Office Note:    Date:  04/29/2019   ID:  Richard Townsend, DOB 10-Apr-1941, MRN [id number]  PCP:  [name], MD  Cardiologist:  [name], DO  Electrophysiologist:  None   Referring MD: No ref. provider found   Chief Complaint  Patient presents with  . Follow-up   History of Present Illness:    Richard Townsend is a 78 y.o. male with a hx of mild coronary artery disease, recent cardiac [facility] [place] where he was told that he had no significant blockage, DVT he has recently been placed on Eliquis 5 mg twice daily, hypertension, ischemic cardiomyopathy per documentation his most recent EF was 30%, hyperlipidemia and recent GI bleeding.  Patient is also legally blind.  I last saw the patient on April 12, 2019 at that time he was posthospitalization from a very complicated hospitalization which led [facility] [place].  He was treated for GI bleeding.  But given the concern for acute coronary syndrome he also did undergo left heart catheterization.  Prior to discharge he was started on Eliquis for DVT.  During my encounter I was concerned that the patient was on Eliquis for DVT surgery given his recent GI bleed therefore recommended that the patient get a CBC as well as follow-up with gastroenterology for potential endoscopy.    In addition I decreased his metoprolol succinate from 25 to 12.5 mg daily and also cut back on his Lasix 40 mg daily.  He was maintained on Entresto due to his ischemic cardiomyopathy.  Interim he was admitted at [facility] due to GI bleeding.  He did undergo an EGD which showed no erosive gastropathy or any stigmata of recent bleeding therefore he was recommended to be restarted on his Eliquis.  He had a transthoracic echocardiogram while in the hospital which show EF of 30 to 35%.  Today he is here for a follow-up visit he reports that he has been difficult with fatigue.   But denies chest pain, shortness of breath, nausea, vomiting.  Past Medical History:  Diagnosis Date  . AAA (abdominal aortic aneurysm) ([place]) 11/2011  . Abdominal aneurysm without mention of rupture 01/06/2012  . Abdominal tightness 01/11/2013  . Allergic rhinitis   . Arthritis   . DVT (deep venous thrombosis) ([place])   . Dysmetabolic syndrome   . Generalized osteoarthritis   . GERD (gastroesophageal reflux disease)   . Gout   . Headache(784.0)   . Hypercholesterolemia   . Hypertension   . Lower extremity edema   . Nausea alone 07/13/2013  . 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 Peptic ulcer   . Shortness of breath   . UGI bleed     Past Surgical History:  Procedure Laterality Date  . ABDOMINAL AORTIC ANEURYSM REPAIR  11/28/11   stent   . ABDOMINAL AORTIC ANEURYSM REPAIR    . APPENDECTOMY    . BIOPSY  04/15/2019   Procedure: BIOPSY;  Surgeon: Thornton Park, MD;  Location: [facility];  Service: Gastroenterology;;  . cataracts    . ESOPHAGOGASTRODUODENOSCOPY (EGD) WITH PROPOFOL N/A 04/15/2019   Procedure: ESOPHAGOGASTRODUODENOSCOPY (EGD) WITH PROPOFOL;  Surgeon: Thornton Park, MD;  Location: [facility];  Service: Gastroenterology;  Laterality: N/A;  . EYE SURGERY  2009   Left cataract  . 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 FOOT SURGERY    .  IR IVC FILTER PLMT / S&I /IMG GUID/MOD SED  04/14/2019  . KNEE ARTHROSCOPY    . TONSILLECTOMY      Current Medications: Current Meds  Medication Sig  . acetaminophen (TYLENOL) 325 MG tablet Take 650 mg by mouth every 6 (six) hours as needed for mild pain or headache.   . allopurinol (ZYLOPRIM) 300 MG tablet Take 1 tablet by mouth daily.  Marland Kitchen apixaban (ELIQUIS) 5 MG TABS tablet Take 5 mg by mouth 2 (two) times daily.  Marland Kitchen atorvastatin (LIPITOR) 40 MG tablet Take 20 mg by mouth daily. Taking 1/2 tab  . beta carotene w/minerals (OCUVITE) tablet Take 1 tablet by mouth daily.   . furosemide (LASIX) 40 MG tablet Take 1 tablet (40 mg total) by mouth daily.  . Homeopathic Products (THERAWORX RELIEF) FOAM Apply 1 application topically as needed (muscle pain).  . isosorbide mononitrate (IMDUR) 30 MG 24 hr tablet Take 1 tablet (30 mg total) by mouth daily.  . metoprolol succinate (TOPROL XL) 25 MG 24 hr tablet Take 0.5 tablets (12.5 mg total) by mouth daily.  . ondansetron (ZOFRAN) 24 MG tablet Take 24 mg by mouth as needed for nausea.   . pantoprazole (PROTONIX) 40 MG tablet Take 40 mg by mouth 2 (two) times daily.  . Potassium Chloride ER 20 MEQ TBCR Take 20 mEq by mouth daily.  . sucralfate (CARAFATE) 1 g tablet Take 1 g by mouth 4 (four) times daily -  with meals and at bedtime.     Allergies:   Patient has no known allergies.   Social History   Socioeconomic History  . Marital status: Married    Spouse name: Not on file  . Number of children: Not on file  . Years of education: Not on file  . Highest education level: Not on file  Occupational History  . Not on file  Social Needs  . Financial resource strain: Not on file  . Food insecurity    Worry: Not on file    Inability: Not on file  . Transportation needs    Medical: No    Non-medical: No  Tobacco Use  . Smoking status: Current Every Day Smoker    Packs/day: 0.25    Years: 51.00    Pack years: 12.75    Types: Cigarettes  . Smokeless tobacco: Never Used  Substance and Sexual Activity  . Alcohol use: No  . Drug use: No  . Sexual activity: Not on file  Lifestyle  . Physical activity    Days per week: Not on file    Minutes per session: Not on file  . Stress: Not on file  Relationships  . Social Herbalist on phone: Not on file    Gets together: Not on file    Attends religious service: Not on file    Active member of club or organization: Not on file    Attends meetings of clubs or organizations: Not on file    Relationship status: Not on file  Other Topics Concern  . Not on  file  Social History Narrative  . Not on file     Family History: The patient's family history includes Cancer in his mother and sister; Heart attack in his maternal grandfather; Hypertension in his brother, daughter, mother, and sister; Leukemia in his mother; Prostate cancer in his father.  ROS:   Review of Systems  Constitution: Negative for decreased appetite, fever and weight gain.  HENT: Negative for congestion, ear  discharge, hoarse voice and sore throat.   Eyes: Negative for discharge, redness, vision loss in right eye and visual halos.  Cardiovascular: Negative for chest pain, dyspnea on exertion, leg swelling, orthopnea and palpitations.  Respiratory: Negative for cough, hemoptysis, shortness of breath and snoring.   Endocrine: Negative for heat intolerance and polyphagia.  Hematologic/Lymphatic: Negative for bleeding problem. Does not bruise/bleed easily.  Skin: Negative for flushing, nail changes, rash and suspicious lesions.  Musculoskeletal: Negative for arthritis, joint pain, muscle cramps, myalgias, neck pain and stiffness.  Gastrointestinal: Negative for abdominal pain, bowel incontinence, diarrhea and excessive appetite.  Genitourinary: Negative for decreased libido, genital sores and incomplete emptying.  Neurological: Negative for brief paralysis, focal weakness, headaches and loss of balance.  Psychiatric/Behavioral: Negative for altered mental status, depression and suicidal ideas.  Allergic/Immunologic: Negative for HIV exposure and persistent infections.    EKGs/Labs/Other Studies Reviewed:    The following studies were reviewed today:   EKG: None today  Recent Labs: 04/13/2019: ALT 11 04/14/2019: Magnesium 1.9; Platelets 337; Platelets 344 04/15/2019: BUN 32; Creatinine, Ser 1.77; Hemoglobin 8.3; Potassium 3.8; Sodium 138  Recent Lipid Panel No results found for: CHOL, TRIG, HDL, CHOLHDL, VLDL, LDLCALC, LDLDIRECT  Physical Exam:    VS:  BP 130/84 (BP  Location: Left Arm, Patient Position: Sitting, Cuff Size: Normal)   Pulse 100   Ht 5\' 10"  (1.778 m)   Wt 185 lb (83.9 kg)   SpO2 97%   BMI 26.54 kg/m     Wt Readings from Last 3 Encounters:  04/29/19 185 lb (83.9 kg)  04/13/19 184 lb (83.5 kg)  04/13/19 184 lb 6.4 oz (83.6 kg)     GEN: Well nourished, well developed in no acute distress HEENT: Normal NECK: No JVD; No carotid bruits LYMPHATICS: No lymphadenopathy CARDIAC: S1S2 noted,RRR, no murmurs, rubs, gallops RESPIRATORY:  Clear to auscultation without rales, wheezing or rhonchi  ABDOMEN: Soft, non-tender, non-distended, +bowel sounds, no guarding. EXTREMITIES: No edema, No cyanosis, no clubbing MUSCULOSKELETAL:  No edema; No deformity  SKIN: Warm and dry NEUROLOGIC:  Alert and oriented x 3, non-focal PSYCHIATRIC:  Normal affect, good insight  ASSESSMENT:    1. Depressed left ventricular systolic function   2. Symptomatic anemia   3. Dilated cardiomyopathy ([place])   4. Essential hypertension    PLAN:    1.  He is currently on metoprolol succinate 12.5 mg daily.  He was unable to tolerate his low-dose Entresto due to hypotension therefore I am going to start the patient on low-dose ACE inhibitor lisinopril 2.5 mg daily and monitor him.  2.  Fatigue-I do suspect deconditioning is playing a role here.  I recommend that the patient do cardiac rehab but for now he wants to hold off on this.  In addition to rule out symptomatic anemia I am going to get a CBC to make sure that he is not having any decreasing hemoglobin.  3.  Hypertension-continue his Lasix 40 mg daily, lisinopril 2.5 daily, metoprolol succinate 12.5 mg daily and Imdur 30 mg daily.  The patient is in agreement with the above plan. The patient left the office in stable condition.  The patient will follow up in 3 months  Medication Adjustments/Labs and Tests Ordered: Current medicines are reviewed at length with the patient today.  Concerns regarding medicines are  outlined above.  Orders Placed This Encounter  Procedures  . CBC   Meds ordered this encounter  Medications  . lisinopril (ZESTRIL) 2.5 MG tablet  Sig: Take 1 tablet (2.5 mg total) by mouth daily.    Dispense:  90 tablet    Refill:  1    Patient Instructions  Medication Instructions:  Your physician has recommended you make the following change in your medication:   START: Lisinopril 2.5mg  Take 1 tab daily  *If you need a refill on your cardiac medications before your next appointment, please call your pharmacy*  Lab Work: Your physician recommends that you return for lab work in: [place]  If you have labs (blood work) drawn today and your tests are completely normal, you will receive your results only by: Marland Kitchen MyChart Message (if you have MyChart) OR . A paper copy in the mail If you have any lab test that is abnormal or we need to change your treatment, we will call you to review the results.  Testing/Procedures: None  Follow-Up: At [facility], you and your health needs are our priority.  As part of our continuing mission to provide you with exceptional heart care, we have created designated Provider Care Teams.  These Care Teams include your primary Cardiologist (physician) and Advanced Practice Providers (APPs -  Physician Assistants and Nurse Practitioners) who all work together to provide you with the care you need, when you need it.  Your next appointment:   3 month(s)  The format for your next appointment:   In Person  Provider:   [name], DO  Other Instructions      Adopting a Healthy Lifestyle.  Know what a healthy weight is for you (roughly BMI <25) and aim to maintain this   Aim for 7+ servings of fruits and vegetables daily   65-80+ fluid ounces of water or unsweet tea for healthy kidneys   Limit to max 1 drink of alcohol per day; avoid smoking/tobacco   Limit animal fats in diet for cholesterol and heart health - choose grass fed  whenever available   Avoid highly processed foods, and foods high in saturated/trans fats   Aim for low stress - take time to unwind and care for your mental health   Aim for 150 min of moderate intensity exercise weekly for heart health, and weights twice weekly for bone health   Aim for 7-9 hours of sleep daily   When it comes to diets, agreement about the perfect plan isnt easy to find, even among the experts. Experts at the [employer] developed an idea known as the Healthy Eating Plate. Just imagine a plate divided into logical, healthy portions.   The emphasis is on diet quality:   Load up on vegetables and fruits - one-half of your plate: Aim for color and variety, and remember that potatoes dont count.   Go for whole grains - one-quarter of your plate: Whole wheat, barley, wheat berries, quinoa, oats, brown rice, and foods made with them. If you want pasta, go with whole wheat pasta.   Protein power - one-quarter of your plate: Fish, chicken, beans, and nuts are all healthy, versatile protein sources. Limit red meat.   The diet, however, does go beyond the plate, offering a few other suggestions.   Use healthy plant oils, such as olive, canola, soy, corn, sunflower and peanut. Check the labels, and avoid partially hydrogenated oil, which have unhealthy trans fats.   If youre thirsty, drink water. Coffee and tea are good in moderation, but skip sugary drinks and limit milk and dairy products to one or two daily servings.  The type of carbohydrate in the diet is more important than the amount. Some sources of carbohydrates, such as vegetables, fruits, whole grains, and beans-are healthier than others.   Finally, stay active  Signed, [name], DO  04/29/2019 3:58 PM    [facility]

## 2019-04-29 NOTE — Patient Instructions (Signed)
Medication Instructions:  Your physician has recommended you make the following change in your medication:   START: Lisinopril 2.5mg  Take 1 tab daily  *If you need a refill on your cardiac medications before your next appointment, please call your pharmacy*  Lab Work: Your physician recommends that you return for lab work in: [place]  If you have labs (blood work) drawn today and your tests are completely normal, you will receive your results only by: Marland Kitchen MyChart Message (if you have MyChart) OR . A paper copy in the mail If you have any lab test that is abnormal or we need to change your treatment, we will call you to review the results.  Testing/Procedures: None  Follow-Up: At [facility], you and your health needs are our priority.  As part of our continuing mission to provide you with exceptional heart care, we have created designated Provider Care Teams.  These Care Teams include your primary Cardiologist (physician) and Advanced Practice Providers (APPs -  Physician Assistants and Nurse Practitioners) who all work together to provide you with the care you need, when you need it.  Your next appointment:   3 month(s)  The format for your next appointment:   In Person  Provider:   [name], DO  Other Instructions

## 2019-04-30 LAB — CBC
Hematocrit: 26.8 % — ABNORMAL LOW (ref 37.5–51.0)
Hemoglobin: 8.1 g/dL — ABNORMAL LOW (ref 13.0–17.7)
MCH: 24.8 pg — ABNORMAL LOW (ref 26.6–33.0)
MCHC: 30.2 g/dL — ABNORMAL LOW (ref 31.5–35.7)
MCV: 82 fL (ref 79–97)
Platelets: 480 10*3/uL — ABNORMAL HIGH (ref 150–450)
RBC: 3.27 x10E6/uL — ABNORMAL LOW (ref 4.14–5.80)
RDW: 17.2 % — ABNORMAL HIGH (ref 11.6–15.4)
WBC: 11.4 10*3/uL — ABNORMAL HIGH (ref 3.4–10.8)

## 2019-05-04 ENCOUNTER — Other Ambulatory Visit: Payer: Self-pay

## 2019-05-04 NOTE — Patient Outreach (Signed)
[place] [facility]) Care Management  05/04/2019  Richard Townsend 1940-08-30 [id number]   Telephone Assessment   Outreach attempt #1 to patient. Spoke with patient who denies any acute issues or concerns. He shares that he went to see cardiologist on last week. He had some of his meds changed due to him experiencing some hypotension and fatigue. He reports it is too soon to tell if meds are working yet. However, he does report that family has been checking his BP and BP has been back normal in the 1202/70s.  He had blood work drawn and states nurse called to advise him that all his labwork was "great" which made him feel so much better. He states that he continues to be "weak" but knows that is to be expected given all he has been through. Per MD notes, patient was offered cardiac rehab but declined. He continues to voice how blessed and thankful he is to still be alive and has such a supportive family to help him out. He denies any RN CM needs or concerns at this time.   THN CM Care Plan Problem One     Most Recent Value  Care Plan Problem One  Patient at high risk for readmission to hospital given co-morbidities.  Role Documenting the Problem One  Care Management Telephonic [place] for Problem One  Active  [facility] Long Term Goal   Patient will have no readmission to the hospital over the next 31 days.  THN Long Term Goal Start Date  03/12/19  [facility] CM Short Term Goal #1   Patient will complete all post discharge MD follow up appts over the next 30 days.  THN CM Short Term Goal #1 Start Date  03/12/19  THN CM Short Term Goal #1 Met Date  05/04/19    [facility] CM Care Plan Problem Two     Most Recent Value  Care Plan Problem Two  Knowledge deficit related to disease process and mgmt of HTN  Role Documenting the Problem Two  Care Management Telephonic Coordinator  [facility] CM Short Term Goal #1   Patient will report daily monitoring of BP weight in the home over the next 30 days.   THN CM Short Term Goal #1 Start Date  03/25/19  THN CM Short Term Goal #1 Met Date   05/04/19  THN CM Short Term Goal #2   Patient will report adhering to low salt diet at least 75-100% of the time over the next 30 days.  THN CM Short Term Goal #2 Start Date  03/25/19  THN CM Short Term Goal #2 Met Date  05/04/19    [facility] CM Care Plan Problem Three     Most Recent Value  Care Plan Problem Three  -- [Patient will fatigue and weakness-at risk for falls.]  Role Documenting the Problem Three  Care Management Telephonic Coordinator  Care Plan for Problem Three  Active  THN Long Term Goal   Patient will consider and agree to cardiac rehab over the next 90 days.  THN Long Term Goal Start Date  05/04/19  Interventions for Problem Three Long Term Goal  RN CM discussed beenfits of services. RN CM assessed for any barriers.   THN CM Short Term Goal #1   Patient will report no falls over the next 30 days  THN CM Short Term Goal #1 Start Date  05/04/19  Interventions for Short Term Goal #1  RN CM reviewed fall/safety measures with pt. RN  CM discussed importance of using DME.       Plan: RN CM discussed with patient next outreach within a month. Patient gave verbal consent and in agreement with RN CM follow up timeframe. Patient aware that they may contact RN CM sooner for any issues or concerns.   [name], RN,BSN,CCM [place] Management Telephonic Care Management Coordinator Direct Phone: [phone] Toll Free: [phone] Fax: [phone]

## 2019-05-12 DIAGNOSIS — F17200 Nicotine dependence, unspecified, uncomplicated: Secondary | ICD-10-CM | POA: Diagnosis not present

## 2019-05-12 DIAGNOSIS — M7022 Olecranon bursitis, left elbow: Secondary | ICD-10-CM | POA: Diagnosis not present

## 2019-05-12 DIAGNOSIS — M109 Gout, unspecified: Secondary | ICD-10-CM | POA: Diagnosis not present

## 2019-05-12 DIAGNOSIS — K219 Gastro-esophageal reflux disease without esophagitis: Secondary | ICD-10-CM | POA: Diagnosis not present

## 2019-05-12 DIAGNOSIS — R110 Nausea: Secondary | ICD-10-CM | POA: Diagnosis not present

## 2019-05-12 DIAGNOSIS — N200 Calculus of kidney: Secondary | ICD-10-CM | POA: Diagnosis not present

## 2019-05-12 DIAGNOSIS — E8881 Metabolic syndrome: Secondary | ICD-10-CM | POA: Diagnosis not present

## 2019-05-12 DIAGNOSIS — E7800 Pure hypercholesterolemia, unspecified: Secondary | ICD-10-CM | POA: Diagnosis not present

## 2019-05-12 DIAGNOSIS — I714 Abdominal aortic aneurysm, without rupture: Secondary | ICD-10-CM | POA: Diagnosis not present

## 2019-05-13 DIAGNOSIS — E8881 Metabolic syndrome: Secondary | ICD-10-CM | POA: Diagnosis not present

## 2019-05-13 DIAGNOSIS — M109 Gout, unspecified: Secondary | ICD-10-CM | POA: Diagnosis not present

## 2019-05-13 DIAGNOSIS — R110 Nausea: Secondary | ICD-10-CM | POA: Diagnosis not present

## 2019-05-13 DIAGNOSIS — N200 Calculus of kidney: Secondary | ICD-10-CM | POA: Diagnosis not present

## 2019-05-13 DIAGNOSIS — I714 Abdominal aortic aneurysm, without rupture: Secondary | ICD-10-CM | POA: Diagnosis not present

## 2019-05-13 DIAGNOSIS — E7800 Pure hypercholesterolemia, unspecified: Secondary | ICD-10-CM | POA: Diagnosis not present

## 2019-05-13 DIAGNOSIS — K219 Gastro-esophageal reflux disease without esophagitis: Secondary | ICD-10-CM | POA: Diagnosis not present

## 2019-05-13 DIAGNOSIS — F17200 Nicotine dependence, unspecified, uncomplicated: Secondary | ICD-10-CM | POA: Diagnosis not present

## 2019-05-13 DIAGNOSIS — M7022 Olecranon bursitis, left elbow: Secondary | ICD-10-CM | POA: Diagnosis not present

## 2019-05-15 DIAGNOSIS — E7800 Pure hypercholesterolemia, unspecified: Secondary | ICD-10-CM | POA: Diagnosis not present

## 2019-05-15 DIAGNOSIS — K219 Gastro-esophageal reflux disease without esophagitis: Secondary | ICD-10-CM | POA: Diagnosis not present

## 2019-05-15 DIAGNOSIS — F17200 Nicotine dependence, unspecified, uncomplicated: Secondary | ICD-10-CM | POA: Diagnosis not present

## 2019-05-15 DIAGNOSIS — M7022 Olecranon bursitis, left elbow: Secondary | ICD-10-CM | POA: Diagnosis not present

## 2019-05-15 DIAGNOSIS — R110 Nausea: Secondary | ICD-10-CM | POA: Diagnosis not present

## 2019-05-15 DIAGNOSIS — M109 Gout, unspecified: Secondary | ICD-10-CM | POA: Diagnosis not present

## 2019-05-15 DIAGNOSIS — I714 Abdominal aortic aneurysm, without rupture: Secondary | ICD-10-CM | POA: Diagnosis not present

## 2019-05-15 DIAGNOSIS — N200 Calculus of kidney: Secondary | ICD-10-CM | POA: Diagnosis not present

## 2019-05-15 DIAGNOSIS — E8881 Metabolic syndrome: Secondary | ICD-10-CM | POA: Diagnosis not present

## 2019-05-25 ENCOUNTER — Other Ambulatory Visit: Payer: Medicare HMO

## 2019-05-25 DIAGNOSIS — R110 Nausea: Secondary | ICD-10-CM | POA: Diagnosis not present

## 2019-05-25 DIAGNOSIS — K219 Gastro-esophageal reflux disease without esophagitis: Secondary | ICD-10-CM | POA: Diagnosis not present

## 2019-05-25 DIAGNOSIS — H6693 Otitis media, unspecified, bilateral: Secondary | ICD-10-CM | POA: Diagnosis not present

## 2019-05-25 DIAGNOSIS — H6093 Unspecified otitis externa, bilateral: Secondary | ICD-10-CM | POA: Diagnosis not present

## 2019-05-25 DIAGNOSIS — E7800 Pure hypercholesterolemia, unspecified: Secondary | ICD-10-CM | POA: Diagnosis not present

## 2019-05-25 DIAGNOSIS — B370 Candidal stomatitis: Secondary | ICD-10-CM | POA: Diagnosis not present

## 2019-05-25 DIAGNOSIS — N200 Calculus of kidney: Secondary | ICD-10-CM | POA: Diagnosis not present

## 2019-05-25 DIAGNOSIS — E8881 Metabolic syndrome: Secondary | ICD-10-CM | POA: Diagnosis not present

## 2019-05-25 DIAGNOSIS — I714 Abdominal aortic aneurysm, without rupture: Secondary | ICD-10-CM | POA: Diagnosis not present

## 2019-06-01 ENCOUNTER — Other Ambulatory Visit: Payer: Self-pay

## 2019-06-01 NOTE — Patient Outreach (Signed)
[place] [facility]) Care Management  06/01/2019  Richard Townsend [birth year] [id number]   Telephone Assessment   Outreach attempt #1 to patient. No answer at present.     Plan: RN CM will make outreach attempt to patient within 3-4 business days.   [name], RN,BSN,CCM [place] Management Telephonic Care Management Coordinator Direct Phone: [phone] Toll Free: [phone] Fax: [phone]

## 2019-06-02 ENCOUNTER — Other Ambulatory Visit: Payer: Self-pay

## 2019-06-02 NOTE — Patient Outreach (Signed)
[place] [facility]) Care Management  06/02/2019  Richard Townsend 1941-04-12 [id number]   Telephone Assessment    Outreach attempt #2 to patient. Spoke with patient who denies any acute issues or concerns at present. He reports that he is getting "stronger every week." He voices that BP has been doing good. He has had no further bleeding episodes. He states that he goes back to see MD next month. Patient shares that he lost "one of his good friends last week due to COVID-19." Condoleneces given. He reports that he is staying at home and his children are running his errands for him as to keep him away from crowds of people. He denies any RN CM needs or concerns at this time.     THN CM Care Plan Problem One     Most Recent Value  Care Plan Problem One  Patient at high risk for readmission to hospital given co-morbidities.  Role Documenting the Problem One  Care Management Telephonic [place] for Problem One  Active  [facility] Long Term Goal   Patient will have no readmission to the hospital over the next 31 days.  THN Long Term Goal Start Date  04/19/19  THN Long Term Goal Met Date  06/02/19    [facility] CM Care Plan Problem Two     Most Recent Value  Care Plan Problem Two  Knowledge deficit related to disease process and mgmt of HTN  Role Documenting the Problem Two  Care Management Telephonic Coordinator    [facility] CM Care Plan Problem Three     Most Recent Value  Care Plan Problem Three  -- [Patient will fatigue and weakness-at risk for falls.]  Care Plan for Problem Three  Active  THN Long Term Goal   Patient will consider and agree to cardiac rehab over the next 90 days.  THN Long Term Goal Start Date  05/04/19  Interventions for Problem Three Long Term Goal  RN CM reviewed and discussed benefits with pt  THN CM Short Term Goal #1   Patient will report no falls over the next 30 days  THN CM Short Term Goal #1 Start Date  05/04/19  [facility] CM Short Term Goal #1 Met Date   06/02/19  THN CM Short Term Goal #2   Patient will complete MD follow up appt within 30 days.  THN CM Short Term Goal #2 Start Date  06/02/19  Interventions for Short Term Goal #2  RN CM confirmed appt in place and that pt. has reliable transportation.      Plan: RN CM discussed with patient next outreach within the month of January. Patient gave verbal consent and in agreement with RN CM follow up and timeframe. Patient aware that they may contact RN CM sooner for any issues or concerns.   [name], RN,BSN,CCM [place] Management Telephonic Care Management Coordinator Direct Phone: [phone] Toll Free: [phone] Fax: [phone]

## 2019-06-08 DIAGNOSIS — R110 Nausea: Secondary | ICD-10-CM | POA: Diagnosis not present

## 2019-06-08 DIAGNOSIS — M109 Gout, unspecified: Secondary | ICD-10-CM | POA: Diagnosis not present

## 2019-06-08 DIAGNOSIS — K219 Gastro-esophageal reflux disease without esophagitis: Secondary | ICD-10-CM | POA: Diagnosis not present

## 2019-06-08 DIAGNOSIS — E8881 Metabolic syndrome: Secondary | ICD-10-CM | POA: Diagnosis not present

## 2019-06-08 DIAGNOSIS — F17200 Nicotine dependence, unspecified, uncomplicated: Secondary | ICD-10-CM | POA: Diagnosis not present

## 2019-06-08 DIAGNOSIS — N200 Calculus of kidney: Secondary | ICD-10-CM | POA: Diagnosis not present

## 2019-06-08 DIAGNOSIS — H6093 Unspecified otitis externa, bilateral: Secondary | ICD-10-CM | POA: Diagnosis not present

## 2019-06-08 DIAGNOSIS — E7800 Pure hypercholesterolemia, unspecified: Secondary | ICD-10-CM | POA: Diagnosis not present

## 2019-06-08 DIAGNOSIS — I714 Abdominal aortic aneurysm, without rupture: Secondary | ICD-10-CM | POA: Diagnosis not present

## 2019-07-01 ENCOUNTER — Other Ambulatory Visit: Payer: Self-pay

## 2019-07-01 NOTE — Patient Outreach (Signed)
[name]) Care Management  07/01/2019  Richard Townsend [birth year] [id number]   Telephone Assessment    Outreach attempt to patient. Spoke with patient who denies any acute issues or concerns. He states that it is taking him longer than expected to bounce back and get to being his normal self. RN CM explained to patient that his body has been through a great ordeal and sometimes healing process can be slow but that he is making progress. Patient reassured and does verbalize that he is making progress. He reports BP is WNL. Appetite remains good. He thinks he has gained about four pounds.He goes to see PCP on next week and cardiology beginning of next month Spouse is back at home following an acute illness/inpatient stay. Patient continues to voice that his family is very supportive in caring for him. He denies any RN CM needs or concerns at this time.  Medications Reviewed Today    Reviewed by Hayden Pedro, RN (Registered Nurse) on 07/01/19 at [phone]  Med List Status: <None>  Medication Order Taking? Sig Documenting Provider Last Dose Status Informant  acetaminophen (TYLENOL) 325 MG tablet [id number] No Take 650 mg by mouth every 6 (six) hours as needed for mild pain or headache.  [provider] Taking Active Family Member  allopurinol (ZYLOPRIM) 300 MG tablet [id number] No Take 1 tablet by mouth daily. [provider] Taking Active Family Member  apixaban (ELIQUIS) 5 MG TABS tablet [id number] No Take 5 mg by mouth 2 (two) times daily. [provider] Taking Active Family Member  atorvastatin (LIPITOR) 40 MG tablet [id number] No Take 20 mg by mouth daily. Taking 1/2 tab [provider] Taking Active Family Member  beta carotene w/minerals (OCUVITE) tablet [id number] No Take 1 tablet by mouth daily. [provider] Taking Active Family Member  furosemide (LASIX) 40 MG tablet [id number] No Take 1 tablet (40 mg total) by mouth  daily. [name], DO Taking Active Family Member  Homeopathic Products ([place]) [id number] No Apply 1 application topically as needed (muscle pain). [provider] Taking Active Family Member  isosorbide mononitrate (IMDUR) 30 MG 24 hr tablet [id number] No Take 1 tablet (30 mg total) by mouth daily. [name], DO Taking Active Family Member  lisinopril (ZESTRIL) 2.5 MG tablet [id number]  Take 1 tablet (2.5 mg total) by mouth daily. Tobb, Kardie, DO  Active   metoprolol succinate (TOPROL XL) 25 MG 24 hr tablet [id number] No Take 0.5 tablets (12.5 mg total) by mouth daily. [name], DO Taking Active Family Member  ondansetron (ZOFRAN) 24 MG tablet [id number] No Take 24 mg by mouth as needed for nausea.  [provider] Taking Active Family Member  pantoprazole (PROTONIX) 40 MG tablet [id number] No Take 40 mg by mouth 2 (two) times daily. [provider] Taking Active Family Member  Potassium Chloride ER 20 MEQ TBCR [id number] No Take 20 mEq by mouth daily. [name], DO Taking Active Family Member  sucralfate (CARAFATE) 1 g tablet [id number] No Take 1 g by mouth 4 (four) times daily -  with meals and at bedtime. [provider] Taking Active Family Member           [facility] CM Care Plan Problem Three     Most Recent Value  Care Plan Problem Three  -- [Patient will fatigue and weakness-at risk for falls.]  Care Plan for Problem Three  Active  [facility] Long Term Goal   Patient will consider and agree  to cardiac rehab over the next 90 days.  [facility] Long Term Goal Start Date  05/04/19  Interventions for Problem Three Long Term Goal  RN CM continued education to patient and encouraged pt to furhter discuss with MD at next appts.  [facility] CM Short Term Goal #2   Patient will complete MD follow up appt within 30 days.  [facility] CM Short Term Goal #2 Start Date  06/02/19  Interventions for Short Term Goal #2  RN CM assessed for appt schedule and confirmed pt will  attend. RN CM assessed for any barriers to getting to appt.      Plan: RN CM discussed with patient next outreach within the month of February. Patient gave verbal consent and in agreement with RN CM follow up and timeframe. Patient aware that they may contact RN CM sooner for any issues or concerns.  RN CM will send quarterly update to PCP.   Richard Montgomery, RN,BSN,CCM [place] Management Telephonic Care Management Coordinator Direct Phone: [phone] Toll Free: [phone] Fax: [phone]

## 2019-07-06 DIAGNOSIS — F17200 Nicotine dependence, unspecified, uncomplicated: Secondary | ICD-10-CM | POA: Diagnosis not present

## 2019-07-06 DIAGNOSIS — E8881 Metabolic syndrome: Secondary | ICD-10-CM | POA: Diagnosis not present

## 2019-07-06 DIAGNOSIS — M109 Gout, unspecified: Secondary | ICD-10-CM | POA: Diagnosis not present

## 2019-07-06 DIAGNOSIS — I2510 Atherosclerotic heart disease of native coronary artery without angina pectoris: Secondary | ICD-10-CM | POA: Diagnosis not present

## 2019-07-06 DIAGNOSIS — R110 Nausea: Secondary | ICD-10-CM | POA: Diagnosis not present

## 2019-07-06 DIAGNOSIS — N200 Calculus of kidney: Secondary | ICD-10-CM | POA: Diagnosis not present

## 2019-07-06 DIAGNOSIS — J309 Allergic rhinitis, unspecified: Secondary | ICD-10-CM | POA: Diagnosis not present

## 2019-07-06 DIAGNOSIS — K219 Gastro-esophageal reflux disease without esophagitis: Secondary | ICD-10-CM | POA: Diagnosis not present

## 2019-07-06 DIAGNOSIS — I714 Abdominal aortic aneurysm, without rupture: Secondary | ICD-10-CM | POA: Diagnosis not present

## 2019-07-19 DIAGNOSIS — M25422 Effusion, left elbow: Secondary | ICD-10-CM | POA: Diagnosis not present

## 2019-07-19 DIAGNOSIS — R519 Headache, unspecified: Secondary | ICD-10-CM | POA: Diagnosis not present

## 2019-07-19 DIAGNOSIS — J449 Chronic obstructive pulmonary disease, unspecified: Secondary | ICD-10-CM | POA: Diagnosis not present

## 2019-07-19 DIAGNOSIS — D6869 Other thrombophilia: Secondary | ICD-10-CM | POA: Diagnosis not present

## 2019-07-19 DIAGNOSIS — D692 Other nonthrombocytopenic purpura: Secondary | ICD-10-CM | POA: Diagnosis not present

## 2019-07-19 DIAGNOSIS — I25119 Atherosclerotic heart disease of native coronary artery with unspecified angina pectoris: Secondary | ICD-10-CM | POA: Diagnosis not present

## 2019-07-19 DIAGNOSIS — T464X5A Adverse effect of angiotensin-converting-enzyme inhibitors, initial encounter: Secondary | ICD-10-CM | POA: Diagnosis not present

## 2019-07-19 DIAGNOSIS — F5101 Primary insomnia: Secondary | ICD-10-CM | POA: Diagnosis not present

## 2019-07-19 DIAGNOSIS — I5022 Chronic systolic (congestive) heart failure: Secondary | ICD-10-CM | POA: Diagnosis not present

## 2019-07-27 ENCOUNTER — Other Ambulatory Visit: Payer: Self-pay

## 2019-07-27 DIAGNOSIS — M25522 Pain in left elbow: Secondary | ICD-10-CM | POA: Diagnosis not present

## 2019-07-27 NOTE — Patient Outreach (Signed)
[facility]) Care Management  07/27/2019  Richard Townsend [birth year] [id number]   Telephone Assessment    Outreach attempt #1 to patient. Spoke with spouse who reported patient was not available at this time.   Plan: RN CM will make outreach attempt to patient within the month of March.   Richard Montgomery, RN,BSN,CCM [place] Management Telephonic Care Management Coordinator Direct Phone: [phone] Toll Free: [phone] Fax: [phone]

## 2019-07-30 ENCOUNTER — Ambulatory Visit: Payer: Medicare HMO | Admitting: Cardiology

## 2019-08-06 ENCOUNTER — Other Ambulatory Visit: Payer: Self-pay

## 2019-08-06 ENCOUNTER — Ambulatory Visit: Payer: Medicare HMO | Admitting: Cardiology

## 2019-08-06 ENCOUNTER — Encounter: Payer: Self-pay | Admitting: Cardiology

## 2019-08-06 VITALS — BP 100/70 | HR 96 | Ht 70.0 in | Wt 184.0 lb

## 2019-08-06 DIAGNOSIS — E782 Mixed hyperlipidemia: Secondary | ICD-10-CM

## 2019-08-06 DIAGNOSIS — Z8719 Personal history of other diseases of the digestive system: Secondary | ICD-10-CM

## 2019-08-06 DIAGNOSIS — I255 Ischemic cardiomyopathy: Secondary | ICD-10-CM

## 2019-08-06 DIAGNOSIS — I5020 Unspecified systolic (congestive) heart failure: Secondary | ICD-10-CM

## 2019-08-06 HISTORY — DX: Ischemic cardiomyopathy: I25.5

## 2019-08-06 HISTORY — DX: Unspecified systolic (congestive) heart failure: I50.20

## 2019-08-06 HISTORY — DX: Personal history of other diseases of the digestive system: Z87.19

## 2019-08-06 HISTORY — DX: Mixed hyperlipidemia: E78.2

## 2019-08-06 MED ORDER — ENTRESTO 24-26 MG PO TABS: 1.0000 | ORAL_TABLET | Freq: Two times a day (BID) | ORAL | 5 refills | Status: DC

## 2019-08-06 NOTE — Patient Instructions (Signed)
Medication Instructions:   STOP: LIsinopril  START: Entresto 24/26 Take 1 tab twice daily starting 08/11/19  *If you need a refill on your cardiac medications before your next appointment, please call your pharmacy*   Lab Work: None If you have labs (blood work) drawn today and your tests are completely normal, you will receive your results only by: Marland Kitchen MyChart Message (if you have MyChart) OR . A paper copy in the mail If you have any lab test that is abnormal or we need to change your treatment, we will call you to review the results.   Testing/Procedures: NOne   Follow-Up: At [facility], you and your health needs are our priority.  As part of our continuing mission to provide you with exceptional heart care, we have created designated Provider Care Teams.  These Care Teams include your primary Cardiologist (physician) and Advanced Practice Providers (APPs -  Physician Assistants and Nurse Practitioners) who all work together to provide you with the care you need, when you need it.  We recommend signing up for the patient portal called "MyChart".  Sign up information is provided on this After Visit Summary.  MyChart is used to connect with patients for Virtual Visits (Telemedicine).  Patients are able to view lab/test results, encounter notes, upcoming appointments, etc.  Non-urgent messages can be sent to your provider as well.   To learn more about what you can do with MyChart, go to [email or web address].    Your next appointment:   1 month(s)  The format for your next appointment:   In Person  Provider:   [name], DO   Other Instructions

## 2019-08-06 NOTE — Progress Notes (Addendum)
Cardiology Office Note:    Date:  08/06/2019   ID:  Richard Townsend, DOB 1940/10/15, MRN [id number]  PCP:  [name], MD  Cardiologist:  [name], DO  Electrophysiologist:  None   Referring MD: [name], MD   Chief Complaint  Patient presents with  . Follow-up  Follow-up for ischemic cardiomyopathy  History of Present Illness:    Richard Townsend a 79 y.o.malewith a hx of mild coronary artery disease, recent cardiac [facility] [place] where he was told that he had no significant blockage,DVT he has recently been placed on Eliquis 5 mg twice daily, hypertension, ischemic cardiomyopathy per documentation his most recent EF was 30%, hyperlipidemia and recent GI bleeding. Patient is also legally blind.  I last saw the patient on April 12, 2019 at that time he was posthospitalization from a very complicated hospitalization which led [facility] [place].  He was treated for GI bleeding.  But given the concern for acute coronary syndrome he also did undergo left heart catheterization.  Prior to discharge he was started on Eliquis for DVT.  During my encounter I was concerned that the patient was on Eliquis for DVT surgery given his recent GI bleed therefore recommended that the patient get a CBC as well as follow-up with gastroenterology for potential endoscopy.    In addition I decreased his metoprolol succinate from 25 to 12.5 mg daily and also cut back on his Lasix 40 mg daily.  He was maintained on Entresto due to his ischemic cardiomyopathy.  On April 13, 2019 he was admitted at [facility] due to GI bleeding.  He did undergo an EGD which showed no erosive gastropathy or any stigmata of recent bleeding therefore he was recommended to be restarted on his Eliquis.  He had a transthoracic echocardiogram while in the hospital which show EF of 30 to 35%.  I did see the patient on April 29, 2019 at  that time I did give him low-dose lisinopril 2.5 as he was unable to tolerate Entresto.  He was continued on his Toprol-XL 12 and 5 mg daily.  In addition I recommended cardiac rehab but he deferred and said he did not need to do that.  Time I saw the patient he developed cough on his lisinopril and this was stopped by his primary care doctor.  She placed him on losartan 25 mg daily he tells me that he was having persistent chest pain on this medication therefore went back to taking the lisinopril.  He still developed cough.  He is here for follow-up visit today.  Past Medical History:  Diagnosis Date  . AAA (abdominal aortic aneurysm) ([place]) 11/2011  . Abdominal aneurysm without mention of rupture 01/06/2012  . Abdominal tightness 01/11/2013  . Allergic rhinitis   . Arthritis   . DVT (deep venous thrombosis) ([place])   . Dysmetabolic syndrome   . Generalized osteoarthritis   . GERD (gastroesophageal reflux disease)   . Gout   . Headache(784.0)   . Hypercholesterolemia   . Hypertension   . Lower extremity edema   . Nausea alone 07/13/2013  . 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 Peptic ulcer   . Shortness of breath   . UGI bleed     Past Surgical History:  Procedure Laterality Date  . ABDOMINAL AORTIC ANEURYSM REPAIR  11/28/11   stent   . ABDOMINAL AORTIC  ANEURYSM REPAIR    . APPENDECTOMY    . BIOPSY  04/15/2019   Procedure: BIOPSY;  Surgeon: Thornton Park, MD;  Location: [facility];  Service: Gastroenterology;;  . cataracts    . ESOPHAGOGASTRODUODENOSCOPY (EGD) WITH PROPOFOL N/A 04/15/2019   Procedure: ESOPHAGOGASTRODUODENOSCOPY (EGD) WITH PROPOFOL;  Surgeon: Thornton Park, MD;  Location: [facility];  Service: Gastroenterology;  Laterality: N/A;  . EYE SURGERY  2009   Left cataract  . 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 FOOT SURGERY    . IR IVC FILTER PLMT / S&I /IMG GUID/MOD SED  04/14/2019  . KNEE  ARTHROSCOPY    . TONSILLECTOMY      Current Medications: Current Meds  Medication Sig  . acetaminophen (TYLENOL) 325 MG tablet Take 650 mg by mouth every 6 (six) hours as needed for mild pain or headache.   . allopurinol (ZYLOPRIM) 300 MG tablet Take 1 tablet by mouth daily.  Marland Kitchen apixaban (ELIQUIS) 5 MG TABS tablet Take 5 mg by mouth 2 (two) times daily.  Marland Kitchen atorvastatin (LIPITOR) 40 MG tablet Take 20 mg by mouth daily. Taking 1/2 tab  . beta carotene w/minerals (OCUVITE) tablet Take 1 tablet by mouth daily.  . butalbital-acetaminophen-caffeine (FIORICET) 50-325-40 MG tablet   . furosemide (LASIX) 20 MG tablet 20 mg daily.  Marland Kitchen gabapentin (NEURONTIN) 300 MG capsule 300 mg daily.  Marland Kitchen HYDROcodone-acetaminophen (NORCO/VICODIN) 5-325 MG tablet as needed.  . isosorbide mononitrate (IMDUR) 30 MG 24 hr tablet Take 1 tablet (30 mg total) by mouth daily.  . metoprolol succinate (TOPROL XL) 25 MG 24 hr tablet Take 0.5 tablets (12.5 mg total) by mouth daily.  . pantoprazole (PROTONIX) 40 MG tablet Take 40 mg by mouth 2 (two) times daily.  . Potassium Chloride ER 20 MEQ TBCR Take 20 mEq by mouth daily.  . sucralfate (CARAFATE) 1 g tablet Take 1 g by mouth 4 (four) times daily -  with meals and at bedtime.  . [DISCONTINUED] lisinopril (ZESTRIL) 2.5 MG tablet Take 1 tablet (2.5 mg total) by mouth daily.     Allergies:   Lisinopril   Social History   Socioeconomic History  . Marital status: Married    Spouse name: Not on file  . Number of children: Not on file  . Years of education: Not on file  . Highest education level: Not on file  Occupational History  . Not on file  Tobacco Use  . Smoking status: Current Every Day Smoker    Packs/day: 0.25    Years: 51.00    Pack years: 12.75    Types: Cigarettes  . Smokeless tobacco: Never Used  Substance and Sexual Activity  . Alcohol use: No  . Drug use: No  . Sexual activity: Not on file  Other Topics Concern  . Not on file  Social History  Narrative  . Not on file   Social Determinants of Health   Financial Resource Strain:   . Difficulty of Paying Living Expenses: Not on file  Food Insecurity:   . Worried About Charity fundraiser in the Last Year: Not on file  . Ran Out of Food in the Last Year: Not on file  Transportation Needs: No Transportation Needs  . Lack of Transportation (Medical): No  . Lack of Transportation (Non-Medical): No  Physical Activity:   . Days of Exercise per Week: Not on file  . Minutes of Exercise per Session: Not on file  Stress:   . Feeling  of Stress : Not on file  Social Connections:   . Frequency of Communication with Friends and Family: Not on file  . Frequency of Social Gatherings with Friends and Family: Not on file  . Attends Religious Services: Not on file  . Active Member of Clubs or Organizations: Not on file  . Attends Archivist Meetings: Not on file  . Marital Status: Not on file     Family History: The patient's family history includes Cancer in his mother and sister; Heart attack in his maternal grandfather; Hypertension in his brother, daughter, mother, and sister; Leukemia in his mother; Prostate cancer in his father.  ROS:   Review of Systems  Constitution: Negative for decreased appetite, fever and weight gain.  HENT: Negative for congestion, ear discharge, hoarse voice and sore throat.   Eyes: Negative for discharge, redness, vision loss in right eye and visual halos.  Cardiovascular: Negative for chest pain, dyspnea on exertion, leg swelling, orthopnea and palpitations.  Respiratory: Negative for cough, hemoptysis, shortness of breath and snoring.   Endocrine: Negative for heat intolerance and polyphagia.  Hematologic/Lymphatic: Negative for bleeding problem. Does not bruise/bleed easily.  Skin: Negative for flushing, nail changes, rash and suspicious lesions.  Musculoskeletal: Negative for arthritis, joint pain, muscle cramps, myalgias, neck pain and  stiffness.  Gastrointestinal: Negative for abdominal pain, bowel incontinence, diarrhea and excessive appetite.  Genitourinary: Negative for decreased libido, genital sores and incomplete emptying.  Neurological: Negative for brief paralysis, focal weakness, headaches and loss of balance.  Psychiatric/Behavioral: Negative for altered mental status, depression and suicidal ideas.  Allergic/Immunologic: Negative for HIV exposure and persistent infections.    EKGs/Labs/Other Studies Reviewed:    The following studies were reviewed today:   EKG: None today.   Transthoracic echocardiogram IMPRESSIONS 04/15/2019 1. Left ventricular ejection fraction, by visual estimation, is 30 to  35%. The left ventricle has moderately decreased function. There is mildly  increased left ventricular hypertrophy.  2. The left ventricle demonstrates global hypokinesis.  3. Indeterminate diastolic filling due to E-A fusion.  4. Global right ventricle has normal systolic function.The right  ventricular size is normal. No increase in right ventricular wall  thickness.  5. Left atrial size was mildly dilated.  6. Right atrial size was mildly dilated.  7. Trivial pericardial effusion is present.  8. Mild mitral annular calcification.  9. The mitral valve is normal in structure. Mild mitral valve  regurgitation. No evidence of mitral stenosis.  10. The tricuspid valve is normal in structure. Tricuspid valve  regurgitation is not demonstrated.  11. The aortic valve was not well visualized. Aortic valve regurgitation  is not visualized. The aortic valve was calcified, unable to see well  enough to comment on leaflet number. I suspect there may be a degree of  aortic stenosis present but this study  was inadequate to definitively interrogate the aortic valve. Would  consider TEE if concerned for significant aortic stenosis.  12. TR signal is inadequate for assessing pulmonary artery systolic  pressure.    13. The inferior vena cava is dilated in size with >50% respiratory  variability, suggesting right atrial pressure of 8 mmHg.   Recent Labs: 04/13/2019: ALT 11 04/14/2019: Magnesium 1.9 04/15/2019: BUN 32; Creatinine, Ser 1.77; Potassium 3.8; Sodium 138 04/29/2019: Hemoglobin 8.1; Platelets 480  Recent Lipid Panel No results found for: CHOL, TRIG, HDL, CHOLHDL, VLDL, LDLCALC, LDLDIRECT  Physical Exam:    VS:  BP 100/70 (BP Location: Left Arm, Patient Position: Sitting, Cuff  Size: Normal)   Pulse 96   Ht 5\' 10"  (1.778 m)   Wt 184 lb (83.5 kg)   SpO2 97%   BMI 26.40 kg/m     Wt Readings from Last 3 Encounters:  08/06/19 184 lb (83.5 kg)  04/29/19 185 lb (83.9 kg)  04/13/19 184 lb (83.5 kg)     GEN: Well nourished, well developed in no acute distress HEENT: Normal NECK: No JVD; No carotid bruits LYMPHATICS: No lymphadenopathy CARDIAC: S1S2 noted,RRR, no murmurs, rubs, gallops RESPIRATORY:  Clear to auscultation without rales, wheezing or rhonchi  ABDOMEN: Soft, non-tender, non-distended, +bowel sounds, no guarding. EXTREMITIES: No edema, No cyanosis, no clubbing MUSCULOSKELETAL:  No deformity  SKIN: Warm and dry NEUROLOGIC:  Alert and oriented x 3, non-focal PSYCHIATRIC:  Normal affect, good insight  ASSESSMENT:    1. Ischemic cardiomyopathy   2. HFrEF (heart failure with reduced ejection fraction) ([place])   3. Mixed hyperlipidemia   4. History of GI bleed    PLAN:    He prefers not to be ever placed back on the losartan perhaps to stop the lisinopril as I do think the cough is a side effect.  Therefore I am going to have him watch how his lisinopril for minimal 36 hours.  After also the patient and his son-in-law who is in the office with him today that he can restart low-dose Entresto at 24-26 mg on March 3,2021.  I have no choice to start him on his medication but I have asked him and his son not to take his blood pressure daily.  He will remain on the rest of his  other medication which include Lasix 40 mg daily, Imdur 30 mg daily, Toprol-XL 12.5 mg daily.  He is on Eliquis for DVT.  He will need to follow-up with hematology to understand when he can get off this medication.  History of GI bleeding he denies any bleeding at this time or any fatigue.  Recent hemoglobin stable.   The patient is in agreement with the above plan. The patient left the office in stable condition.  The patient will follow up in   Medication Adjustments/Labs and Tests Ordered: Current medicines are reviewed at length with the patient today.  Concerns regarding medicines are outlined above.  No orders of the defined types were placed in this encounter.  Meds ordered this encounter  Medications  . sacubitril-valsartan (ENTRESTO) 24-26 MG    Sig: Take 1 tablet by mouth 2 (two) times daily.    Dispense:  60 tablet    Refill:  5    Patient Instructions  Medication Instructions:   STOP: LIsinopril  START: Entresto 24/26 Take 1 tab twice daily starting 08/11/19  *If you need a refill on your cardiac medications before your next appointment, please call your pharmacy*   Lab Work: None If you have labs (blood work) drawn today and your tests are completely normal, you will receive your results only by: Marland Kitchen MyChart Message (if you have MyChart) OR . A paper copy in the mail If you have any lab test that is abnormal or we need to change your treatment, we will call you to review the results.   Testing/Procedures: NOne   Follow-Up: At [facility], you and your health needs are our priority.  As part of our continuing mission to provide you with exceptional heart care, we have created designated Provider Care Teams.  These Care Teams include your primary Cardiologist (physician) and Advanced Practice Providers (APPs -  Physician Assistants and Nurse Practitioners) who all work together to provide you with the care you need, when you need it.  We recommend signing up  for the patient portal called "MyChart".  Sign up information is provided on this After Visit Summary.  MyChart is used to connect with patients for Virtual Visits (Telemedicine).  Patients are able to view lab/test results, encounter notes, upcoming appointments, etc.  Non-urgent messages can be sent to your provider as well.   To learn more about what you can do with MyChart, go to [email or web address].    Your next appointment:   1 month(s)  The format for your next appointment:   In Person  Provider:   [name], DO   Other Instructions      Adopting a Healthy Lifestyle.  Know what a healthy weight is for you (roughly BMI <25) and aim to maintain this   Aim for 7+ servings of fruits and vegetables daily   65-80+ fluid ounces of water or unsweet tea for healthy kidneys   Limit to max 1 drink of alcohol per day; avoid smoking/tobacco   Limit animal fats in diet for cholesterol and heart health - choose grass fed whenever available   Avoid highly processed foods, and foods high in saturated/trans fats   Aim for low stress - take time to unwind and care for your mental health   Aim for 150 min of moderate intensity exercise weekly for heart health, and weights twice weekly for bone health   Aim for 7-9 hours of sleep daily   When it comes to diets, agreement about the perfect plan isnt easy to find, even among the experts. Experts at the [place] developed an idea known as the Healthy Eating Plate. Just imagine a plate divided into logical, healthy portions.   The emphasis is on diet quality:   Load up on vegetables and fruits - one-half of your plate: Aim for color and variety, and remember that potatoes dont count.   Go for whole grains - one-quarter of your plate: Whole wheat, barley, wheat berries, quinoa, oats, brown rice, and foods made with them. If you want pasta, go with whole wheat pasta.   Protein power - one-quarter of your  plate: Fish, chicken, beans, and nuts are all healthy, versatile protein sources. Limit red meat.   The diet, however, does go beyond the plate, offering a few other suggestions.   Use healthy plant oils, such as olive, canola, soy, corn, sunflower and peanut. Check the labels, and avoid partially hydrogenated oil, which have unhealthy trans fats.   If youre thirsty, drink water. Coffee and tea are good in moderation, but skip sugary drinks and limit milk and dairy products to one or two daily servings.   The type of carbohydrate in the diet is more important than the amount. Some sources of carbohydrates, such as vegetables, fruits, whole grains, and beans-are healthier than others.   Finally, stay active  Signed, [name], DO  08/06/2019 4:53 PM    [facility]

## 2019-08-11 ENCOUNTER — Telehealth: Payer: Self-pay | Admitting: Cardiology

## 2019-08-11 NOTE — Telephone Encounter (Signed)
Please have him stop Entresto until I see him.

## 2019-08-11 NOTE — Telephone Encounter (Signed)
Spoke with patient. And patient's son in law [name]. Patient has been off of Lisinopril since Friday. Patient reports BP Monday 135/77, Tuesday he didn't check it and today he started his Entresto. Around 2pm patient began to feel lightheaded and weak he went inside and checked his blood pressure which was 76/56, he checked it in his other arm and it was 78/59. Patient sat down and rested for about an hour and his blood pressure was 106/76. At time of call BP is 101/79, HR 88. Patient reports he has no other symptoms besides feeling tired. He reports he hasn't felt good in two weeks but he's still going. Patient will be seen by home health nurse at 11am. Will route to MD for review. Patient to call back if symptoms worsen.

## 2019-08-11 NOTE — Telephone Encounter (Signed)
Pt c/o BP issue: STAT if pt c/o blurred vision, one-sided weakness or slurred speech  1. What are your last 5 BP readings? 104/66, 76/56, 135/88  2. Are you having any other symptoms (ex. Dizziness, headache, blurred vision, passed out)? Lightheaded   3. What is your BP issue? Patient saw Dr. Harriet Masson on 08/06/19 and Dr. Harriet Masson took patient off lisinopril so that he could be put back on Entresto on 08/11/19. Patients son is calling in to state that on Monday the patient's BP was 135/88. Today, the day that the patient was to start back on Entresto, he took his medication then took out the trash, when he checked his BP it was 76/56. Patient called his son who told him to sit down, don't smoke or drink any soda and call be back. Patient did so and reported that his BP was 104/66. Son states he does not know why his BP is so low but does have some concerns with it.   Pt c/o medication issue:  1. Name of Medication: sacubitril-valsartan (ENTRESTO) 24-26 MG  2. How are you currently taking this medication (dosage and times per day)? As directed  3. Are you having a reaction (difficulty breathing--STAT)? no  4. What is your medication issue? Patient's son states that his fathers pharmacy was unable to refill this medication due to insurance.

## 2019-08-12 NOTE — Telephone Encounter (Signed)
Spoke with patient. Patient informed of Dr. Terrial Rhodes recommendation to stop Entresto until next office visit.

## 2019-08-20 ENCOUNTER — Telehealth: Payer: Self-pay | Admitting: Cardiology

## 2019-08-20 MED ORDER — NITROGLYCERIN 0.4 MG SL SUBL: 0.4000 mg | SUBLINGUAL_TABLET | SUBLINGUAL | 3 refills | Status: DC | PRN

## 2019-08-20 MED ORDER — ISOSORBIDE MONONITRATE ER 60 MG PO TB24: 60.0000 mg | ORAL_TABLET | Freq: Every day | ORAL | 1 refills | Status: DC

## 2019-08-20 NOTE — Telephone Encounter (Signed)
Pt c/o medication issue:  1. Name of Medication: Nitroglycerin   2. How are you currently taking this medication (dosage and times per day)? 1 a day  3. Are you having a reaction (difficulty breathing--STAT)? no  4. What is your medication issue? Patient has been taking his wife's nitroglycerin every night before he lays down. Patients son, Lorrene Reid, states that Dr. Harriet Masson would not prescribe him nitroglycerin at his last OV. He states that the patient says it makes him feel better. Son states that his dad has been smoking more as well and reports chest pain when he lays down at night. Son is calling to see if a prescription for nitroglycerin can be written. Please advise. Please contact Son, [name].

## 2019-08-20 NOTE — Telephone Encounter (Signed)
Left message with [name] per dpr that Imdur is increased to 60 mg daily and nitroglycerin has been called in to the pharmacy.also called patient. And spoke with him.

## 2019-08-20 NOTE — Telephone Encounter (Signed)
Please advise 

## 2019-08-20 NOTE — Addendum Note (Signed)
Addended by: Particia Nearing B on: 08/20/2019 01:47 PM   Modules accepted: Orders

## 2019-08-20 NOTE — Telephone Encounter (Signed)
Please let the patient and his son that if he is using the SL nitroglycerin daily then, I like to increase his Imdur to 60 mg daily long-acting effect.  Also ok to send prn sl nitroglycerin

## 2019-08-23 ENCOUNTER — Other Ambulatory Visit: Payer: Self-pay

## 2019-08-23 MED ORDER — APIXABAN 5 MG PO TABS: 5.0000 mg | ORAL_TABLET | Freq: Two times a day (BID) | ORAL | 2 refills | Status: DC

## 2019-08-23 MED ORDER — POTASSIUM CHLORIDE ER 20 MEQ PO TBCR: 20.0000 meq | EXTENDED_RELEASE_TABLET | Freq: Every day | ORAL | 2 refills | Status: DC

## 2019-08-23 MED ORDER — METOPROLOL SUCCINATE ER 25 MG PO TB24: 12.5000 mg | ORAL_TABLET | Freq: Every day | ORAL | 2 refills | Status: DC

## 2019-08-23 NOTE — Patient Outreach (Addendum)
[name]) Care Management  08/23/2019  Richard Townsend August 11, 1940 [id number]   Telephone Assessment    Outreach attempt #1 to patient. Spoke with patient. He reports that he has been doing fairly well. He voices some frustrations about having to "take things slow and not be as active" as he once was. He reports that this has been the hardest thing for him to cope with. He admits that this at times makes him a little anxious. He states when this happens he feels like he gets "a little pain in chest." MD is aware and has ordered Nitro for prn usage. Patient reports that he has not had to take a dosage in about a week. However, when he does need it-sxs are relieved with one tablet. RN CM discussed with patient other ways to manage anxiety. He is aware to seek medical attention for any unresolved and/or worsening sxs. Appetite remains WNL for patient. He is unsure of next MD appt as family manages appts for him as well as his meds. RN CM discussed with pt. COVID-19 vaccine. Patient states that he "is torn and undecided" about rather or not he wants to take it. He voices that he has spoken with some friends who got it and got sick following it. RN CM discussed risks and benefits with patient. He will continue to think about it. He denies any RN CM needs or concerns at this time.    Plan: RN CM discussed with patient next outreach within the month of May. Patient gave verbal consent and in agreement with RN CM follow up and timeframe. Patient aware that they may contact RN CM sooner for any issues or concerns.    [name], RN,BSN,CCM [facility] Telephonic Care Management Coordinator Direct Phone: [phone] Toll Free: [phone] Fax: [phone]

## 2019-08-24 DIAGNOSIS — M25522 Pain in left elbow: Secondary | ICD-10-CM | POA: Diagnosis not present

## 2019-08-24 DIAGNOSIS — M7022 Olecranon bursitis, left elbow: Secondary | ICD-10-CM | POA: Diagnosis not present

## 2019-09-03 ENCOUNTER — Ambulatory Visit: Payer: Medicare HMO | Admitting: Cardiology

## 2019-09-03 ENCOUNTER — Encounter: Payer: Self-pay | Admitting: Cardiology

## 2019-09-03 ENCOUNTER — Other Ambulatory Visit: Payer: Self-pay

## 2019-09-03 VITALS — BP 104/62 | HR 88 | Ht 70.0 in | Wt 189.0 lb

## 2019-09-03 DIAGNOSIS — E782 Mixed hyperlipidemia: Secondary | ICD-10-CM | POA: Diagnosis not present

## 2019-09-03 DIAGNOSIS — Z8719 Personal history of other diseases of the digestive system: Secondary | ICD-10-CM | POA: Diagnosis not present

## 2019-09-03 DIAGNOSIS — I255 Ischemic cardiomyopathy: Secondary | ICD-10-CM | POA: Diagnosis not present

## 2019-09-03 DIAGNOSIS — I5020 Unspecified systolic (congestive) heart failure: Secondary | ICD-10-CM | POA: Diagnosis not present

## 2019-09-03 NOTE — Patient Instructions (Addendum)
Medication Instructions:   1) Increase Lasix to 40 mg in the morning and 20 mg in the evening for 5 days only   2) Hold Losartan until directed to restart    *If you need a refill on your cardiac medications before your next appointment, please call your pharmacy*   Lab Work: Bmet, [place]- today   If you have labs (blood work) drawn today and your tests are completely normal, you will receive your results only by: Marland Kitchen MyChart Message (if you have MyChart) OR . A paper copy in the mail If you have any lab test that is abnormal or we need to change your treatment, we will call you to review the results.   Testing/Procedures: None ordered    Follow-Up: At [facility], you and your health needs are our priority.  As part of our continuing mission to provide you with exceptional heart care, we have created designated Provider Care Teams.  These Care Teams include your primary Cardiologist (physician) and Advanced Practice Providers (APPs -  Physician Assistants and Nurse Practitioners) who all work together to provide you with the care you need, when you need it.  We recommend signing up for the patient portal  called "MyChart".  Sign up information is provided on this After Visit Summary.  MyChart is used to connect with patients for Virtual Visits (Telemedicine).  Patients are able to view lab/test results, encounter notes, upcoming appointments, etc.  Non-urgent messages can be sent to your provider as well.   To learn more about what you can do with MyChart, go to [email or web address].    Your next appointment:   1 month(s)  The format for your next appointment:   In Person  Provider:   [name], DO   Other Instructions None

## 2019-09-03 NOTE — Progress Notes (Signed)
Cardiology Office Note:    Date:  09/03/2019   ID:  [name], DOB 07-01-1940, MRN [id number]  PCP:  [name], MD  Cardiologist:  [name], DO  Electrophysiologist:  None   Referring MD: [name], MD   The patient is here for shortness of breath.   History of Present Illness:    Richard Townsend a 79 y.o.malewith a hx of mild coronary artery disease, recent cardiac catheterization at [facility] in [place] where he was told that he had no significant blockage,DVT he has recently been placed on Eliquis 5 mg twice daily, hypertension, ischemic cardiomyopathy per documentation his most recent EF was 30%, hyperlipidemia and recent GI bleeding. Patient is also legally blind.  I did see the patient on April 29, 2019 at that time I did give him low-dose lisinopril 2.5 as he was unable to tolerate Entresto.  He was continued on his Toprol-XL 12 and 5 mg daily.  In addition I recommended cardiac rehab but he deferred and said he did not need to do that.  The patient then he developed cough on his lisinopril and this was stopped by his primary care doctor.  She placed him on losartan 25 mg daily he tells me that he was having persistent chest pain on this medication therefore went back to taking the lisinopril.  He still developed cough.  At this visit on 08/06/2019, I stop the lisinopril wanted to give the patient another trial of the entresto at a low dose.   In the interim the patient was not able to tolerate the Entresto due to hypotension again.  For this medication was stopped.  Past Medical History:  Diagnosis Date  . AAA (abdominal aortic aneurysm) ([place]) 11/2011  . Abdominal aneurysm without mention of rupture 01/06/2012  . Abdominal tightness 01/11/2013  . Allergic rhinitis   . Arthritis   . DVT (deep venous thrombosis) ([place])   . Dysmetabolic syndrome   . Generalized osteoarthritis   . GERD (gastroesophageal reflux disease)   . Gout   . Headache(784.0)    . Hypercholesterolemia   . Hypertension   . Lower extremity edema   . Nausea alone 07/13/2013  . 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 Peptic ulcer   . Shortness of breath   . UGI bleed     Past Surgical History:  Procedure Laterality Date  . ABDOMINAL AORTIC ANEURYSM REPAIR  11/28/11   stent   . ABDOMINAL AORTIC ANEURYSM REPAIR    . APPENDECTOMY    . BIOPSY  04/15/2019   Procedure: BIOPSY;  Surgeon: Thornton Park, MD;  Location: [facility];  Service: Gastroenterology;;  . cataracts    . ESOPHAGOGASTRODUODENOSCOPY (EGD) WITH PROPOFOL N/A 04/15/2019   Procedure: ESOPHAGOGASTRODUODENOSCOPY (EGD) WITH PROPOFOL;  Surgeon: Thornton Park, MD;  Location: [facility];  Service: Gastroenterology;  Laterality: N/A;  . EYE SURGERY  2009   Left cataract  . 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 FOOT SURGERY    . IR IVC FILTER PLMT / S&I /IMG GUID/MOD SED  04/14/2019  . KNEE ARTHROSCOPY    . TONSILLECTOMY      Current Medications: Current Meds  Medication Sig  . acetaminophen (TYLENOL) 325 MG tablet Take 650 mg by mouth every 6 (six) hours as needed for mild pain or headache.   . allopurinol (ZYLOPRIM) 300 MG tablet Take 1 tablet by mouth daily.  Marland Kitchen apixaban (ELIQUIS) 5 MG  TABS tablet Take 1 tablet (5 mg total) by mouth 2 (two) times daily.  Marland Kitchen atorvastatin (LIPITOR) 40 MG tablet Take 20 mg by mouth daily. Taking 1/2 tab  . beta carotene w/minerals (OCUVITE) tablet Take 1 tablet by mouth daily.  . butalbital-acetaminophen-caffeine (FIORICET) 50-325-40 MG tablet   . clindamycin (CLEOCIN) 300 MG capsule 300 mg in the morning, at noon, and at bedtime.  . fexofenadine (ALLEGRA ODT) 30 MG disintegrating tablet Take 30 mg by mouth daily.  . furosemide (LASIX) 40 MG tablet 40 mg daily.  Marland Kitchen gabapentin (NEURONTIN) 300 MG capsule 300 mg daily.  . isosorbide mononitrate (IMDUR) 60 MG 24 hr tablet Take 1 tablet (60 mg total) by mouth  daily.  Marland Kitchen losartan (COZAAR) 25 MG tablet 25 mg daily.  . metoprolol succinate (TOPROL XL) 25 MG 24 hr tablet Take 0.5 tablets (12.5 mg total) by mouth daily.  . nitroGLYCERIN (NITROSTAT) 0.4 MG SL tablet Place 1 tablet (0.4 mg total) under the tongue every 5 (five) minutes as needed.  . nystatin (MYCOSTATIN) 100000 UNIT/ML suspension   . pantoprazole (PROTONIX) 40 MG tablet Take 40 mg by mouth 2 (two) times daily.  . Potassium Chloride ER 20 MEQ TBCR Take 20 mEq by mouth daily.  . sacubitril-valsartan (ENTRESTO) 24-26 MG Take 1 tablet by mouth 2 (two) times daily.  . sucralfate (CARAFATE) 1 g tablet Take 1 g by mouth 4 (four) times daily -  with meals and at bedtime.     Allergies:   Lisinopril   Social History   Socioeconomic History  . Marital status: Married    Spouse name: Not on file  . Number of children: Not on file  . Years of education: Not on file  . Highest education level: Not on file  Occupational History  . Not on file  Tobacco Use  . Smoking status: Current Every Day Smoker    Packs/day: 0.25    Years: 51.00    Pack years: 12.75    Types: Cigarettes  . Smokeless tobacco: Never Used  Substance and Sexual Activity  . Alcohol use: No  . Drug use: No  . Sexual activity: Not on file  Other Topics Concern  . Not on file  Social History Narrative  . Not on file   Social Determinants of Health   Financial Resource Strain:   . Difficulty of Paying Living Expenses:   Food Insecurity:   . Worried About Charity fundraiser in the Last Year:   . Arboriculturist in the Last Year:   Transportation Needs: No Transportation Needs  . Lack of Transportation (Medical): No  . Lack of Transportation (Non-Medical): No  Physical Activity:   . Days of Exercise per Week:   . Minutes of Exercise per Session:   Stress:   . Feeling of Stress :   Social Connections:   . Frequency of Communication with Friends and Family:   . Frequency of Social Gatherings with Friends and  Family:   . Attends Religious Services:   . Active Member of Clubs or Organizations:   . Attends Archivist Meetings:   Marland Kitchen Marital Status:      Family History: The patient's family history includes Cancer in his mother and sister; Heart attack in his maternal grandfather; Hypertension in his brother, daughter, mother, and sister; Leukemia in his mother; Prostate cancer in his father.  ROS:   Review of Systems  Constitution: Negative for decreased appetite, fever and weight gain.  HENT: Negative for congestion, ear discharge, hoarse voice and sore throat.   Eyes: Negative for discharge, redness, vision loss in right eye and visual halos.  Cardiovascular: Negative for chest pain, dyspnea on exertion, leg swelling, orthopnea and palpitations.  Respiratory: Negative for cough, hemoptysis, shortness of breath and snoring.   Endocrine: Negative for heat intolerance and polyphagia.  Hematologic/Lymphatic: Negative for bleeding problem. Does not bruise/bleed easily.  Skin: Negative for flushing, nail changes, rash and suspicious lesions.  Musculoskeletal: Negative for arthritis, joint pain, muscle cramps, myalgias, neck pain and stiffness.  Gastrointestinal: Negative for abdominal pain, bowel incontinence, diarrhea and excessive appetite.  Genitourinary: Negative for decreased libido, genital sores and incomplete emptying.  Neurological: Negative for brief paralysis, focal weakness, headaches and loss of balance.  Psychiatric/Behavioral: Negative for altered mental status, depression and suicidal ideas.  Allergic/Immunologic: Negative for HIV exposure and persistent infections.    EKGs/Labs/Other Studies Reviewed:    The following studies were reviewed today:   EKG:  None   TTE 04/15/2019 1. Left ventricular ejection fraction, by visual estimation, is 30 to  35%. The left ventricle has moderately decreased function. There is mildly  increased left ventricular hypertrophy.  2.  The left ventricle demonstrates global hypokinesis.  3. Indeterminate diastolic filling due to E-A fusion.  4. Global right ventricle has normal systolic function.The right  ventricular size is normal. No increase in right ventricular wall  thickness.  5. Left atrial size was mildly dilated.  6. Right atrial size was mildly dilated.  7. Trivial pericardial effusion is present.  8. Mild mitral annular calcification.  9. The mitral valve is normal in structure. Mild mitral valve  regurgitation. No evidence of mitral stenosis.  10. The tricuspid valve is normal in structure. Tricuspid valve  regurgitation is not demonstrated.  11. The aortic valve was not well visualized. Aortic valve regurgitation  is not visualized. The aortic valve was calcified, unable to see well  enough to comment on leaflet number. I suspect there may be a degree of  aortic stenosis present but this study  was inadequate to definitively interrogate the aortic valve. Would  consider TEE if concerned for significant aortic stenosis.  12. TR signal is inadequate for assessing pulmonary artery systolic  pressure.  13. The inferior vena cava is dilated in size with >50% respiratory  variability, suggesting right atrial pressure of 8 mmHg.   Recent Labs: 04/13/2019: ALT 11 04/14/2019: Magnesium 1.9 04/15/2019: BUN 32; Creatinine, Ser 1.77; Potassium 3.8; Sodium 138 04/29/2019: Hemoglobin 8.1; Platelets 480  Recent Lipid Panel No results found for: CHOL, TRIG, HDL, CHOLHDL, VLDL, LDLCALC, LDLDIRECT  Physical Exam:    VS:  BP 104/62 (BP Location: Left Arm, Patient Position: Sitting, Cuff Size: Normal)   Pulse 88   Ht 5\' 10"  (1.778 m)   Wt 189 lb (85.7 kg)   SpO2 95%   BMI 27.12 kg/m     Wt Readings from Last 3 Encounters:  09/03/19 189 lb (85.7 kg)  08/06/19 184 lb (83.5 kg)  04/29/19 185 lb (83.9 kg)     GEN: Well nourished, well developed in no acute distress HEENT: Normal NECK: No JVD; No carotid  bruits LYMPHATICS: No lymphadenopathy CARDIAC: S1S2 noted,RRR, no murmurs, rubs, gallops RESPIRATORY: Bilateral mid crackles, wheezing or rhonchi  ABDOMEN: Soft, non-tender, non-distended, +bowel sounds, no guarding. EXTREMITIES: Bilateral leg edema, No cyanosis, no clubbing MUSCULOSKELETAL:  No deformity  SKIN: Warm and dry NEUROLOGIC:  Alert and oriented x 3, non-focal PSYCHIATRIC:  Normal  affect, good insight  ASSESSMENT:    1. HFrEF (heart failure with reduced ejection fraction) ([place])   2. Ischemic cardiomyopathy   3. Mixed hyperlipidemia   4. History of GI bleed    PLAN:    His clinical exam does suggest volume overload but the patient has declined to go to the emergency room HI encouraged him but he has adamantly refused.  At this time I have no choice but to increase his Lasix dose from 40 mg daily to 40 mg in the morning and 20 mg at night for 5 days.  I am hoping that this extra dose and will help with some diuresis.  Blood work will be done today for BMP, mag, proBNP.  I have asked the patient to hold losartan given his lower systolic blood pressure to avoid hypotension.  The patient and his son-in-law ([name]) are both in agreement with the above plan. The patient left the office in stable condition.  He plans to see his PCP on April 5, therefore the patient will follow up in 1 month or sooner if needed.   Medication Adjustments/Labs and Tests Ordered: Current medicines are reviewed at length with the patient today.  Concerns regarding medicines are outlined above.  Orders Placed This Encounter  Procedures  . Basic metabolic panel  . Pro b natriuretic peptide (BNP)  . Magnesium   No orders of the defined types were placed in this encounter.   Patient Instructions  Medication Instructions:   1) Increase Lasix to 40 mg in the morning and 20 mg in the evening for 5 days only   2) Hold Losartan until directed to restart    *If you need a refill on your cardiac  medications before your next appointment, please call your pharmacy*   Lab Work: Bmet, [place]- today   If you have labs (blood work) drawn today and your tests are completely normal, you will receive your results only by: Marland Kitchen MyChart Message (if you have MyChart) OR . A paper copy in the mail If you have any lab test that is abnormal or we need to change your treatment, we will call you to review the results.   Testing/Procedures: None ordered    Follow-Up: At [facility], you and your health needs are our priority.  As part of our continuing mission to provide you with exceptional heart care, we have created designated Provider Care Teams.  These Care Teams include your primary Cardiologist (physician) and Advanced Practice Providers (APPs -  Physician Assistants and Nurse Practitioners) who all work together to provide you with the care you need, when you need it.  We recommend signing up for the patient portal  called "MyChart".  Sign up information is provided on this After Visit Summary.  MyChart is used to connect with patients for Virtual Visits (Telemedicine).  Patients are able to view lab/test results, encounter notes, upcoming appointments, etc.  Non-urgent messages can be sent to your provider as well.   To learn more about what you can do with MyChart, go to [email or web address].    Your next appointment:   1 month(s)  The format for your next appointment:   In Person  Provider:   [name], DO   Other Instructions None      Adopting a Healthy Lifestyle.  Know what a healthy weight is for you (roughly BMI <25) and aim to maintain this   Aim for 7+ servings of fruits and vegetables daily  65-80+ fluid ounces of water or unsweet tea for healthy kidneys   Limit to max 1 drink of alcohol per day; avoid smoking/tobacco   Limit animal fats in diet for cholesterol and heart health - choose grass fed whenever available   Avoid highly processed  foods, and foods high in saturated/trans fats   Aim for low stress - take time to unwind and care for your mental health   Aim for 150 min of moderate intensity exercise weekly for heart health, and weights twice weekly for bone health   Aim for 7-9 hours of sleep daily   When it comes to diets, agreement about the perfect plan isnt easy to find, even among the experts. Experts at the [employer] developed an idea known as the Healthy Eating Plate. Just imagine a plate divided into logical, healthy portions.   The emphasis is on diet quality:   Load up on vegetables and fruits - one-half of your plate: Aim for color and variety, and remember that potatoes dont count.   Go for whole grains - one-quarter of your plate: Whole wheat, barley, wheat berries, quinoa, oats, brown rice, and foods made with them. If you want pasta, go with whole wheat pasta.   Protein power - one-quarter of your plate: Fish, chicken, beans, and nuts are all healthy, versatile protein sources. Limit red meat.   The diet, however, does go beyond the plate, offering a few other suggestions.   Use healthy plant oils, such as olive, canola, soy, corn, sunflower and peanut. Check the labels, and avoid partially hydrogenated oil, which have unhealthy trans fats.   If youre thirsty, drink water. Coffee and tea are good in moderation, but skip sugary drinks and limit milk and dairy products to one or two daily servings.   The type of carbohydrate in the diet is more important than the amount. Some sources of carbohydrates, such as vegetables, fruits, whole grains, and beans-are healthier than others.   Finally, stay active  Signed, [name], DO  09/03/2019 11:00 AM    [place]

## 2019-09-04 LAB — BASIC METABOLIC PANEL
BUN/Creatinine Ratio: 19 (ref 10–24)
BUN: 37 mg/dL — ABNORMAL HIGH (ref 8–27)
CO2: 20 mmol/L (ref 20–29)
Calcium: 8.9 mg/dL (ref 8.6–10.2)
Chloride: 102 mmol/L (ref 96–106)
Creatinine, Ser: 1.94 mg/dL — ABNORMAL HIGH (ref 0.76–1.27)
GFR calc Af Amer: 38 mL/min/{1.73_m2} — ABNORMAL LOW (ref >59)
GFR calc non Af Amer: 33 mL/min/{1.73_m2} — ABNORMAL LOW (ref >59)
Glucose: 103 mg/dL — ABNORMAL HIGH (ref 65–99)
Potassium: 4 mmol/L (ref 3.5–5.2)
Sodium: 136 mmol/L (ref 134–144)

## 2019-09-04 LAB — MAGNESIUM: Magnesium: 2.1 mg/dL (ref 1.6–2.3)

## 2019-09-04 LAB — PRO B NATRIURETIC PEPTIDE: NT-Pro BNP: 17585 pg/mL — ABNORMAL HIGH (ref 0–486)

## 2019-09-08 DIAGNOSIS — D509 Iron deficiency anemia, unspecified: Secondary | ICD-10-CM | POA: Diagnosis not present

## 2019-09-08 DIAGNOSIS — R918 Other nonspecific abnormal finding of lung field: Secondary | ICD-10-CM | POA: Diagnosis not present

## 2019-09-08 DIAGNOSIS — I110 Hypertensive heart disease with heart failure: Secondary | ICD-10-CM | POA: Diagnosis not present

## 2019-09-08 DIAGNOSIS — J189 Pneumonia, unspecified organism: Secondary | ICD-10-CM | POA: Diagnosis not present

## 2019-09-08 DIAGNOSIS — I10 Essential (primary) hypertension: Secondary | ICD-10-CM | POA: Diagnosis not present

## 2019-09-08 DIAGNOSIS — I130 Hypertensive heart and chronic kidney disease with heart failure and stage 1 through stage 4 chronic kidney disease, or unspecified chronic kidney disease: Secondary | ICD-10-CM | POA: Diagnosis not present

## 2019-09-08 DIAGNOSIS — I509 Heart failure, unspecified: Secondary | ICD-10-CM | POA: Diagnosis not present

## 2019-09-08 DIAGNOSIS — Z7901 Long term (current) use of anticoagulants: Secondary | ICD-10-CM | POA: Diagnosis not present

## 2019-09-08 DIAGNOSIS — Z86718 Personal history of other venous thrombosis and embolism: Secondary | ICD-10-CM | POA: Diagnosis not present

## 2019-09-08 DIAGNOSIS — F17210 Nicotine dependence, cigarettes, uncomplicated: Secondary | ICD-10-CM | POA: Diagnosis not present

## 2019-09-08 DIAGNOSIS — D649 Anemia, unspecified: Secondary | ICD-10-CM | POA: Diagnosis not present

## 2019-09-08 DIAGNOSIS — I255 Ischemic cardiomyopathy: Secondary | ICD-10-CM | POA: Diagnosis not present

## 2019-09-08 DIAGNOSIS — I429 Cardiomyopathy, unspecified: Secondary | ICD-10-CM | POA: Diagnosis not present

## 2019-09-08 DIAGNOSIS — I5023 Acute on chronic systolic (congestive) heart failure: Secondary | ICD-10-CM | POA: Diagnosis not present

## 2019-09-08 DIAGNOSIS — I517 Cardiomegaly: Secondary | ICD-10-CM | POA: Diagnosis not present

## 2019-09-08 DIAGNOSIS — Z7902 Long term (current) use of antithrombotics/antiplatelets: Secondary | ICD-10-CM | POA: Diagnosis not present

## 2019-09-08 DIAGNOSIS — N189 Chronic kidney disease, unspecified: Secondary | ICD-10-CM | POA: Diagnosis not present

## 2019-09-08 DIAGNOSIS — N179 Acute kidney failure, unspecified: Secondary | ICD-10-CM | POA: Diagnosis not present

## 2019-09-09 DIAGNOSIS — I429 Cardiomyopathy, unspecified: Secondary | ICD-10-CM | POA: Diagnosis not present

## 2019-09-09 DIAGNOSIS — I509 Heart failure, unspecified: Secondary | ICD-10-CM | POA: Diagnosis not present

## 2019-09-09 DIAGNOSIS — Z7901 Long term (current) use of anticoagulants: Secondary | ICD-10-CM | POA: Diagnosis not present

## 2019-09-09 DIAGNOSIS — N179 Acute kidney failure, unspecified: Secondary | ICD-10-CM | POA: Diagnosis not present

## 2019-09-09 DIAGNOSIS — D649 Anemia, unspecified: Secondary | ICD-10-CM | POA: Diagnosis not present

## 2019-09-09 DIAGNOSIS — I10 Essential (primary) hypertension: Secondary | ICD-10-CM | POA: Diagnosis not present

## 2019-09-09 DIAGNOSIS — Z86718 Personal history of other venous thrombosis and embolism: Secondary | ICD-10-CM | POA: Diagnosis not present

## 2019-09-13 DIAGNOSIS — I25119 Atherosclerotic heart disease of native coronary artery with unspecified angina pectoris: Secondary | ICD-10-CM | POA: Diagnosis not present

## 2019-09-13 DIAGNOSIS — J449 Chronic obstructive pulmonary disease, unspecified: Secondary | ICD-10-CM | POA: Diagnosis not present

## 2019-09-13 DIAGNOSIS — Z09 Encounter for follow-up examination after completed treatment for conditions other than malignant neoplasm: Secondary | ICD-10-CM | POA: Diagnosis not present

## 2019-09-13 DIAGNOSIS — I5022 Chronic systolic (congestive) heart failure: Secondary | ICD-10-CM | POA: Diagnosis not present

## 2019-09-28 ENCOUNTER — Other Ambulatory Visit: Payer: Self-pay

## 2019-09-28 ENCOUNTER — Ambulatory Visit: Payer: Medicare HMO | Admitting: Cardiology

## 2019-09-28 ENCOUNTER — Encounter: Payer: Self-pay | Admitting: Cardiology

## 2019-09-28 VITALS — BP 120/70 | HR 96 | Ht 70.0 in | Wt 177.0 lb

## 2019-09-28 DIAGNOSIS — E782 Mixed hyperlipidemia: Secondary | ICD-10-CM

## 2019-09-28 DIAGNOSIS — I5020 Unspecified systolic (congestive) heart failure: Secondary | ICD-10-CM

## 2019-09-28 DIAGNOSIS — I255 Ischemic cardiomyopathy: Secondary | ICD-10-CM | POA: Diagnosis not present

## 2019-09-28 DIAGNOSIS — R0602 Shortness of breath: Secondary | ICD-10-CM

## 2019-09-28 NOTE — Progress Notes (Signed)
Cardiology Office Note:    Date:  09/28/2019   ID:  Richard Townsend, DOB Jan 21, 1941, MRN [id number]  PCP:  Richard Nakai, MD  Cardiologist:  [name], DO  Electrophysiologist:  None   Referring MD: Richard Nakai, MD   Chief Complaint  Patient presents with  . Follow-up    History of Present Illness:    Richard Townsend a 79 y.o.malewith a hx of mild coronary artery disease, recent cardiac catheterization at [facility] [place] where he was told that he had no significant blockage,DVT he has recently been placed on Eliquis 5 mg twice daily, hypertension, ischemic cardiomyopathy per documentation his most recent EF was 30%, hyperlipidemia and recent GI bleeding. Patient is also legally blind.  I last saw the patient on September 03, 2019 at that time he appeared to be volume overloaded.  Initially asked the patient to the emergency department but he would prefer to stay home therefore a increase his Lasix.  Blood work came back show significantly elevated BNP I called the patient again request that he go to the emergency department which he did.  He was hospitalized at [facility] for acute on chronic heart failure.  During his hospitalization he underwent significant diuresis, due to anemia his Eliquis was stopped.  His losartan isosorbide nitrate was also started due to borderline hypotension.  He is here today for follow-up visit he is with his son-in-law [name] he tells me that he has had some shortness of breath.  He recently stopped Eliquis.  He is discussing today to stop the Imdur as he had not stopped it.  He denies any chest pain.  Past Medical History:  Diagnosis Date  . AAA (abdominal aortic aneurysm) ([place]) 11/2011  . Abdominal aneurysm without mention of rupture 01/06/2012  . Abdominal tightness 01/11/2013  . Allergic rhinitis   . Arthritis   . DVT (deep venous thrombosis) ([place])   . Dysmetabolic syndrome   . Generalized osteoarthritis   . GERD (gastroesophageal  reflux disease)   . Gout   . Headache(784.0)   . Hypercholesterolemia   . Hypertension   . Lower extremity edema   . Nausea alone 07/13/2013  . 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 Peptic ulcer   . Shortness of breath   . UGI bleed     Past Surgical History:  Procedure Laterality Date  . ABDOMINAL AORTIC ANEURYSM REPAIR  11/28/11   stent   . ABDOMINAL AORTIC ANEURYSM REPAIR    . APPENDECTOMY    . BIOPSY  04/15/2019   Procedure: BIOPSY;  Surgeon: Thornton Park, MD;  Location: [facility];  Service: Gastroenterology;;  . cataracts    . ESOPHAGOGASTRODUODENOSCOPY (EGD) WITH PROPOFOL N/A 04/15/2019   Procedure: ESOPHAGOGASTRODUODENOSCOPY (EGD) WITH PROPOFOL;  Surgeon: Thornton Park, MD;  Location: [facility];  Service: Gastroenterology;  Laterality: N/A;  . EYE SURGERY  2009   Left cataract  . 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 FOOT SURGERY    . IR IVC FILTER PLMT / S&I /IMG GUID/MOD SED  04/14/2019  . KNEE ARTHROSCOPY    . TONSILLECTOMY      Current Medications: Current Meds  Medication Sig  . acetaminophen (TYLENOL) 325 MG tablet Take 650 mg by mouth every 6 (six) hours as needed for mild pain or headache.   . allopurinol (ZYLOPRIM) 300 MG tablet Take 1 tablet by mouth daily.  Marland Kitchen atorvastatin (LIPITOR) 40 MG tablet  Take 20 mg by mouth daily. Taking 1/2 tab  . beta carotene w/minerals (OCUVITE) tablet Take 1 tablet by mouth daily.  . butalbital-acetaminophen-caffeine (FIORICET) 50-325-40 MG tablet   . clindamycin (CLEOCIN) 300 MG capsule 300 mg in the morning, at noon, and at bedtime.  . ferrous sulfate 324 MG TBEC Take 324 mg by mouth daily with breakfast.  . fexofenadine (ALLEGRA ODT) 30 MG disintegrating tablet Take 30 mg by mouth daily.  . furosemide (LASIX) 40 MG tablet 40 mg 2 (two) times daily.   Marland Kitchen gabapentin (NEURONTIN) 300 MG capsule 300 mg daily.  . isosorbide mononitrate (IMDUR) 60 MG 24 hr  tablet Take 1 tablet (60 mg total) by mouth daily.  Marland Kitchen losartan (COZAAR) 25 MG tablet 25 mg daily.  . metoprolol succinate (TOPROL XL) 25 MG 24 hr tablet Take 0.5 tablets (12.5 mg total) by mouth daily.  . nitroGLYCERIN (NITROSTAT) 0.4 MG SL tablet Place 1 tablet (0.4 mg total) under the tongue every 5 (five) minutes as needed.  . nystatin (MYCOSTATIN) 100000 UNIT/ML suspension   . pantoprazole (PROTONIX) 40 MG tablet Take 40 mg by mouth 2 (two) times daily.  . Potassium Chloride ER 20 MEQ TBCR Take 20 mEq by mouth daily.  . sacubitril-valsartan (ENTRESTO) 24-26 MG Take 1 tablet by mouth 2 (two) times daily.  . sucralfate (CARAFATE) 1 g tablet Take 1 g by mouth 4 (four) times daily -  with meals and at bedtime.  . [DISCONTINUED] apixaban (ELIQUIS) 5 MG TABS tablet Take 1 tablet (5 mg total) by mouth 2 (two) times daily.     Allergies:   Lisinopril   Social History   Socioeconomic History  . Marital status: Married    Spouse name: Not on file  . Number of children: Not on file  . Years of education: Not on file  . Highest education level: Not on file  Occupational History  . Not on file  Tobacco Use  . Smoking status: Current Every Day Smoker    Packs/day: 0.25    Years: 51.00    Pack years: 12.75    Types: Cigarettes  . Smokeless tobacco: Never Used  Substance and Sexual Activity  . Alcohol use: No  . Drug use: No  . Sexual activity: Not on file  Other Topics Concern  . Not on file  Social History Narrative  . Not on file   Social Determinants of Health   Financial Resource Strain:   . Difficulty of Paying Living Expenses:   Food Insecurity:   . Worried About Charity fundraiser in the Last Year:   . Arboriculturist in the Last Year:   Transportation Needs: No Transportation Needs  . Lack of Transportation (Medical): No  . Lack of Transportation (Non-Medical): No  Physical Activity:   . Days of Exercise per Week:   . Minutes of Exercise per Session:   Stress:   .  Feeling of Stress :   Social Connections:   . Frequency of Communication with Friends and Family:   . Frequency of Social Gatherings with Friends and Family:   . Attends Religious Services:   . Active Member of Clubs or Organizations:   . Attends Archivist Meetings:   Marland Kitchen Marital Status:      Family History: The patient's family history includes Cancer in his mother and sister; Heart attack in his maternal grandfather; Hypertension in his brother, daughter, mother, and sister; Leukemia in his mother; Prostate cancer in  his father.  ROS:   Review of Systems  Constitution: Negative for decreased appetite, fever and weight gain.  HENT: Negative for congestion, ear discharge, hoarse voice and sore throat.   Eyes: Negative for discharge, redness, vision loss in right eye and visual halos.  Cardiovascular: Negative for chest pain, dyspnea on exertion, leg swelling, orthopnea and palpitations.  Respiratory: Negative for cough, hemoptysis, shortness of breath and snoring.   Endocrine: Negative for heat intolerance and polyphagia.  Hematologic/Lymphatic: Negative for bleeding problem. Does not bruise/bleed easily.  Skin: Negative for flushing, nail changes, rash and suspicious lesions.  Musculoskeletal: Negative for arthritis, joint pain, muscle cramps, myalgias, neck pain and stiffness.  Gastrointestinal: Negative for abdominal pain, bowel incontinence, diarrhea and excessive appetite.  Genitourinary: Negative for decreased libido, genital sores and incomplete emptying.  Neurological: Negative for brief paralysis, focal weakness, headaches and loss of balance.  Psychiatric/Behavioral: Negative for altered mental status, depression and suicidal ideas.  Allergic/Immunologic: Negative for HIV exposure and persistent infections.    EKGs/Labs/Other Studies Reviewed:    The following studies were reviewed today:   EKG: None today, however EKG on April 1 at [facility] showed  sinus rhythm, evidence of left atrial enlargement with LVH by Cornell criteria.   TTE IMPRESSIONS 04/15/2019 1. Left ventricular ejection fraction, by visual estimation, is 30 to  35%. The left ventricle has moderately decreased function. There is mildly  increased left ventricular hypertrophy.  2. The left ventricle demonstrates global hypokinesis.  3. Indeterminate diastolic filling due to E-A fusion.  4. Global right ventricle has normal systolic function.The right ventricular size is normal. No increase in right ventricular wall thickness.  5. Left atrial size was mildly dilated.  6. Right atrial size was mildly dilated.  7. Trivial pericardial effusion is present.  8. Mild mitral annular calcification.  9. The mitral valve is normal in structure. Mild mitral valve regurgitation. No evidence of mitral stenosis.  10. The tricuspid valve is normal in structure. Tricuspid valve regurgitation is not demonstrated.  11. The aortic valve was not well visualized. Aortic valve regurgitation is not visualized. The aortic valve was calcified, unable to see well enough to comment on leaflet number. I suspect there may be a degree of aortic stenosis present but this study was inadequate to definitively interrogate the aortic valve. Would consider TEE if concerned for significant aortic stenosis.  12. TR signal is inadequate for assessing pulmonary artery systolic  pressure. 13. The inferior vena cava is dilated in size with >50% respiratory variability, suggesting right atrial pressure of 8 mmHg.   Recent Labs: 04/13/2019: ALT 11 04/29/2019: Hemoglobin 8.1; Platelets 480 09/03/2019: BUN 37; Creatinine, Ser 1.94; Magnesium 2.1; NT-Pro BNP 17,585; Potassium 4.0; Sodium 136  Recent Lipid Panel No results found for: CHOL, TRIG, HDL, CHOLHDL, VLDL, LDLCALC, LDLDIRECT  Physical Exam:    VS:  BP 120/70 (BP Location: Left Arm, Patient Position: Sitting, Cuff Size: Normal)   Pulse 96   Ht 5\' 10"   (1.778 m)   Wt 177 lb (80.3 kg)   SpO2 95%   BMI 25.40 kg/m     Wt Readings from Last 3 Encounters:  09/28/19 177 lb (80.3 kg)  09/03/19 189 lb (85.7 kg)  08/06/19 184 lb (83.5 kg)     GEN: Well nourished, well developed in no acute distress HEENT: Normal NECK: No JVD; No carotid bruits LYMPHATICS: No lymphadenopathy CARDIAC: S1S2 noted,RRR, no murmurs, rubs, gallops RESPIRATORY:  Mild diffuse wheezing or rhonchi  ABDOMEN: Soft, non-tender,  non-distended, +bowel sounds, no guarding. EXTREMITIES: No edema, No cyanosis, no clubbing MUSCULOSKELETAL:  No deformity  SKIN: Warm and dry NEUROLOGIC:  Alert and oriented x 3, non-focal PSYCHIATRIC:  Normal affect, good insight  ASSESSMENT:    1. Shortness of breath   2. HFrEF (heart failure with reduced ejection fraction) ([place])   3. Ischemic cardiomyopathy   4. Mixed hyperlipidemia    PLAN:    Shortness of breath - he appears to be euvolemic. There is mild diffuse wheezing. He does have inhalers at home which I have asked the patient to take this and will follow up and see if he experience any improvement.  If no improvement on his regular inhalers I will repeat his echocardiogram to assess his aortic valve.  Heart failure with reduced ejection fraction-the patient appears to be euvolemic.  We will continue him on his current Lasix dosing.  Ischemic cardiomyopathy continue his beta-blocker, it has been difficult to add medications like Aldactone, and Entresto.  As he has had hypotension with this medication.  His losartan was stopped while he was in the hospital we will continue to monitor and see if we can introduce this medication.  He wants to stay off his Imdur for now, is okay for now however asked the patient if he started experience has been to restart his Imdur.  Hyperlipidemia continue his Lipitor  The patient is in agreement with the above plan. The patient left the office in stable condition.  The patient will follow up in  1 month.    Medication Adjustments/Labs and Tests Ordered: Current medicines are reviewed at length with the patient today.  Concerns regarding medicines are outlined above.  No orders of the defined types were placed in this encounter.  No orders of the defined types were placed in this encounter.   Patient Instructions  Medication Instructions:  Your physician has recommended you make the following change in your medication  Hold your Imdur for now and start taking once daily when you have chest pain.  *If you need a refill on your cardiac medications before your next appointment, please call your pharmacy*   Lab Work: If you have labs (blood work) drawn today and your tests are completely normal, you will receive your results only by: Marland Kitchen MyChart Message (if you have MyChart) OR . A paper copy in the mail If you have any lab test that is abnormal or we need to change your treatment, we will call you to review the results.  Follow-Up: At [facility], you and your health needs are our priority.  As part of our continuing mission to provide you with exceptional heart care, we have created designated Provider Care Teams.  These Care Teams include your primary Cardiologist (physician) and Advanced Practice Providers (APPs -  Physician Assistants and Nurse Practitioners) who all work together to provide you with the care you need, when you need it.  We recommend signing up for the patient portal called "MyChart".  Sign up information is provided on this After Visit Summary.  MyChart is used to connect with patients for Virtual Visits (Telemedicine).  Patients are able to view lab/test results, encounter notes, upcoming appointments, etc.  Non-urgent messages can be sent to your provider as well.   To learn more about what you can do with MyChart, go to [email or web address].    Your next appointment:   1 month(s)  The format for your next appointment:   In Person  Provider:  You will see Meryem Haertel, DO.        Adopting a Healthy Lifestyle.  Know what a healthy weight is for you (roughly BMI <25) and aim to maintain this   Aim for 7+ servings of fruits and vegetables daily   65-80+ fluid ounces of water or unsweet tea for healthy kidneys   Limit to max 1 drink of alcohol per day; avoid smoking/tobacco   Limit animal fats in diet for cholesterol and heart health - choose grass fed whenever available   Avoid highly processed foods, and foods high in saturated/trans fats   Aim for low stress - take time to unwind and care for your mental health   Aim for 150 min of moderate intensity exercise weekly for heart health, and weights twice weekly for bone health   Aim for 7-9 hours of sleep daily   When it comes to diets, agreement about the perfect plan isnt easy to find, even among the experts. Experts at the [employer] developed an idea known as the Healthy Eating Plate. Just imagine a plate divided into logical, healthy portions.   The emphasis is on diet quality:   Load up on vegetables and fruits - one-half of your plate: Aim for color and variety, and remember that potatoes dont count.   Go for whole grains - one-quarter of your plate: Whole wheat, barley, wheat berries, quinoa, oats, brown rice, and foods made with them. If you want pasta, go with whole wheat pasta.   Protein power - one-quarter of your plate: Fish, chicken, beans, and nuts are all healthy, versatile protein sources. Limit red meat.   The diet, however, does go beyond the plate, offering a few other suggestions.   Use healthy plant oils, such as olive, canola, soy, corn, sunflower and peanut. Check the labels, and avoid partially hydrogenated oil, which have unhealthy trans fats.   If youre thirsty, drink water. Coffee and tea are good in moderation, but skip sugary drinks and limit milk and dairy products to one or two daily servings.   The type of  carbohydrate in the diet is more important than the amount. Some sources of carbohydrates, such as vegetables, fruits, whole grains, and beans-are healthier than others.   Finally, stay active  Signed, [name], DO  09/28/2019 9:11 AM    [place]

## 2019-09-28 NOTE — Patient Instructions (Addendum)
Medication Instructions:  Your physician has recommended you make the following change in your medication  Hold your Imdur for now and start taking once daily when you have chest pain.  Discontinue Entresto and Losartan   *If you need a refill on your cardiac medications before your next appointment, please call your pharmacy*   Lab Work: If you have labs (blood work) drawn today and your tests are completely normal, you will receive your results only by: Marland Kitchen MyChart Message (if you have MyChart) OR . A paper copy in the mail If you have any lab test that is abnormal or we need to change your treatment, we will call you to review the results.  Follow-Up: At [facility], you and your health needs are our priority.  As part of our continuing mission to provide you with exceptional heart care, we have created designated Provider Care Teams.  These Care Teams include your primary Cardiologist (physician) and Advanced Practice Providers (APPs -  Physician Assistants and Nurse Practitioners) who all work together to provide you with the care you need, when you need it.  We recommend signing up for the patient portal called "MyChart".  Sign up information is provided on this After Visit Summary.  MyChart is used to connect with patients for Virtual Visits (Telemedicine).  Patients are able to view lab/test results, encounter notes, upcoming appointments, etc.  Non-urgent messages can be sent to your provider as well.   To learn more about what you can do with MyChart, go to [email or web address].    Your next appointment:   1 month(s)  The format for your next appointment:   In Person  Provider:    You will see Kardie Tobb, DO.

## 2019-09-28 NOTE — Telephone Encounter (Signed)
This encounter was created in error - please disregard.

## 2019-10-01 ENCOUNTER — Ambulatory Visit: Payer: Medicare HMO | Admitting: Cardiology

## 2019-10-08 ENCOUNTER — Other Ambulatory Visit: Payer: Self-pay | Admitting: Cardiology

## 2019-10-14 ENCOUNTER — Other Ambulatory Visit: Payer: Self-pay | Admitting: Cardiology

## 2019-10-25 ENCOUNTER — Other Ambulatory Visit: Payer: Self-pay

## 2019-10-25 NOTE — Patient Outreach (Signed)
[facility]) Care Management  10/25/2019  Richard Townsend [birth year] [id number]   Telephone Assessment   Outreach attempt # 1 to patient. Spoke with patient. He voices that he is doing fairly well. He complains of still "feeling weak" at times. He denies any recent falls. He reports that due to COVID-19 he is primarily staying at home. Patient voices that he is not sure if he will get COVID-19 vaccine. He is afraid of his health problems causing side effects. He voices that his children do not want him to get it. Discussed with pt  benefits of vaccine and encouraged pt to talk it over with his MD. He voiced understanding. He reports that he saw cardiologist a few weeks ago. He was having some issues with fluid retention. His diuretic was increased. Patient states that he has been urinating a lot and no longer having swelling. He has scale in the home and family assisting him with monitoring wgt due to vision problems. He reports wgt staying stable and only fluctuating within 1-2 pounds. Appetite remains WNL for him. He denies any RN CM needs or concerns at this time.   Medications Reviewed Today    Reviewed by Hayden Pedro, RN (Registered Nurse) on 10/25/19 at 1014  Med List Status: <None>  Medication Order Taking? Sig Documenting Provider Last Dose Status Informant  acetaminophen (TYLENOL) 325 MG tablet [id number] No Take 650 mg by mouth every 6 (six) hours as needed for mild pain or headache.  [provider] Taking Active Family Member  allopurinol (ZYLOPRIM) 300 MG tablet [id number] No Take 1 tablet by mouth daily. [provider] Taking Active Family Member  atorvastatin (LIPITOR) 40 MG tablet [id number] No Take 20 mg by mouth daily. Taking 1/2 tab [provider] Taking Active Family Member  beta carotene w/minerals (OCUVITE) tablet [id number] No Take 1 tablet by mouth daily. [provider] Taking Active Family Member   butalbital-acetaminophen-caffeine (FIORICET) [phone] MG tablet [id number] No  [provider] Taking Active   clindamycin (CLEOCIN) 300 MG capsule [id number] No 300 mg in the morning, at noon, and at bedtime. [provider] Taking Active   ferrous sulfate 324 MG TBEC [id number] No Take 324 mg by mouth daily with breakfast. [provider] Taking Active   fexofenadine (ALLEGRA ODT) 30 MG disintegrating tablet [id number] No Take 30 mg by mouth daily. [provider] Taking Active   furosemide (LASIX) 40 MG tablet [id number] No 40 mg 2 (two) times daily.  [provider] Taking Active   gabapentin (NEURONTIN) 300 MG capsule [id number] No 300 mg daily. [provider] Taking Active   isosorbide mononitrate (IMDUR) 60 MG 24 hr tablet [id number] No Take 1 tablet (60 mg total) by mouth daily. [name], DO Taking Active   metoprolol succinate (TOPROL-XL) 25 MG 24 hr tablet [id number]  TAKE 1/2 TABLET(12.5 MG) BY MOUTH DAILY Tobb, Kardie, DO  Active   nitroGLYCERIN (NITROSTAT) 0.4 MG SL tablet [id number] No Place 1 tablet (0.4 mg total) under the tongue every 5 (five) minutes as needed. Tobb, Kardie, DO Taking Active   nystatin (MYCOSTATIN) 100000 UNIT/ML suspension [id number] No  [provider] Taking Active   pantoprazole (PROTONIX) 40 MG tablet [id number] No Take 40 mg by mouth 2 (two) times daily. [provider] Taking Active Family Member  Potassium Chloride ER 20 MEQ TBCR [id number] No Take 20 mEq by mouth daily. [name], DO Taking Active   sucralfate (CARAFATE) 1 g tablet [id number]  No Take 1 g by mouth 4 (four) times daily -  with meals and at bedtime. [provider] Taking Active Family Member         Fall Risk  10/25/2019 07/01/2019 03/12/2019  Falls in the past year? 1 1 1   Number falls in past yr: 1 1 1   Injury with Fall? 1 1 1   Risk for fall due to : Impaired balance/gait;Impaired mobility;Medication side  effect;Impaired vision;History of fall(s) History of fall(s);Impaired balance/gait;Impaired mobility;Impaired vision;Medication side effect History of fall(s);Impaired vision;Impaired balance/gait;Impaired mobility  Follow up Education provided;Falls evaluation completed Education provided;Falls evaluation completed Falls evaluation completed;Education provided     Patient Outreach Telephone from 10/25/2019 in [place]  PHQ-2 Total Score  0       Transportation Needs: No Transportation Needs  . Lack of Transportation (Medical): No  . Lack of Transportation (Non-Medical): No     Food Insecurity: No Food Insecurity  . Worried About Charity fundraiser in the Last Year: Never true  . Ran Out of Food in the Last Year: Never true   [facility] CM Care Plan Problem One     Most Recent Value  Care Plan Problem One  Health maintenance-pt needs COVID-19 vaccine  Role Documenting the Problem One  Care Management Telephonic [place] for Problem One  Active  [facility] Long Term Goal   Patient will report making an appt for COVID-19 vaccine within the next 90 days.  [facility] Long Term Goal Start Date  08/23/19  Interventions for Problem One Long Term Goal  RNCM reviewd and discussed benefits of vaccine. RN CM addressed pt's fears/cocnerns. RN CM isntructed pt to talk with MD regarding getting vaccine.     [facility] CM Care Plan Problem Two     Most Recent Value  Care Plan Problem Two  Knowledge deficit related to disease process and mgmt of HTN  Role Documenting the Problem Two  Care Management Telephonic Coordinator  Care Plan for Problem Two  Active  Interventions for Problem Two Long Term Goal   RNCM discussed improtance of home monitoring. RN CM reviewed s/s of worsening condition and when to seek medical attention. RN CM assessed for medication & diet adherence.  [facility] Long Term Goal  Patient will monitor wgt and BP in the home and report no abnormal values over the next 90 days.  [facility] Long  Term Goal Start Date  10/25/19    [facility] CM Care Plan Problem Three     Most Recent Value  Care Plan Problem Three  Patient with fatigue and weakness-at risk for falls. [Patient will fatigue and weakness-at risk for falls.]  Role Documenting the Problem Three  Care Management Telephonic [place] for Problem Three  Active  [facility] Long Term Goal   patient will report no falls in the home over the next 90 days.  [facility] Long Term Goal Start Date  10/25/19  Interventions for Problem Three Long Term Goal  RNCM completed falls/safety eval. RN CM discused fall prevention and use of appropriate DME. RN CM assessed for adequate in home support.  [facility] CM Short Term Goal #2   Patient will complete MD follow up appt within 30 days.  [facility] CM Short Term Goal #2 Start Date  06/02/19  [facility] CM Short Term Goal #2 Met Date  08/23/19      Plan: RN CM discussed with patient next outreach within the month of July. Patient gave verbal consent and in  agreement with RN CM follow up and timeframe. Patient aware that they may contact RN CM sooner for any issues or concerns. RN CM will send quarterly update to PCP.   [name], RN,BSN,CCM [place] Management Telephonic Care Management Coordinator Direct Phone: [phone] Toll Free: [phone] Fax: [phone]

## 2019-10-29 ENCOUNTER — Encounter: Payer: Self-pay | Admitting: Cardiology

## 2019-10-29 DIAGNOSIS — J309 Allergic rhinitis, unspecified: Secondary | ICD-10-CM | POA: Diagnosis not present

## 2019-10-29 DIAGNOSIS — J209 Acute bronchitis, unspecified: Secondary | ICD-10-CM | POA: Diagnosis not present

## 2019-11-04 ENCOUNTER — Ambulatory Visit: Payer: Medicare HMO | Admitting: Cardiology

## 2019-11-09 ENCOUNTER — Other Ambulatory Visit: Payer: Self-pay

## 2019-11-12 ENCOUNTER — Ambulatory Visit: Payer: Medicare HMO | Admitting: Cardiology

## 2019-11-12 ENCOUNTER — Encounter: Payer: Self-pay | Admitting: Cardiology

## 2019-11-12 ENCOUNTER — Other Ambulatory Visit: Payer: Self-pay

## 2019-11-12 VITALS — BP 136/68 | HR 98 | Ht 71.0 in | Wt 165.2 lb

## 2019-11-12 DIAGNOSIS — I5020 Unspecified systolic (congestive) heart failure: Secondary | ICD-10-CM | POA: Diagnosis not present

## 2019-11-12 DIAGNOSIS — I255 Ischemic cardiomyopathy: Secondary | ICD-10-CM | POA: Diagnosis not present

## 2019-11-12 DIAGNOSIS — I10 Essential (primary) hypertension: Secondary | ICD-10-CM

## 2019-11-12 DIAGNOSIS — E782 Mixed hyperlipidemia: Secondary | ICD-10-CM

## 2019-11-12 DIAGNOSIS — I2510 Atherosclerotic heart disease of native coronary artery without angina pectoris: Secondary | ICD-10-CM | POA: Diagnosis not present

## 2019-11-12 DIAGNOSIS — F17200 Nicotine dependence, unspecified, uncomplicated: Secondary | ICD-10-CM

## 2019-11-12 MED ORDER — POTASSIUM CHLORIDE CRYS ER 20 MEQ PO TBCR: 20.0000 meq | EXTENDED_RELEASE_TABLET | Freq: Two times a day (BID) | ORAL | 3 refills | Status: DC

## 2019-11-12 MED ORDER — FUROSEMIDE 40 MG PO TABS: 40.0000 mg | ORAL_TABLET | Freq: Two times a day (BID) | ORAL | 3 refills | Status: DC

## 2019-11-12 NOTE — Progress Notes (Signed)
Cardiology Office Note:    Date:  11/13/2019   ID:  ZAKI GERTSCH, DOB July 10, 1940, MRN [id number]  PCP:  Ernestene Kiel, MD  Cardiologist:  [name], DO  Electrophysiologist:  None   Referring MD: [name], MD   "I am doing well honey"  History of Present Illness:    Richard Epp Conradis a 79 y.o.malewith a hx of mild coronary artery disease, recent cardiac [facility] [place] where he was told that he had no significant blockage,DVT he has recently been placed on Eliquis 5 mg twice daily, hypertension, ischemic cardiomyopathy per documentation his most recent EF was 30%, hyperlipidemia and recent GI bleeding. Patient is also legally blind.  I last saw the patient on September 03, 2019 at that time he appeared to be volume overloaded.  Initially asked the patient to the emergency department but he would prefer to stay home therefore a increase his Lasix.  Blood work came back show significantly elevated BNP I called the patient again request that he go to the emergency department which he did.  He was hospitalized at [facility] for acute on chronic heart failure.  During his hospitalization he underwent significant diuresis, due to anemia his Eliquis was stopped.  His losartan isosorbide nitrate was also started due to borderline hypotension.  I saw him 09/28/2019, at that time her was euvolemic. He tells me that he has had som improvement. He is taking his Lasix as prescribed.  He is here today for follow-up visit he is with his son-in-law [name] he tells me that he has had some shortness of breath.    Past Medical History:  Diagnosis Date  . AAA (abdominal aortic aneurysm) ([place]) 11/2011  . Abdominal aneurysm without mention of rupture 01/06/2012  . Abdominal tightness 01/11/2013  . Allergic rhinitis   . Arthritis   . DVT (deep venous thrombosis) ([place])   . Dysmetabolic syndrome   . Gastritis and gastroduodenitis   . Generalized osteoarthritis    . GERD (gastroesophageal reflux disease)   . GI bleed 04/13/2019  . GIB (gastrointestinal bleeding) 04/13/2019  . Gout   . Headache(784.0)   . HFrEF (heart failure with reduced ejection fraction) ([place]) 08/06/2019  . History of GI bleed 08/06/2019  . Hypercholesterolemia   . Hypertension   . Ischemic cardiomyopathy 08/06/2019  . Lower extremity edema   . Melena 04/13/2019  . Mixed hyperlipidemia 08/06/2019  . Nausea alone 07/13/2013  . 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 Peptic ulcer   . Shortness of breath   . Symptomatic anemia 04/13/2019  . UGI bleed     Past Surgical History:  Procedure Laterality Date  . ABDOMINAL AORTIC ANEURYSM REPAIR  11/28/11   stent   . ABDOMINAL AORTIC ANEURYSM REPAIR    . APPENDECTOMY    . BIOPSY  04/15/2019   Procedure: BIOPSY;  Surgeon: Thornton Park, MD;  Location: [facility];  Service: Gastroenterology;;  . cataracts    . ESOPHAGOGASTRODUODENOSCOPY (EGD) WITH PROPOFOL N/A 04/15/2019   Procedure: ESOPHAGOGASTRODUODENOSCOPY (EGD) WITH PROPOFOL;  Surgeon: Thornton Park, MD;  Location: [facility];  Service: Gastroenterology;  Laterality: N/A;  . EYE SURGERY  2009   Left cataract  . 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 FOOT SURGERY    . IR IVC FILTER PLMT / S&I /IMG GUID/MOD SED  04/14/2019  . KNEE ARTHROSCOPY    . TONSILLECTOMY  Current Medications: Current Meds  Medication Sig  . acetaminophen (TYLENOL) 325 MG tablet Take 650 mg by mouth every 6 (six) hours as needed for mild pain or headache.   . albuterol (VENTOLIN HFA) 108 (90 Base) MCG/ACT inhaler Inhale 2 puffs into the lungs daily as needed.  Marland Kitchen allopurinol (ZYLOPRIM) 300 MG tablet Take 1 tablet by mouth daily.  Marland Kitchen atorvastatin (LIPITOR) 40 MG tablet Take 20 mg by mouth daily. Taking 1/2 tab  . butalbital-acetaminophen-caffeine (FIORICET) 50-325-40 MG tablet   . ferrous sulfate 324 MG TBEC Take 324 mg by mouth daily  with breakfast.  . fexofenadine (ALLEGRA ODT) 30 MG disintegrating tablet Take 30 mg by mouth daily.  . furosemide (LASIX) 40 MG tablet Take 1 tablet (40 mg total) by mouth 2 (two) times daily.  Marland Kitchen gabapentin (NEURONTIN) 300 MG capsule 300 mg daily.  . isosorbide mononitrate (IMDUR) 60 MG 24 hr tablet Take 1 tablet (60 mg total) by mouth daily.  . metoprolol succinate (TOPROL-XL) 25 MG 24 hr tablet TAKE 1/2 TABLET(12.5 MG) BY MOUTH DAILY  . nitroGLYCERIN (NITROSTAT) 0.4 MG SL tablet Place 1 tablet (0.4 mg total) under the tongue every 5 (five) minutes as needed.  . nystatin (MYCOSTATIN) 100000 UNIT/ML suspension   . pantoprazole (PROTONIX) 40 MG tablet Take 40 mg by mouth 2 (two) times daily.  . potassium chloride SA (KLOR-CON) 20 MEQ tablet Take 1 tablet (20 mEq total) by mouth 2 (two) times daily.  . sucralfate (CARAFATE) 1 g tablet Take 1 g by mouth 4 (four) times daily -  with meals and at bedtime.  . SYMBICORT 160-4.5 MCG/ACT inhaler Inhale 2 puffs into the lungs daily as needed.  . [DISCONTINUED] furosemide (LASIX) 40 MG tablet 40 mg 2 (two) times daily.   . [DISCONTINUED] potassium chloride SA (KLOR-CON) 20 MEQ tablet Take 20 mEq by mouth 2 (two) times daily.     Allergies:   Lisinopril   Social History   Socioeconomic History  . Marital status: Married    Spouse name: Not on file  . Number of children: Not on file  . Years of education: Not on file  . Highest education level: Not on file  Occupational History  . Not on file  Tobacco Use  . Smoking status: Current Every Day Smoker    Packs/day: 0.25    Years: 51.00    Pack years: 12.75    Types: Cigarettes  . Smokeless tobacco: Never Used  Substance and Sexual Activity  . Alcohol use: No  . Drug use: No  . Sexual activity: Not on file  Other Topics Concern  . Not on file  Social History Narrative  . Not on file   Social Determinants of Health   Financial Resource Strain:   . Difficulty of Paying Living Expenses:     Food Insecurity: No Food Insecurity  . Worried About Charity fundraiser in the Last Year: Never true  . Ran Out of Food in the Last Year: Never true  Transportation Needs: No Transportation Needs  . Lack of Transportation (Medical): No  . Lack of Transportation (Non-Medical): No  Physical Activity:   . Days of Exercise per Week:   . Minutes of Exercise per Session:   Stress:   . Feeling of Stress :   Social Connections:   . Frequency of Communication with Friends and Family:   . Frequency of Social Gatherings with Friends and Family:   . Attends Religious Services:   . Active  Member of Clubs or Organizations:   . Attends Archivist Meetings:   Marland Kitchen Marital Status:      Family History: The patient's family history includes Cancer in his mother and sister; Heart attack in his maternal grandfather; Hypertension in his brother, daughter, mother, and sister; Leukemia in his mother; Prostate cancer in his father.  ROS:   Review of Systems  Constitution: Negative for decreased appetite, fever and weight gain.  HENT: Negative for congestion, ear discharge, hoarse voice and sore throat.   Eyes: Negative for discharge, redness, vision loss in right eye and visual halos.  Cardiovascular: Negative for chest pain, dyspnea on exertion, leg swelling, orthopnea and palpitations.  Respiratory: Negative for cough, hemoptysis, shortness of breath and snoring.   Endocrine: Negative for heat intolerance and polyphagia.  Hematologic/Lymphatic: Negative for bleeding problem. Does not bruise/bleed easily.  Skin: Negative for flushing, nail changes, rash and suspicious lesions.  Musculoskeletal: Negative for arthritis, joint pain, muscle cramps, myalgias, neck pain and stiffness.  Gastrointestinal: Negative for abdominal pain, bowel incontinence, diarrhea and excessive appetite.  Genitourinary: Negative for decreased libido, genital sores and incomplete emptying.  Neurological: Negative for  brief paralysis, focal weakness, headaches and loss of balance.  Psychiatric/Behavioral: Negative for altered mental status, depression and suicidal ideas.  Allergic/Immunologic: Negative for HIV exposure and persistent infections.    EKGs/Labs/Other Studies Reviewed:    The following studies were reviewed today:   EKG:  None today  Recent Labs: 04/13/2019: ALT 11 04/29/2019: Hemoglobin 8.1; Platelets 480 09/03/2019: BUN 37; Creatinine, Ser 1.94; Magnesium 2.1; NT-Pro BNP 17,585; Potassium 4.0; Sodium 136  Recent Lipid Panel No results found for: CHOL, TRIG, HDL, CHOLHDL, VLDL, LDLCALC, LDLDIRECT  Physical Exam:    VS:  BP 136/68 (BP Location: Right Arm, Patient Position: Sitting, Cuff Size: Normal)   Pulse 98   Ht 5\' 11"  (1.803 m)   Wt 165 lb 3.2 oz (74.9 kg)   SpO2 96%   BMI 23.04 kg/m     Wt Readings from Last 3 Encounters:  11/12/19 165 lb 3.2 oz (74.9 kg)  09/28/19 177 lb (80.3 kg)  09/03/19 189 lb (85.7 kg)     GEN: Well nourished, well developed in no acute distress HEENT: Normal NECK: No JVD; No carotid bruits LYMPHATICS: No lymphadenopathy CARDIAC: S1S2 noted,RRR, no murmurs, rubs, gallops RESPIRATORY:  Clear to auscultation without rales, wheezing or rhonchi  ABDOMEN: Soft, non-tender, non-distended, +bowel sounds, no guarding. EXTREMITIES: No edema, No cyanosis, no clubbing MUSCULOSKELETAL:  No deformity  SKIN: Warm and dry NEUROLOGIC:  Alert and oriented x 3, non-focal PSYCHIATRIC:  Normal affect, good insight  ASSESSMENT:    1. Ischemic cardiomyopathy   2. HFrEF (heart failure with reduced ejection fraction) ([place])   3. Mixed hyperlipidemia   4. Essential hypertension   5. Mild CAD   6. Smoker    PLAN:     Heart failure with reduced ejection fraction-the patient appears to be euvolemic.  We will continue him on his current Lasix dosing. Refills of this medication has been send.  Ischemic cardiomyopathy continue his beta-blocker, it has been  difficult to add medications like Aldactone, and Entresto. He gets significant coughing with ace I and arb.  As he has had hypotension with these medication. He wants to stay off these medication for now.  He still takes his imdur.  Hyperlipidemia continue his Lipitor  CAD - no angina symptoms. Continue with current medication regimen  Smoker - cessation advised  The patient is  in agreement with the above plan. The patient left the office in stable condition.  The patient will follow up in 3 months or sooner if needed.   Medication Adjustments/Labs and Tests Ordered: Current medicines are reviewed at length with the patient today.  Concerns regarding medicines are outlined above.  No orders of the defined types were placed in this encounter.  Meds ordered this encounter  Medications  . furosemide (LASIX) 40 MG tablet    Sig: Take 1 tablet (40 mg total) by mouth 2 (two) times daily.    Dispense:  180 tablet    Refill:  3  . potassium chloride SA (KLOR-CON) 20 MEQ tablet    Sig: Take 1 tablet (20 mEq total) by mouth 2 (two) times daily.    Dispense:  180 tablet    Refill:  3    Patient Instructions  Medication Instructions:  Your physician has recommended you make the following change in your medication:  INCREASE: Potassium Chloride 20 meq take one tablet by mouth daily. *If you need a refill on your cardiac medications before your next appointment, please call your pharmacy*   Lab Work: None If you have labs (blood work) drawn today and your tests are completely normal, you will receive your results only by: Marland Kitchen MyChart Message (if you have MyChart) OR . A paper copy in the mail If you have any lab test that is abnormal or we need to change your treatment, we will call you to review the results.   Testing/Procedures: None   Follow-Up: At [facility], you and your health needs are our priority.  As part of our continuing mission to provide you with exceptional heart  care, we have created designated Provider Care Teams.  These Care Teams include your primary Cardiologist (physician) and Advanced Practice Providers (APPs -  Physician Assistants and Nurse Practitioners) who all work together to provide you with the care you need, when you need it.  We recommend signing up for the patient portal called "MyChart".  Sign up information is provided on this After Visit Summary.  MyChart is used to connect with patients for Virtual Visits (Telemedicine).  Patients are able to view lab/test results, encounter notes, upcoming appointments, etc.  Non-urgent messages can be sent to your provider as well.   To learn more about what you can do with MyChart, go to [email or web address].    Your next appointment:   3 month(s)  The format for your next appointment:   In Person  Provider:   [name], DO   Other Instructions      Adopting a Healthy Lifestyle.  Know what a healthy weight is for you (roughly BMI <25) and aim to maintain this   Aim for 7+ servings of fruits and vegetables daily   65-80+ fluid ounces of water or unsweet tea for healthy kidneys   Limit to max 1 drink of alcohol per day; avoid smoking/tobacco   Limit animal fats in diet for cholesterol and heart health - choose grass fed whenever available   Avoid highly processed foods, and foods high in saturated/trans fats   Aim for low stress - take time to unwind and care for your mental health   Aim for 150 min of moderate intensity exercise weekly for heart health, and weights twice weekly for bone health   Aim for 7-9 hours of sleep daily   When it comes to diets, agreement about the perfect plan isnt easy to find, even among the  experts. Experts at the [employer] developed an idea known as the Healthy Eating Plate. Just imagine a plate divided into logical, healthy portions.   The emphasis is on diet quality:   Load up on vegetables and fruits - one-half of  your plate: Aim for color and variety, and remember that potatoes dont count.   Go for whole grains - one-quarter of your plate: Whole wheat, barley, wheat berries, quinoa, oats, brown rice, and foods made with them. If you want pasta, go with whole wheat pasta.   Protein power - one-quarter of your plate: Fish, chicken, beans, and nuts are all healthy, versatile protein sources. Limit red meat.   The diet, however, does go beyond the plate, offering a few other suggestions.   Use healthy plant oils, such as olive, canola, soy, corn, sunflower and peanut. Check the labels, and avoid partially hydrogenated oil, which have unhealthy trans fats.   If youre thirsty, drink water. Coffee and tea are good in moderation, but skip sugary drinks and limit milk and dairy products to one or two daily servings.   The type of carbohydrate in the diet is more important than the amount. Some sources of carbohydrates, such as vegetables, fruits, whole grains, and beans-are healthier than others.   Finally, stay active  Signed, [name], DO  11/13/2019 10:25 AM    [place]

## 2019-11-12 NOTE — Patient Instructions (Signed)
Medication Instructions:  Your physician has recommended you make the following change in your medication:  INCREASE: Potassium Chloride 20 meq take one tablet by mouth daily. *If you need a refill on your cardiac medications before your next appointment, please call your pharmacy*   Lab Work: None If you have labs (blood work) drawn today and your tests are completely normal, you will receive your results only by: Marland Kitchen MyChart Message (if you have MyChart) OR . A paper copy in the mail If you have any lab test that is abnormal or we need to change your treatment, we will call you to review the results.   Testing/Procedures: None   Follow-Up: At [facility], you and your health needs are our priority.  As part of our continuing mission to provide you with exceptional heart care, we have created designated Provider Care Teams.  These Care Teams include your primary Cardiologist (physician) and Advanced Practice Providers (APPs -  Physician Assistants and Nurse Practitioners) who all work together to provide you with the care you need, when you need it.  We recommend signing up for the patient portal called "MyChart".  Sign up information is provided on this After Visit Summary.  MyChart is used to connect with patients for Virtual Visits (Telemedicine).  Patients are able to view lab/test results, encounter notes, upcoming appointments, etc.  Non-urgent messages can be sent to your provider as well.   To learn more about what you can do with MyChart, go to [email or web address].    Your next appointment:   3 month(s)  The format for your next appointment:   In Person  Provider:   [name], DO   Other Instructions

## 2019-11-13 DIAGNOSIS — I2510 Atherosclerotic heart disease of native coronary artery without angina pectoris: Secondary | ICD-10-CM | POA: Insufficient documentation

## 2019-11-13 DIAGNOSIS — I10 Essential (primary) hypertension: Secondary | ICD-10-CM | POA: Insufficient documentation

## 2019-11-17 ENCOUNTER — Other Ambulatory Visit: Payer: Self-pay | Admitting: Interventional Radiology

## 2019-11-17 DIAGNOSIS — Z95828 Presence of other vascular implants and grafts: Secondary | ICD-10-CM

## 2019-11-22 ENCOUNTER — Telehealth: Payer: Self-pay

## 2019-11-22 NOTE — Telephone Encounter (Signed)
Pt contacted ofc stating that he was not feeling well and wanted to cx appt, pt stated that he does not wish to r/s was under the impression that the IVC filter would be permanent; pt stated that he is not interested in having it removed.

## 2019-11-24 ENCOUNTER — Other Ambulatory Visit: Payer: Medicare HMO

## 2019-12-15 DIAGNOSIS — R062 Wheezing: Secondary | ICD-10-CM | POA: Diagnosis not present

## 2019-12-15 DIAGNOSIS — J449 Chronic obstructive pulmonary disease, unspecified: Secondary | ICD-10-CM | POA: Diagnosis not present

## 2019-12-15 DIAGNOSIS — Z8719 Personal history of other diseases of the digestive system: Secondary | ICD-10-CM | POA: Diagnosis not present

## 2019-12-15 DIAGNOSIS — N62 Hypertrophy of breast: Secondary | ICD-10-CM | POA: Diagnosis not present

## 2019-12-15 DIAGNOSIS — Z6824 Body mass index (BMI) 24.0-24.9, adult: Secondary | ICD-10-CM | POA: Diagnosis not present

## 2019-12-15 DIAGNOSIS — Z79899 Other long term (current) drug therapy: Secondary | ICD-10-CM | POA: Diagnosis not present

## 2019-12-15 DIAGNOSIS — I5022 Chronic systolic (congestive) heart failure: Secondary | ICD-10-CM | POA: Diagnosis not present

## 2019-12-15 DIAGNOSIS — T50905A Adverse effect of unspecified drugs, medicaments and biological substances, initial encounter: Secondary | ICD-10-CM | POA: Diagnosis not present

## 2019-12-29 ENCOUNTER — Other Ambulatory Visit: Payer: Self-pay

## 2019-12-29 ENCOUNTER — Telehealth (HOSPITAL_COMMUNITY): Payer: Self-pay | Admitting: Interventional Radiology

## 2019-12-29 NOTE — Progress Notes (Signed)
Patient ID: Richard Townsend, male   DOB: 16-Apr-1941, 79 y.o.   MRN: [id number]  The patient was contacted by our office staff to set up appointment for consultation regarding possible IVC filter retrieval.  Per staff, patient stated that he intended for the IVC filter to be permanent, and therefore declined to schedule consultation.  We will be happy to see the patient back in the future as desired for consultation regarding filter retrieval, if caval filtration is no longer indicated.

## 2019-12-29 NOTE — Patient Outreach (Signed)
[facility]) Care Management  12/29/2019  STEFAN MARKARIAN July 17, 1940 [id number]   Telephone Assessment    Outreach attempt to patient. No answer after multiple rings.     Plan: RN CM will make outreach attempt to patient within the month of Sept.    Nyxon Strupp Verl Blalock [facility] Management Telephonic Care Management Coordinator Direct Phone: [phone] Toll Free: [phone] Fax: [phone]

## 2020-01-07 DIAGNOSIS — E785 Hyperlipidemia, unspecified: Secondary | ICD-10-CM | POA: Diagnosis not present

## 2020-01-07 DIAGNOSIS — Z79899 Other long term (current) drug therapy: Secondary | ICD-10-CM | POA: Diagnosis not present

## 2020-01-07 DIAGNOSIS — D509 Iron deficiency anemia, unspecified: Secondary | ICD-10-CM | POA: Diagnosis not present

## 2020-01-07 DIAGNOSIS — M109 Gout, unspecified: Secondary | ICD-10-CM | POA: Diagnosis not present

## 2020-02-14 ENCOUNTER — Other Ambulatory Visit: Payer: Self-pay | Admitting: Cardiology

## 2020-02-22 ENCOUNTER — Ambulatory Visit: Payer: Medicare HMO | Admitting: Cardiology

## 2020-02-22 ENCOUNTER — Encounter: Payer: Self-pay | Admitting: Cardiology

## 2020-02-22 ENCOUNTER — Other Ambulatory Visit: Payer: Self-pay

## 2020-02-22 VITALS — BP 120/70 | HR 77 | Ht 71.0 in | Wt 160.0 lb

## 2020-02-22 DIAGNOSIS — Z79899 Other long term (current) drug therapy: Secondary | ICD-10-CM

## 2020-02-22 DIAGNOSIS — I255 Ischemic cardiomyopathy: Secondary | ICD-10-CM | POA: Diagnosis not present

## 2020-02-22 DIAGNOSIS — E782 Mixed hyperlipidemia: Secondary | ICD-10-CM | POA: Diagnosis not present

## 2020-02-22 DIAGNOSIS — I5020 Unspecified systolic (congestive) heart failure: Secondary | ICD-10-CM

## 2020-02-22 DIAGNOSIS — I2510 Atherosclerotic heart disease of native coronary artery without angina pectoris: Secondary | ICD-10-CM | POA: Diagnosis not present

## 2020-02-22 DIAGNOSIS — I10 Essential (primary) hypertension: Secondary | ICD-10-CM | POA: Diagnosis not present

## 2020-02-22 NOTE — Progress Notes (Signed)
Cardiology Office Note:    Date:  02/22/2020   ID:  Richard Townsend, DOB 16-Mar-1941, MRN [id number]  PCP:  Ernestene Kiel, MD  Cardiologist:  [name], DO  Electrophysiologist:  None   Referring MD: Ernestene Kiel, MD   " I lost my wife"   History of Present Illness:    Richard Townsend a 79 y.o.malewith a hx of mild coronary artery disease, recent cardiac [facility] [place] where he was told that he had no significant blockage,DVT he has recently been placed on Eliquis 5 mg twice daily, hypertension, ischemic cardiomyopathy per documentation his most recent EF was 30%, hyperlipidemia and recent GI bleeding. Patient is also legally blind.  I last saw the patient on September 03, 2019 at that time he appeared to be volume overloaded. Initially asked the patient to the emergency department but he would prefer to stay home therefore a increase his Lasix. Blood work came back show significantly elevated BNP I called the patient again request that he go to the emergency department which he did. He was hospitalized at [facility] for acute on chronic heart failure. During his hospitalization he underwent significant diuresis, due to anemia his Eliquis was stopped. His losartan isosorbide nitrate was also started due to borderline hypotension.  I saw the patient on November 12, 2019 at that time he appears to be euvolemic and continue his current medication doses.  Fortunately since I last saw the patient he has lost his wife. He says that he is doing fine.   He is here today for follow-up visit he is with his son-in-law [name] he tells me that he has had some intermittent chest pain which he attributes to stress but nothing more.  Past Medical History:  Diagnosis Date  . AAA (abdominal aortic aneurysm) ([place]) 11/2011  . Abdominal aneurysm without mention of rupture 01/06/2012  . Abdominal tightness 01/11/2013  . Allergic rhinitis   . Arthritis   .  DVT (deep venous thrombosis) ([place])   . Dysmetabolic syndrome   . Gastritis and gastroduodenitis   . Generalized osteoarthritis   . GERD (gastroesophageal reflux disease)   . GI bleed 04/13/2019  . GIB (gastrointestinal bleeding) 04/13/2019  . Gout   . Headache(784.0)   . HFrEF (heart failure with reduced ejection fraction) ([place]) 08/06/2019  . History of GI bleed 08/06/2019  . Hypercholesterolemia   . Hypertension   . Ischemic cardiomyopathy 08/06/2019  . Lower extremity edema   . Melena 04/13/2019  . Mixed hyperlipidemia 08/06/2019  . Nausea alone 07/13/2013  . 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 Peptic ulcer   . Shortness of breath   . Symptomatic anemia 04/13/2019  . UGI bleed     Past Surgical History:  Procedure Laterality Date  . ABDOMINAL AORTIC ANEURYSM REPAIR  11/28/11   stent   . ABDOMINAL AORTIC ANEURYSM REPAIR    . APPENDECTOMY    . BIOPSY  04/15/2019   Procedure: BIOPSY;  Surgeon: Thornton Park, MD;  Location: [facility];  Service: Gastroenterology;;  . cataracts    . ESOPHAGOGASTRODUODENOSCOPY (EGD) WITH PROPOFOL N/A 04/15/2019   Procedure: ESOPHAGOGASTRODUODENOSCOPY (EGD) WITH PROPOFOL;  Surgeon: Thornton Park, MD;  Location: [facility];  Service: Gastroenterology;  Laterality: N/A;  . EYE SURGERY  2009   Left cataract  . 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 FOOT SURGERY    . IR IVC FILTER PLMT / S&I /  IMG GUID/MOD SED  04/14/2019  . KNEE ARTHROSCOPY    . TONSILLECTOMY      Current Medications: Current Meds  Medication Sig  . acetaminophen (TYLENOL) 325 MG tablet Take 650 mg by mouth every 6 (six) hours as needed for mild pain or headache.   . albuterol (VENTOLIN HFA) 108 (90 Base) MCG/ACT inhaler Inhale 2 puffs into the lungs daily as needed.  Marland Kitchen allopurinol (ZYLOPRIM) 300 MG tablet Take 1 tablet by mouth daily.  Marland Kitchen atorvastatin (LIPITOR) 40 MG tablet Take 20 mg by mouth daily. Taking 1/2  tab  . beta carotene w/minerals (OCUVITE) tablet Take 1 tablet by mouth daily.  . butalbital-acetaminophen-caffeine (FIORICET) 50-325-40 MG tablet 1 tablet as needed.   . famotidine (PEPCID) 40 MG tablet daily.  . ferrous sulfate 324 MG TBEC Take 324 mg by mouth daily with breakfast.  . fexofenadine (ALLEGRA ODT) 30 MG disintegrating tablet Take 30 mg by mouth daily.  . furosemide (LASIX) 40 MG tablet Take 1 tablet (40 mg total) by mouth 2 (two) times daily.  Marland Kitchen gabapentin (NEURONTIN) 300 MG capsule 300 mg daily.  . isosorbide mononitrate (IMDUR) 60 MG 24 hr tablet TAKE 1 TABLET(60 MG) BY MOUTH DAILY  . metoprolol succinate (TOPROL-XL) 25 MG 24 hr tablet TAKE 1/2 TABLET(12.5 MG) BY MOUTH DAILY  . pantoprazole (PROTONIX) 40 MG tablet Take 40 mg by mouth 2 (two) times daily.  . potassium chloride SA (KLOR-CON) 20 MEQ tablet Take 1 tablet (20 mEq total) by mouth 2 (two) times daily.  . sucralfate (CARAFATE) 1 g tablet Take 1 g by mouth 4 (four) times daily -  with meals and at bedtime.  . SYMBICORT 160-4.5 MCG/ACT inhaler Inhale 2 puffs into the lungs daily as needed.     Allergies:   Lisinopril   Social History   Socioeconomic History  . Marital status: Married    Spouse name: Not on file  . Number of children: Not on file  . Years of education: Not on file  . Highest education level: Not on file  Occupational History  . Not on file  Tobacco Use  . Smoking status: Current Every Day Smoker    Packs/day: 0.25    Years: 51.00    Pack years: 12.75    Types: Cigarettes  . Smokeless tobacco: Never Used  Vaping Use  . Vaping Use: Never used  Substance and Sexual Activity  . Alcohol use: No  . Drug use: No  . Sexual activity: Not on file  Other Topics Concern  . Not on file  Social History Narrative  . Not on file   Social Determinants of Health   Financial Resource Strain:   . Difficulty of Paying Living Expenses: Not on file  Food Insecurity: No Food Insecurity  . Worried  About Charity fundraiser in the Last Year: Never true  . Ran Out of Food in the Last Year: Never true  Transportation Needs: No Transportation Needs  . Lack of Transportation (Medical): No  . Lack of Transportation (Non-Medical): No  Physical Activity:   . Days of Exercise per Week: Not on file  . Minutes of Exercise per Session: Not on file  Stress:   . Feeling of Stress : Not on file  Social Connections:   . Frequency of Communication with Friends and Family: Not on file  . Frequency of Social Gatherings with Friends and Family: Not on file  . Attends Religious Services: Not on file  . Active Member  of Clubs or Organizations: Not on file  . Attends Archivist Meetings: Not on file  . Marital Status: Not on file     Family History: The patient's family history includes Cancer in his mother and sister; Heart attack in his maternal grandfather; Hypertension in his brother, daughter, mother, and sister; Leukemia in his mother; Prostate cancer in his father.  ROS:   Review of Systems  Constitution: Negative for decreased appetite, fever and weight gain.  HENT: Negative for congestion, ear discharge, hoarse voice and sore throat.   Eyes: Negative for discharge, redness, vision loss in right eye and visual halos.  Cardiovascular: Negative for chest pain, dyspnea on exertion, leg swelling, orthopnea and palpitations.  Respiratory: Negative for cough, hemoptysis, shortness of breath and snoring.   Endocrine: Negative for heat intolerance and polyphagia.  Hematologic/Lymphatic: Negative for bleeding problem. Does not bruise/bleed easily.  Skin: Negative for flushing, nail changes, rash and suspicious lesions.  Musculoskeletal: Negative for arthritis, joint pain, muscle cramps, myalgias, neck pain and stiffness.  Gastrointestinal: Negative for abdominal pain, bowel incontinence, diarrhea and excessive appetite.  Genitourinary: Negative for decreased libido, genital sores and  incomplete emptying.  Neurological: Negative for brief paralysis, focal weakness, headaches and loss of balance.  Psychiatric/Behavioral: Negative for altered mental status, depression and suicidal ideas.  Allergic/Immunologic: Negative for HIV exposure and persistent infections.    EKGs/Labs/Other Studies Reviewed:    The following studies were reviewed today:   EKG: None today  Recent Labs: 04/13/2019: ALT 11 04/29/2019: Hemoglobin 8.1; Platelets 480 09/03/2019: BUN 37; Creatinine, Ser 1.94; Magnesium 2.1; NT-Pro BNP 17,585; Potassium 4.0; Sodium 136  Recent Lipid Panel No results found for: CHOL, TRIG, HDL, CHOLHDL, VLDL, LDLCALC, LDLDIRECT  Physical Exam:    VS:  BP 120/70   Pulse 77   Ht 5\' 11"  (1.803 m)   Wt 160 lb (72.6 kg)   SpO2 97%   BMI 22.32 kg/m     Wt Readings from Last 3 Encounters:  02/22/20 160 lb (72.6 kg)  11/12/19 165 lb 3.2 oz (74.9 kg)  09/28/19 177 lb (80.3 kg)     GEN: Well nourished, well developed in no acute distress HEENT: Normal NECK: No JVD; No carotid bruits LYMPHATICS: No lymphadenopathy CARDIAC: S1S2 noted,RRR, no murmurs, rubs, gallops RESPIRATORY:  Clear to auscultation without rales, wheezing or rhonchi  ABDOMEN: Soft, non-tender, non-distended, +bowel sounds, no guarding. EXTREMITIES: No edema, No cyanosis, no clubbing MUSCULOSKELETAL:  No deformity  SKIN: Warm and dry NEUROLOGIC:  Alert and oriented x 3, non-focal PSYCHIATRIC:  Normal affect, good insight  ASSESSMENT:    1. Medication management   2. Ischemic cardiomyopathy   3. HFrEF (heart failure with reduced ejection fraction) ([place])   4. Essential hypertension   5. Mild CAD   6. Mixed hyperlipidemia    PLAN:     1.He is doing well from a cardiovascular standpoint.  No changes will be made to his medications today.  However like to do blood work for BMP and magnesium to understand his electrolytes and kidney function.  No anginal symptoms continue patient's current  medication regimen.  Hyperlipidemia continue patient on his atorvastatin 40 mg daily.  The patient is in agreement with the above plan. The patient left the office in stable condition.  The patient will follow up in 3 months or sooner if needed.  He requires close follow-up due to low threshold for CHF exacerbation.   Medication Adjustments/Labs and Tests Ordered: Current medicines are reviewed at  length with the patient today.  Concerns regarding medicines are outlined above.  Orders Placed This Encounter  Procedures  . Basic metabolic panel  . Magnesium   No orders of the defined types were placed in this encounter.   Patient Instructions  Medication Instructions:  Your physician recommends that you continue on your current medications as directed. Please refer to the Current Medication list given to you today.  *If you need a refill on your cardiac medications before your next appointment, please call your pharmacy*   Lab Work: Your physician recommends that you return for lab work today: bmp, mg   If you have labs (blood work) drawn today and your tests are completely normal, you will receive your results only by: Marland Kitchen MyChart Message (if you have MyChart) OR . A paper copy in the mail If you have any lab test that is abnormal or we need to change your treatment, we will call you to review the results.   Testing/Procedures: None.   Follow-Up: At [facility], you and your health needs are our priority.  As part of our continuing mission to provide you with exceptional heart care, we have created designated Provider Care Teams.  These Care Teams include your primary Cardiologist (physician) and Advanced Practice Providers (APPs -  Physician Assistants and Nurse Practitioners) who all work together to provide you with the care you need, when you need it.  We recommend signing up for the patient portal called "MyChart".  Sign up information is provided on this After Visit  Summary.  MyChart is used to connect with patients for Virtual Visits (Telemedicine).  Patients are able to view lab/test results, encounter notes, upcoming appointments, etc.  Non-urgent messages can be sent to your provider as well.   To learn more about what you can do with MyChart, go to [email or web address].    Your next appointment:   3 month(s)  The format for your next appointment:   In Person  Provider:   [name], DO   Other Instructions      Adopting a Healthy Lifestyle.  Know what a healthy weight is for you (roughly BMI <25) and aim to maintain this   Aim for 7+ servings of fruits and vegetables daily   65-80+ fluid ounces of water or unsweet tea for healthy kidneys   Limit to max 1 drink of alcohol per day; avoid smoking/tobacco   Limit animal fats in diet for cholesterol and heart health - choose grass fed whenever available   Avoid highly processed foods, and foods high in saturated/trans fats   Aim for low stress - take time to unwind and care for your mental health   Aim for 150 min of moderate intensity exercise weekly for heart health, and weights twice weekly for bone health   Aim for 7-9 hours of sleep daily   When it comes to diets, agreement about the perfect plan isnt easy to find, even among the experts. Experts at the [employer] developed an idea known as the Healthy Eating Plate. Just imagine a plate divided into logical, healthy portions.   The emphasis is on diet quality:   Load up on vegetables and fruits - one-half of your plate: Aim for color and variety, and remember that potatoes dont count.   Go for whole grains - one-quarter of your plate: Whole wheat, barley, wheat berries, quinoa, oats, brown rice, and foods made with them. If you want pasta, go with whole wheat  pasta.   Protein power - one-quarter of your plate: Fish, chicken, beans, and nuts are all healthy, versatile protein sources. Limit red  meat.   The diet, however, does go beyond the plate, offering a few other suggestions.   Use healthy plant oils, such as olive, canola, soy, corn, sunflower and peanut. Check the labels, and avoid partially hydrogenated oil, which have unhealthy trans fats.   If youre thirsty, drink water. Coffee and tea are good in moderation, but skip sugary drinks and limit milk and dairy products to one or two daily servings.   The type of carbohydrate in the diet is more important than the amount. Some sources of carbohydrates, such as vegetables, fruits, whole grains, and beans-are healthier than others.   Finally, stay active  Signed, [name], DO  02/22/2020 1:57 PM    [facility]

## 2020-02-22 NOTE — Patient Instructions (Signed)
Medication Instructions:  °Your physician recommends that you continue on your current medications as directed. Please refer to the Current Medication list given to you today. ° °*If you need a refill on your cardiac medications before your next appointment, please call your pharmacy* ° ° °Lab Work: °Your physician recommends that you return for lab work today: bmp, mg  °If you have labs (blood work) drawn today and your tests are completely normal, you will receive your results only by: °• MyChart Message (if you have MyChart) OR °• A paper copy in the mail °If you have any lab test that is abnormal or we need to change your treatment, we will call you to review the results. ° ° °Testing/Procedures: °None ° ° °Follow-Up: °At [facility], you and your health needs are our priority.  As part of our continuing mission to provide you with exceptional heart care, we have created designated Provider Care Teams.  These Care Teams include your primary Cardiologist (physician) and Advanced Practice Providers (APPs -  Physician Assistants and Nurse Practitioners) who all work together to provide you with the care you need, when you need it. ° °We recommend signing up for the patient portal called "MyChart".  Sign up information is provided on this After Visit Summary.  MyChart is used to connect with patients for Virtual Visits (Telemedicine).  Patients are able to view lab/test results, encounter notes, upcoming appointments, etc.  Non-urgent messages can be sent to your provider as well.   °To learn more about what you can do with MyChart, go to [email or web address].   ° °Your next appointment:   °3 month(s) ° °The format for your next appointment:   °In Person ° °Provider:   °Kardie Tobb, DO ° ° °Other Instructions ° ° ° °

## 2020-02-23 ENCOUNTER — Telehealth: Payer: Self-pay

## 2020-02-23 LAB — BASIC METABOLIC PANEL
BUN/Creatinine Ratio: 21 (ref 10–24)
BUN: 25 mg/dL (ref 8–27)
CO2: 23 mmol/L (ref 20–29)
Calcium: 9.1 mg/dL (ref 8.6–10.2)
Chloride: 101 mmol/L (ref 96–106)
Creatinine, Ser: 1.18 mg/dL (ref 0.76–1.27)
GFR calc Af Amer: 68 mL/min/{1.73_m2} (ref >59)
GFR calc non Af Amer: 59 mL/min/{1.73_m2} — ABNORMAL LOW (ref >59)
Glucose: 91 mg/dL (ref 65–99)
Potassium: 5.2 mmol/L (ref 3.5–5.2)
Sodium: 139 mmol/L (ref 134–144)

## 2020-02-23 LAB — MAGNESIUM: Magnesium: 2 mg/dL (ref 1.6–2.3)

## 2020-02-23 NOTE — Telephone Encounter (Signed)
-----   Message from [name], DO sent at 02/23/2020 12:54 PM EDT ----- Please call the patient or his son-in-law [name] to give the lab report.  Great improvement from his last blood work his creatinine has improved significantly.  Electrolytes are all good.

## 2020-02-23 NOTE — Telephone Encounter (Signed)
Spoke with patient regarding results and recommendation.  Patient verbalizes understanding and is agreeable to plan of care. Advised patient to call back with any issues or concerns.  

## 2020-03-01 ENCOUNTER — Other Ambulatory Visit: Payer: Self-pay

## 2020-03-01 NOTE — Patient Outreach (Signed)
[facility]) Care Management  03/01/2020  Richard Townsend December 24, 1940 [id number]   Telephone Assessment Quarterly Call    Outreach attempt #1 to patient. Spoke with patient who shares he has been going through a rough time. He reports that his spouse passed away last month. He was married for 46 yrs and feels so lost without her. Condolences and emotional support given. Spouse was on hospice services prior to death. RN CM discussed with patient grief/bereavement counseling provided by hospice free of charge and encouraged patient to take advantage of it. He voices that his daughter and son in law continue to be extremely supportive and available to assist him. He reports that he went to cardiology appt recently and got good report. His lab work was great and he was told he was doing well. Patient reports he continue to take Lasix and edema has been minimal to none. He has had some wgt loss due to grieving and not eating as much. Patient voices that he got COVID-19(Pfizer) vaccine unable to recall exact dates but states back I the summer(June/July). RN CM discussed with patient importance of getting flu vaccine as well and he will think about it. He denies any RN CM needs or concerns at this time.     Medications Reviewed Today    Reviewed by Hayden Pedro, RN (Registered Nurse) on 03/01/20 at [place] List Status: <None>  Medication Order Taking? Sig Documenting Provider Last Dose Status Informant  acetaminophen (TYLENOL) 325 MG tablet [id number] No Take 650 mg by mouth every 6 (six) hours as needed for mild pain or headache.  [provider] Taking Active Family Member  albuterol (VENTOLIN HFA) 108 (90 Base) MCG/ACT inhaler [id number] No Inhale 2 puffs into the lungs daily as needed. [provider] Taking Active   allopurinol (ZYLOPRIM) 300 MG tablet [id number] No Take 1 tablet by mouth daily. [provider] Taking Active Family Member    atorvastatin (LIPITOR) 40 MG tablet [id number] No Take 20 mg by mouth daily. Taking 1/2 tab [provider] Taking Active Family Member  beta carotene w/minerals (OCUVITE) tablet [id number] No Take 1 tablet by mouth daily. [provider] Taking Active Family Member  butalbital-acetaminophen-caffeine (FIORICET) 50-325-40 MG tablet [id number] No 1 tablet as needed.  [provider] Taking Active   famotidine (PEPCID) 40 MG tablet [id number] No daily. [provider] Taking Active   ferrous sulfate 324 MG TBEC [id number] No Take 324 mg by mouth daily with breakfast. [provider] Taking Active   fexofenadine (ALLEGRA ODT) 30 MG disintegrating tablet [id number] No Take 30 mg by mouth daily. [provider] Taking Active   furosemide (LASIX) 40 MG tablet [id number] No Take 1 tablet (40 mg total) by mouth 2 (two) times daily. Tobb, Godfrey Pick, DO Taking Active   gabapentin (NEURONTIN) 300 MG capsule [id number] No 300 mg daily. [provider] Taking Active   isosorbide mononitrate (IMDUR) 60 MG 24 hr tablet [id number] No TAKE 1 TABLET(60 MG) BY MOUTH DAILY Tobb, Kardie, DO Taking Active   metoprolol succinate (TOPROL-XL) 25 MG 24 hr tablet [id number] No TAKE 1/2 TABLET(12.5 MG) BY MOUTH DAILY Tobb, Kardie, DO Taking Active   nitroGLYCERIN (NITROSTAT) 0.4 MG SL tablet [id number] No Place 1 tablet (0.4 mg total) under the tongue every 5 (five) minutes as needed. Tobb, Kardie, DO Taking Expired 11/18/19 2359   pantoprazole (PROTONIX) 40 MG tablet [id number] No Take 40 mg by mouth 2 (two) times daily. [provider] Taking Active Family Member  potassium chloride SA (KLOR-CON) 20 MEQ tablet [id number] No Take 1 tablet (20 mEq total) by mouth 2 (two) times daily. [name], DO Taking Active   sucralfate (CARAFATE) 1 g tablet [id number] No Take 1 g by mouth 4 (four) times daily -  with meals and at bedtime. [provider] Taking Active Family  Member  SYMBICORT 160-4.5 MCG/ACT inhaler [id number] No Inhale 2 puffs into the lungs daily as needed. [provider] Taking Active            [facility] CM Care Plan Problem One     Most Recent Value  Care Plan Problem One Health maintenance-pt needs COVID-19 vaccine  Role Documenting the Problem One Care Management Telephonic Coordinator  Care Plan for Problem One Active  [facility] Long Term Goal  Patient will report obtaining  flu vaccine within the next 90 days.  [facility] Long Term Goal Start Date 03/01/20  [facility] Long Term Goal Met Date 03/01/20    [facility] CM Care Plan Problem Two     Most Recent Value  Care Plan Problem Two Knowledge deficit related to disease process and mgmt of HTN  Role Documenting the Problem Two Care Management Telephonic Coordinator  Care Plan for Problem Two Active  [facility] Long Term Goal Patient will monitor wgt and BP in the home and report no abnormal values over the next 90 days.  [facility] Long Term Goal Start Date 10/25/19  [facility] Long Term Goal Met Date 03/01/20    [facility] CM Care Plan Problem Three     Most Recent Value  Care Plan Problem Three Grief over recent death of spouse  [Patient will fatigue and weakness-at risk for falls.]  Role Documenting the Problem Three Care Management Telephonic Coordinator  Care Plan for Problem Three Active  [facility] Long Term Goal  Patient will seek grief/beravement counseling over the next 90 days.  [facility] Long Term Goal Start Date 03/01/20  [facility] Long Term Goal Met Date 03/01/20  Interventions for Problem Three Long Term Goal RNCM provided emotional support to pt, discussed normal grief proess, provided patient with education on free support services available to him    Plan: RN CM discussed with patient next outreach within the month of  Dec. Patient gave verbal consent and in agreement with RN CM follow up and timeframe. Patient aware that they may contact RN CM sooner for any issues or concerns.  RN CM will send quarterly update to  PCP.  [name], RN,BSN,CCM [place] [facility] Telephonic Care Management Coordinator Direct Phone: [phone] Toll Free: [phone] Fax: [phone]

## 2020-03-16 DIAGNOSIS — Z1152 Encounter for screening for COVID-19: Secondary | ICD-10-CM | POA: Diagnosis not present

## 2020-03-16 DIAGNOSIS — J302 Other seasonal allergic rhinitis: Secondary | ICD-10-CM | POA: Diagnosis not present

## 2020-03-16 DIAGNOSIS — R062 Wheezing: Secondary | ICD-10-CM | POA: Diagnosis not present

## 2020-04-20 DIAGNOSIS — E785 Hyperlipidemia, unspecified: Secondary | ICD-10-CM | POA: Diagnosis not present

## 2020-04-20 DIAGNOSIS — Z79899 Other long term (current) drug therapy: Secondary | ICD-10-CM | POA: Diagnosis not present

## 2020-04-20 DIAGNOSIS — I739 Peripheral vascular disease, unspecified: Secondary | ICD-10-CM | POA: Diagnosis not present

## 2020-04-24 ENCOUNTER — Telehealth: Payer: Self-pay | Admitting: Cardiology

## 2020-04-24 NOTE — Telephone Encounter (Signed)
Left message on patients voicemail to please return our call.   

## 2020-04-24 NOTE — Telephone Encounter (Signed)
Pt went to his PCP last week because his toes were truning black. The PCP thinks the heart is not pumping correctly and there are some circulation issues. The Son-in-Law wanted to know if he needed to be seen sooner. Please advise

## 2020-04-24 NOTE — Telephone Encounter (Signed)
See if we can add him into my schedule on Wednesday.  Thank you

## 2020-04-25 ENCOUNTER — Other Ambulatory Visit: Payer: Self-pay

## 2020-04-25 NOTE — Patient Outreach (Signed)
[facility]) Care Management  04/25/2020  CHIDI SHIRER August 31, 1940 [id number]   Telephone Assessment   Unsuccessful outreach attempt to patient.      Plan: RN CM will make outreach attempt to patient within the month of  Dec.    Santanna Olenik Verl [place] [employer] Management Telephonic Care Management Coordinator Direct Phone: [phone] Toll Free: [phone] Fax: [phone]

## 2020-04-26 ENCOUNTER — Encounter: Payer: Self-pay | Admitting: Cardiology

## 2020-04-26 ENCOUNTER — Other Ambulatory Visit: Payer: Self-pay

## 2020-04-26 ENCOUNTER — Ambulatory Visit: Payer: Medicare HMO | Admitting: Cardiology

## 2020-04-26 VITALS — BP 146/82 | HR 82 | Ht 68.0 in | Wt 173.4 lb

## 2020-04-26 DIAGNOSIS — I998 Other disorder of circulatory system: Secondary | ICD-10-CM | POA: Diagnosis not present

## 2020-04-26 DIAGNOSIS — I10 Essential (primary) hypertension: Secondary | ICD-10-CM | POA: Diagnosis not present

## 2020-04-26 DIAGNOSIS — I5020 Unspecified systolic (congestive) heart failure: Secondary | ICD-10-CM | POA: Diagnosis not present

## 2020-04-26 DIAGNOSIS — Z79899 Other long term (current) drug therapy: Secondary | ICD-10-CM

## 2020-04-26 DIAGNOSIS — Z8719 Personal history of other diseases of the digestive system: Secondary | ICD-10-CM

## 2020-04-26 DIAGNOSIS — I255 Ischemic cardiomyopathy: Secondary | ICD-10-CM

## 2020-04-26 DIAGNOSIS — E782 Mixed hyperlipidemia: Secondary | ICD-10-CM | POA: Diagnosis not present

## 2020-04-26 DIAGNOSIS — L97529 Non-pressure chronic ulcer of other part of left foot with unspecified severity: Secondary | ICD-10-CM | POA: Diagnosis not present

## 2020-04-26 DIAGNOSIS — L97519 Non-pressure chronic ulcer of other part of right foot with unspecified severity: Secondary | ICD-10-CM | POA: Diagnosis not present

## 2020-04-26 MED ORDER — METOLAZONE 5 MG PO TABS: ORAL_TABLET | ORAL | 0 refills | Status: DC

## 2020-04-26 NOTE — Addendum Note (Signed)
Addended by: Stanton Kidney on: 04/26/2020 02:15 PM   Modules accepted: Orders

## 2020-04-26 NOTE — Progress Notes (Signed)
Cardiology Office Note:    Date:  04/26/2020   ID:  Richard Townsend, DOB 1941-04-09, MRN [id number]  PCP:  Ernestene Kiel, MD  Cardiologist:  [name], DO  Electrophysiologist:  None   Referring MD: Ernestene Kiel, MD     History of Present Illness:    Richard Townsend a 79 y.o.malewith a hx of mild coronary artery disease, recent cardiac catheter[facility] [place] where he was told that he had no significant blockage,DVT he has recently been placed on Eliquis 5 mg twice daily, hypertension, ischemic cardiomyopathy per documentation his most recent EF was 30%, hyperlipidemia and recent GI bleeding. Patient is also legally blind.  I last saw the patient on September 03, 2019 at that time he appeared to be volume overloaded. Initially asked the patient to the emergency department but he would prefer to stay home therefore a increase his Lasix. Blood work came back show significantly elevated BNP I called the patient again request that he go to the emergency department which he did. He was hospitalized at [facility] for acute on chronic heart failure. During his hospitalization he underwent significant diuresis, due to anemia his Eliquis was stopped. His losartan isosorbide nitrate was also started due to borderline hypotension.  I saw the patient on November 12, 2019 at that time he appears to be euvolemic and continue his current medication doses.    He was seen in 02/2020 at that time he was mourning the loss of his wife.  At that time no medication changes were made as he was at his baseline.  I received a call from the patient's son-in-law requesting a sooner follow-up visit given the fact that he was told by his PCP that his toes appeared to be blue/black.  Today he is here and tells me that he has had some shortness of breath and worsening leg edema.   Past Medical History:  Diagnosis Date  . AAA (abdominal aortic aneurysm) ([place]) 11/2011  .  Abdominal aneurysm without mention of rupture 01/06/2012  . Abdominal tightness 01/11/2013  . Allergic rhinitis   . Arthritis   . DVT (deep venous thrombosis) ([place])   . Dysmetabolic syndrome   . Gastritis and gastroduodenitis   . Generalized osteoarthritis   . GERD (gastroesophageal reflux disease)   . GI bleed 04/13/2019  . GIB (gastrointestinal bleeding) 04/13/2019  . Gout   . Headache(784.0)   . HFrEF (heart failure with reduced ejection fraction) ([place]) 08/06/2019  . History of GI bleed 08/06/2019  . Hypercholesterolemia   . Hypertension   . Ischemic cardiomyopathy 08/06/2019  . Lower extremity edema   . Melena 04/13/2019  . Mixed hyperlipidemia 08/06/2019  . Nausea alone 07/13/2013  . 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 Peptic ulcer   . Shortness of breath   . Symptomatic anemia 04/13/2019  . UGI bleed     Past Surgical History:  Procedure Laterality Date  . ABDOMINAL AORTIC ANEURYSM REPAIR  11/28/11   stent   . ABDOMINAL AORTIC ANEURYSM REPAIR    . APPENDECTOMY    . BIOPSY  04/15/2019   Procedure: BIOPSY;  Surgeon: Thornton Park, MD;  Location: [facility];  Service: Gastroenterology;;  . cataracts    . ESOPHAGOGASTRODUODENOSCOPY (EGD) WITH PROPOFOL N/A 04/15/2019   Procedure: ESOPHAGOGASTRODUODENOSCOPY (EGD) WITH PROPOFOL;  Surgeon: Thornton Park, MD;  Location: [facility];  Service: Gastroenterology;  Laterality: N/A;  . EYE SURGERY  2009  Left cataract  . 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 FOOT SURGERY    . IR IVC FILTER PLMT / S&I /IMG GUID/MOD SED  04/14/2019  . KNEE ARTHROSCOPY    . TONSILLECTOMY      Current Medications: Current Meds  Medication Sig  . acetaminophen (TYLENOL) 325 MG tablet Take 650 mg by mouth every 6 (six) hours as needed for mild pain or headache.   . albuterol (VENTOLIN HFA) 108 (90 Base) MCG/ACT inhaler Inhale 2 puffs into the lungs daily as needed.  Marland Kitchen allopurinol  (ZYLOPRIM) 300 MG tablet Take 1 tablet by mouth daily.  Marland Kitchen atorvastatin (LIPITOR) 40 MG tablet Take 20 mg by mouth daily. Taking 1/2 tab  . beta carotene w/minerals (OCUVITE) tablet Take 1 tablet by mouth daily.  . butalbital-acetaminophen-caffeine (FIORICET) 50-325-40 MG tablet 1 tablet as needed.   . famotidine (PEPCID) 40 MG tablet daily.  . ferrous sulfate 324 MG TBEC Take 324 mg by mouth daily with breakfast.  . fexofenadine (ALLEGRA ODT) 30 MG disintegrating tablet Take 30 mg by mouth daily.  . furosemide (LASIX) 40 MG tablet Take 1 tablet (40 mg total) by mouth 2 (two) times daily.  Marland Kitchen gabapentin (NEURONTIN) 300 MG capsule 300 mg daily.  Marland Kitchen HYDROcodone-acetaminophen (NORCO/VICODIN) 5-325 MG tablet   . isosorbide mononitrate (IMDUR) 60 MG 24 hr tablet TAKE 1 TABLET(60 MG) BY MOUTH DAILY  . metoprolol succinate (TOPROL-XL) 25 MG 24 hr tablet TAKE 1/2 TABLET(12.5 MG) BY MOUTH DAILY  . pantoprazole (PROTONIX) 40 MG tablet Take 40 mg by mouth 2 (two) times daily.  . potassium chloride SA (KLOR-CON) 20 MEQ tablet Take 1 tablet (20 mEq total) by mouth 2 (two) times daily.  . sucralfate (CARAFATE) 1 g tablet Take 1 g by mouth 4 (four) times daily -  with meals and at bedtime.  . SYMBICORT 160-4.5 MCG/ACT inhaler Inhale 2 puffs into the lungs daily as needed.     Allergies:   Lisinopril   Social History   Socioeconomic History  . Marital status: Married    Spouse name: Not on file  . Number of children: Not on file  . Years of education: Not on file  . Highest education level: Not on file  Occupational History  . Not on file  Tobacco Use  . Smoking status: Current Every Day Smoker    Packs/day: 0.25    Years: 51.00    Pack years: 12.75    Types: Cigarettes  . Smokeless tobacco: Never Used  Vaping Use  . Vaping Use: Never used  Substance and Sexual Activity  . Alcohol use: No  . Drug use: No  . Sexual activity: Not on file  Other Topics Concern  . Not on file  Social History  Narrative  . Not on file   Social Determinants of Health   Financial Resource Strain:   . Difficulty of Paying Living Expenses: Not on file  Food Insecurity: No Food Insecurity  . Worried About Charity fundraiser in the Last Year: Never true  . Ran Out of Food in the Last Year: Never true  Transportation Needs: No Transportation Needs  . Lack of Transportation (Medical): No  . Lack of Transportation (Non-Medical): No  Physical Activity:   . Days of Exercise per Week: Not on file  . Minutes of Exercise per Session: Not on file  Stress:   . Feeling of Stress : Not on file  Social Connections:   .  Frequency of Communication with Friends and Family: Not on file  . Frequency of Social Gatherings with Friends and Family: Not on file  . Attends Religious Services: Not on file  . Active Member of Clubs or Organizations: Not on file  . Attends Archivist Meetings: Not on file  . Marital Status: Not on file     Family History: The patient's family history includes Cancer in his mother and sister; Heart attack in his maternal grandfather; Hypertension in his brother, daughter, mother, and sister; Leukemia in his mother; Prostate cancer in his father.  ROS:   Review of Systems  Constitution: Negative for decreased appetite, fever and weight gain.  HENT: Negative for congestion, ear discharge, hoarse voice and sore throat.   Eyes: Negative for discharge, redness, vision loss in right eye and visual halos.  Cardiovascular: Negative for chest pain, dyspnea on exertion, leg swelling, orthopnea and palpitations.  Respiratory: Negative for cough, hemoptysis, shortness of breath and snoring.   Endocrine: Negative for heat intolerance and polyphagia.  Hematologic/Lymphatic: Negative for bleeding problem. Does not bruise/bleed easily.  Skin: Negative for flushing, nail changes, rash and suspicious lesions.  Musculoskeletal: Negative for arthritis, joint pain, muscle cramps, myalgias,  neck pain and stiffness.  Gastrointestinal: Negative for abdominal pain, bowel incontinence, diarrhea and excessive appetite.  Genitourinary: Negative for decreased libido, genital sores and incomplete emptying.  Neurological: Negative for brief paralysis, focal weakness, headaches and loss of balance.  Psychiatric/Behavioral: Negative for altered mental status, depression and suicidal ideas.  Allergic/Immunologic: Negative for HIV exposure and persistent infections.    EKGs/Labs/Other Studies Reviewed:    The following studies were reviewed today:   EKG:  None today  TTE Impression  1. Left ventricular ejection fraction, by visual estimation, is 30 to 35%. The left ventricle has moderately decreased function. There is mildly increased left ventricular hypertrophy.  2. The left ventricle demonstrates global hypokinesis.  3. Indeterminate diastolic filling due to E-A fusion.  4. Global right ventricle has normal systolic function.The right ventricular size is normal. No increase in right ventricular wall thickness.  5. Left atrial size was mildly dilated.  6. Right atrial size was mildly dilated.  7. Trivial pericardial effusion is present.  8. Mild mitral annular calcification.  9. The mitral valve is normal in structure. Mild mitral valve regurgitation. No evidence of mitral stenosis.  10. The tricuspid valve is normal in structure. Tricuspid valve regurgitation is not demonstrated.  11. The aortic valve was not well visualized. Aortic valve regurgitation is not visualized. The aortic valve was calcified, unable to see well enough to comment on leaflet number. I suspect there may be a degree of  aortic stenosis present but this study was inadequate to definitively interrogate the aortic valve. Would  consider TEE if concerned for significant aortic stenosis.  12. TR signal is inadequate for assessing pulmonary artery systolic pressure.  13. The inferior vena cava is dilated in  size with >50% respiratory variability, suggesting right atrial pressure of 8 mmHg.   Recent Labs: 04/29/2019: Hemoglobin 8.1; Platelets 480 09/03/2019: NT-Pro BNP 17,585 02/22/2020: BUN 25; Creatinine, Ser 1.18; Magnesium 2.0; Potassium 5.2; Sodium 139  Recent Lipid Panel No results found for: CHOL, TRIG, HDL, CHOLHDL, VLDL, LDLCALC, LDLDIRECT  Physical Exam:    VS:  BP (!) 146/82   Pulse 82   Ht 5\' 8"  (1.727 m)   Wt 173 lb 6.4 oz (78.7 kg)   SpO2 97%   BMI 26.37 kg/m  Wt Readings from Last 3 Encounters:  04/26/20 173 lb 6.4 oz (78.7 kg)  02/22/20 160 lb (72.6 kg)  11/12/19 165 lb 3.2 oz (74.9 kg)     GEN: Well nourished, well developed in no acute distress HEENT: Normal NECK: No JVD; No carotid bruits LYMPHATICS: No lymphadenopathy CARDIAC: S1S2 noted,RRR, no murmurs, rubs, gallops RESPIRATORY:  Clear to auscultation without rales, wheezing or rhonchi  ABDOMEN: Soft, non-tender, non-distended, +bowel sounds, no guarding. EXTREMITIES: +2 bilateral leg edema, No cyanosis, no clubbing MUSCULOSKELETAL:  No deformity  SKIN: Warm and dry NEUROLOGIC:  Alert and oriented x 3, non-focal PSYCHIATRIC:  Normal affect, good insight  ASSESSMENT:    1. HFrEF (heart failure with reduced ejection fraction) ([place])   2. Mixed hyperlipidemia   3. History of GI bleed   4. Ischemic cardiomyopathy   5. Essential hypertension   6. Medication management   7. 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 Ischemic toe    PLAN:    He does have worsening edema compared to the last time he was in the office. I will like to increase his lasix to 60 mg in the am and continue 40 mg in the pm. He will also take metolazone 5 mg 30 minutes before his first Lasix dose for the next 7 days.  We will get blood work today.  I plan to see the patient back in 1 week. In addition we will going to reassess his LV function as well as aortic valve.  With his ischemic feet changes will get arterial Doppler  and have also have referred the patient to vascular.  The patient is in agreement with the above plan. The patient left the office in stable condition.  The patient will follow up in 1 week.   Medication Adjustments/Labs and Tests Ordered: Current medicines are reviewed at length with the patient today.  Concerns regarding medicines are outlined above.  Orders Placed This Encounter  Procedures  . Basic metabolic panel  . Magnesium  . CBC  . Ambulatory referral to Vascular Surgery  . ECHOCARDIOGRAM COMPLETE  . VAS [place] ABI WITH/WO TBI   Meds ordered this encounter  Medications  . metolazone (ZAROXOLYN) 5 MG tablet    Sig: Take 1 tablet 30 minutes prior to taking your morning Lasix. Take for 7 days.    Dispense:  7 tablet    Refill:  0    Patient Instructions  Medication Instructions:  Your physician has recommended you make the following change in your medication: 1. START Metolazone 5 mg for 5 days -- take this 30 minutes before you morning Lasix 2. INCREASE Lasix to 60 mg in the morning & continue taking 40 mg in the afternoon  *If you need a refill on your cardiac medications before your next appointment, please call your pharmacy*   Lab Work: Today: BMET,  CBC and Magnesium If you have labs (blood work) drawn today and your tests are completely normal, you will receive your results only by: Marland Kitchen MyChart Message (if you have MyChart) OR . A paper copy in the mail If you have any lab test that is abnormal or we need to change your treatment, we will call you to review the results.   Testing/Procedures: Your physician has requested that you have an echocardiogram. Echocardiography is a painless test that uses sound waves to create images of your heart. It provides your doctor with information about the size and shape of your heart and  how well your heart's chambers and valves are working. This procedure takes approximately one hour. There are no restrictions for this  procedure.  Your physician has requested that you have a lower extremity arterial duplex. This test is an ultrasound of the arteries in the legs. It looks at arterial blood flow in the legs. Allow one hour for Lower  Arterial scans. There are no restrictions or special instructions.    Follow-Up: At [facility], you and your health needs are our priority.  As part of our continuing mission to provide you with exceptional heart care, we have created designated Provider Care Teams.  These Care Teams include your primary Cardiologist (physician) and Advanced Practice Providers (APPs -  Physician Assistants and Nurse Practitioners) who all work together to provide you with the care you need, when you need it.  Your next appointment:   1 week(s)  The format for your next appointment:   In Person  Provider:   [name], DO  You have been referred to Dr. Donzetta Matters at [facility]   Thank you for choosing [facility]!!     Other Instructions        Adopting a Healthy Lifestyle.  Know what a healthy weight is for you (roughly BMI <25) and aim to maintain this   Aim for 7+ servings of fruits and vegetables daily   65-80+ fluid ounces of water or unsweet tea for healthy kidneys   Limit to max 1 drink of alcohol per day; avoid smoking/tobacco   Limit animal fats in diet for cholesterol and heart health - choose grass fed whenever available   Avoid highly processed foods, and foods high in saturated/trans fats   Aim for low stress - take time to unwind and care for your mental health   Aim for 150 min of moderate intensity exercise weekly for heart health, and weights twice weekly for bone health   Aim for 7-9 hours of sleep daily   When it comes to diets, agreement about the perfect plan isnt easy to find, even among the experts. Experts at the [employer] developed an idea known as the Healthy Eating Plate. Just imagine a plate divided into logical, healthy  portions.   The emphasis is on diet quality:   Load up on vegetables and fruits - one-half of your plate: Aim for color and variety, and remember that potatoes dont count.   Go for whole grains - one-quarter of your plate: Whole wheat, barley, wheat berries, quinoa, oats, brown rice, and foods made with them. If you want pasta, go with whole wheat pasta.   Protein power - one-quarter of your plate: Fish, chicken, beans, and nuts are all healthy, versatile protein sources. Limit red meat.   The diet, however, does go beyond the plate, offering a few other suggestions.   Use healthy plant oils, such as olive, canola, soy, corn, sunflower and peanut. Check the labels, and avoid partially hydrogenated oil, which have unhealthy trans fats.   If youre thirsty, drink water. Coffee and tea are good in moderation, but skip sugary drinks and limit milk and dairy products to one or two daily servings.   The type of carbohydrate in the diet is more important than the amount. Some sources of carbohydrates, such as vegetables, fruits, whole grains, and beans-are healthier than others.   Finally, stay active  Signed, [name], DO  04/26/2020 11:42 AM    [place]

## 2020-04-26 NOTE — Addendum Note (Signed)
Addended by: Stanton Kidney on: 04/26/2020 11:50 AM   Modules accepted: Orders

## 2020-04-26 NOTE — Patient Instructions (Addendum)
Medication Instructions:  Your physician has recommended you make the following change in your medication: 1. START Metolazone 5 mg for 5 days -- take this 30 minutes before you morning Lasix 2. INCREASE Lasix to 60 mg in the morning & continue taking 40 mg in the afternoon  *If you need a refill on your cardiac medications before your next appointment, please call your pharmacy*   Lab Work: Today: BMET,  CBC and Magnesium If you have labs (blood work) drawn today and your tests are completely normal, you will receive your results only by: Marland Kitchen MyChart Message (if you have MyChart) OR . A paper copy in the mail If you have any lab test that is abnormal or we need to change your treatment, we will call you to review the results.   Testing/Procedures: Your physician has requested that you have an echocardiogram. Echocardiography is a painless test that uses sound waves to create images of your heart. It provides your doctor with information about the size and shape of your heart and how well your heart's chambers and valves are working. This procedure takes approximately one hour. There are no restrictions for this procedure.  Your physician has requested that you have a lower extremity arterial duplex. This test is an ultrasound of the arteries in the legs. It looks at arterial blood flow in the legs. Allow one hour for Lower  Arterial scans. There are no restrictions or special instructions.    Follow-Up: At [facility], you and your health needs are our priority.  As part of our continuing mission to provide you with exceptional heart care, we have created designated Provider Care Teams.  These Care Teams include your primary Cardiologist (physician) and Advanced Practice Providers (APPs -  Physician Assistants and Nurse Practitioners) who all work together to provide you with the care you need, when you need it.  Your next appointment:   1 week(s)  The format for your next appointment:    In Person  Provider:   [name], DO  You have been referred to Dr. Donzetta Matters at [facility]   Thank you for choosing [facility]!!     Other Instructions

## 2020-04-27 ENCOUNTER — Other Ambulatory Visit: Payer: Self-pay | Admitting: Cardiology

## 2020-04-27 ENCOUNTER — Telehealth: Payer: Self-pay

## 2020-04-27 LAB — BASIC METABOLIC PANEL
BUN/Creatinine Ratio: 32 — ABNORMAL HIGH (ref 10–24)
BUN: 57 mg/dL — ABNORMAL HIGH (ref 8–27)
CO2: 19 mmol/L — ABNORMAL LOW (ref 20–29)
Calcium: 10 mg/dL (ref 8.6–10.2)
Chloride: 99 mmol/L (ref 96–106)
Creatinine, Ser: 1.77 mg/dL — ABNORMAL HIGH (ref 0.76–1.27)
GFR calc Af Amer: 42 mL/min/{1.73_m2} — ABNORMAL LOW (ref >59)
GFR calc non Af Amer: 36 mL/min/{1.73_m2} — ABNORMAL LOW (ref >59)
Glucose: 113 mg/dL — ABNORMAL HIGH (ref 65–99)
Potassium: 5.8 mmol/L — ABNORMAL HIGH (ref 3.5–5.2)
Sodium: 135 mmol/L (ref 134–144)

## 2020-04-27 LAB — CBC
Hematocrit: 44.9 % (ref 37.5–51.0)
Hemoglobin: 15.3 g/dL (ref 13.0–17.7)
MCH: 30.7 pg (ref 26.6–33.0)
MCHC: 34.1 g/dL (ref 31.5–35.7)
MCV: 90 fL (ref 79–97)
Platelets: 140 10*3/uL — ABNORMAL LOW (ref 150–450)
RBC: 4.99 x10E6/uL (ref 4.14–5.80)
RDW: 18.8 % — ABNORMAL HIGH (ref 11.6–15.4)
WBC: 10.5 10*3/uL (ref 3.4–10.8)

## 2020-04-27 LAB — MAGNESIUM: Magnesium: 2.4 mg/dL — ABNORMAL HIGH (ref 1.6–2.3)

## 2020-04-27 NOTE — Telephone Encounter (Signed)
-----   Message from Richardo Priest, MD sent at 04/27/2020  1:59 PM EST ----- No he should not take metolazone

## 2020-04-27 NOTE — Telephone Encounter (Signed)
Spoke to the patient and the patients son just now and let him know Dr. Joya Gaskins recommendation. He verbalizes understanding and thanks me for the call back.

## 2020-04-29 ENCOUNTER — Telehealth: Payer: Self-pay | Admitting: Cardiology

## 2020-04-29 NOTE — Telephone Encounter (Signed)
Received outpatient page regarding patient's lower extremity edema.  He was recently seen 04/26/2020 by Dr. Harriet Masson for for CHF.  At that time, Lasix plan was for 60 mg in the a.m. and 40 mg in the p.m. with 5 mg of metolazone 30 minutes prior to a.m. Lasix dosing x7 days.  Unfortunately, creatinine returned elevated at 1.71 up from 2 months prior at 1.13.  Dr. Bettina Gavia had him stop metolazone and stop potassium supplementation and reduce back down to PTA Lasix dosing at 40 mg a.m. and 40 mg p.m.  Son-in-law called today stating that his legs are now weeping with fluid.  Per his report, he responded very well to the metolazone with increased urination and legs did improve slightly.  We discussed that creatinine could be elevated due to fluid volume overload.  Given his response, will add metolazone 5 mg 30 minutes prior to a.m. dosing of Lasix 40 mg / 40 kg in the p.m. starting tomorrow x2 days.  He has close follow-up on 05/03/2020 with Dr. Harriet Masson along with follow-up lab work.  They are to call me back tomorrow to let me know his response.  I will forward this message to his primary cardiologist for review.  Patient and family understand and agree with plan above.  ED precautions reviewed.  Kathyrn Drown NP-C [place] Pager: [phone]

## 2020-04-30 ENCOUNTER — Encounter (HOSPITAL_COMMUNITY): Payer: Self-pay | Admitting: Emergency Medicine

## 2020-04-30 ENCOUNTER — Emergency Department (HOSPITAL_BASED_OUTPATIENT_CLINIC_OR_DEPARTMENT_OTHER): Payer: Medicare HMO

## 2020-04-30 ENCOUNTER — Observation Stay (HOSPITAL_COMMUNITY)
Admission: EM | Admit: 2020-04-30 | Discharge: 2020-05-02 | Disposition: A | Discharge status: Home / Self Care | Payer: Medicare HMO | Attending: Family Medicine | Admitting: Family Medicine

## 2020-04-30 ENCOUNTER — Emergency Department (HOSPITAL_COMMUNITY): Payer: Medicare HMO

## 2020-04-30 ENCOUNTER — Other Ambulatory Visit: Payer: Self-pay

## 2020-04-30 ENCOUNTER — Observation Stay (HOSPITAL_COMMUNITY): Payer: Medicare HMO

## 2020-04-30 DIAGNOSIS — L97511 Non-pressure chronic ulcer of other part of right foot limited to breakdown of skin: Secondary | ICD-10-CM

## 2020-04-30 DIAGNOSIS — Z86718 Personal history of other venous thrombosis and embolism: Secondary | ICD-10-CM | POA: Diagnosis not present

## 2020-04-30 DIAGNOSIS — I5043 Acute on chronic combined systolic (congestive) and diastolic (congestive) heart failure: Secondary | ICD-10-CM | POA: Diagnosis not present

## 2020-04-30 DIAGNOSIS — M7989 Other specified soft tissue disorders: Secondary | ICD-10-CM | POA: Insufficient documentation

## 2020-04-30 DIAGNOSIS — I110 Hypertensive heart disease with heart failure: Secondary | ICD-10-CM | POA: Diagnosis not present

## 2020-04-30 DIAGNOSIS — I5023 Acute on chronic systolic (congestive) heart failure: Principal | ICD-10-CM | POA: Insufficient documentation

## 2020-04-30 DIAGNOSIS — Z95828 Presence of other vascular implants and grafts: Secondary | ICD-10-CM | POA: Insufficient documentation

## 2020-04-30 DIAGNOSIS — Z79899 Other long term (current) drug therapy: Secondary | ICD-10-CM | POA: Insufficient documentation

## 2020-04-30 DIAGNOSIS — Z23 Encounter for immunization: Secondary | ICD-10-CM | POA: Insufficient documentation

## 2020-04-30 DIAGNOSIS — F17210 Nicotine dependence, cigarettes, uncomplicated: Secondary | ICD-10-CM | POA: Insufficient documentation

## 2020-04-30 DIAGNOSIS — Z20822 Contact with and (suspected) exposure to covid-19: Secondary | ICD-10-CM | POA: Diagnosis not present

## 2020-04-30 DIAGNOSIS — I130 Hypertensive heart and chronic kidney disease with heart failure and stage 1 through stage 4 chronic kidney disease, or unspecified chronic kidney disease: Secondary | ICD-10-CM | POA: Diagnosis not present

## 2020-04-30 DIAGNOSIS — I517 Cardiomegaly: Secondary | ICD-10-CM | POA: Diagnosis not present

## 2020-04-30 DIAGNOSIS — I509 Heart failure, unspecified: Secondary | ICD-10-CM | POA: Diagnosis not present

## 2020-04-30 DIAGNOSIS — J449 Chronic obstructive pulmonary disease, unspecified: Secondary | ICD-10-CM | POA: Diagnosis not present

## 2020-04-30 DIAGNOSIS — R233 Spontaneous ecchymoses: Secondary | ICD-10-CM

## 2020-04-30 DIAGNOSIS — R609 Edema, unspecified: Secondary | ICD-10-CM | POA: Diagnosis not present

## 2020-04-30 DIAGNOSIS — L97521 Non-pressure chronic ulcer of other part of left foot limited to breakdown of skin: Secondary | ICD-10-CM

## 2020-04-30 DIAGNOSIS — R296 Repeated falls: Secondary | ICD-10-CM | POA: Diagnosis not present

## 2020-04-30 DIAGNOSIS — I2510 Atherosclerotic heart disease of native coronary artery without angina pectoris: Secondary | ICD-10-CM | POA: Diagnosis not present

## 2020-04-30 DIAGNOSIS — N182 Chronic kidney disease, stage 2 (mild): Secondary | ICD-10-CM | POA: Diagnosis not present

## 2020-04-30 DIAGNOSIS — R0602 Shortness of breath: Secondary | ICD-10-CM | POA: Diagnosis not present

## 2020-04-30 DIAGNOSIS — R600 Localized edema: Secondary | ICD-10-CM | POA: Diagnosis present

## 2020-04-30 DIAGNOSIS — L8990 Pressure ulcer of unspecified site, unspecified stage: Secondary | ICD-10-CM | POA: Insufficient documentation

## 2020-04-30 LAB — APTT: aPTT: 27 seconds (ref 24–36)

## 2020-04-30 LAB — PROTIME-INR
INR: 1.1 (ref 0.8–1.2)
Prothrombin Time: 14 seconds (ref 11.4–15.2)

## 2020-04-30 LAB — BASIC METABOLIC PANEL
Anion gap: 15 (ref 5–15)
BUN: 65 mg/dL — ABNORMAL HIGH (ref 8–23)
CO2: 27 mmol/L (ref 22–32)
Calcium: 9.6 mg/dL (ref 8.9–10.3)
Chloride: 95 mmol/L — ABNORMAL LOW (ref 98–111)
Creatinine, Ser: 1.93 mg/dL — ABNORMAL HIGH (ref 0.61–1.24)
GFR, Estimated: 35 mL/min — ABNORMAL LOW (ref >60)
Glucose, Bld: 114 mg/dL — ABNORMAL HIGH (ref 70–99)
Potassium: 4.2 mmol/L (ref 3.5–5.1)
Sodium: 137 mmol/L (ref 135–145)

## 2020-04-30 LAB — COMPREHENSIVE METABOLIC PANEL
ALT: 105 U/L — ABNORMAL HIGH (ref 0–44)
AST: 26 U/L (ref 15–41)
Albumin: 3 g/dL — ABNORMAL LOW (ref 3.5–5.0)
Alkaline Phosphatase: 121 U/L (ref 38–126)
Anion gap: 16 — ABNORMAL HIGH (ref 5–15)
BUN: 63 mg/dL — ABNORMAL HIGH (ref 8–23)
CO2: 28 mmol/L (ref 22–32)
Calcium: 9.3 mg/dL (ref 8.9–10.3)
Chloride: 92 mmol/L — ABNORMAL LOW (ref 98–111)
Creatinine, Ser: 1.65 mg/dL — ABNORMAL HIGH (ref 0.61–1.24)
GFR, Estimated: 43 mL/min — ABNORMAL LOW (ref >60)
Glucose, Bld: 138 mg/dL — ABNORMAL HIGH (ref 70–99)
Potassium: 2.9 mmol/L — ABNORMAL LOW (ref 3.5–5.1)
Sodium: 136 mmol/L (ref 135–145)
Total Bilirubin: 2 mg/dL — ABNORMAL HIGH (ref 0.3–1.2)
Total Protein: 6.3 g/dL — ABNORMAL LOW (ref 6.5–8.1)

## 2020-04-30 LAB — CBC WITH DIFFERENTIAL/PLATELET
Abs Immature Granulocytes: 0.08 10*3/uL — ABNORMAL HIGH (ref 0.00–0.07)
Basophils Absolute: 0 10*3/uL (ref 0.0–0.1)
Basophils Relative: 0 %
Eosinophils Absolute: 0 10*3/uL (ref 0.0–0.5)
Eosinophils Relative: 0 %
HCT: 51.1 % (ref 39.0–52.0)
Hemoglobin: 16.9 g/dL (ref 13.0–17.0)
Immature Granulocytes: 1 %
Lymphocytes Relative: 12 %
Lymphs Abs: 1.3 10*3/uL (ref 0.7–4.0)
MCH: 30.2 pg (ref 26.0–34.0)
MCHC: 33.1 g/dL (ref 30.0–36.0)
MCV: 91.3 fL (ref 80.0–100.0)
Monocytes Absolute: 0.8 10*3/uL (ref 0.1–1.0)
Monocytes Relative: 7 %
Neutro Abs: 8.8 10*3/uL — ABNORMAL HIGH (ref 1.7–7.7)
Neutrophils Relative %: 80 %
Platelets: 133 10*3/uL — ABNORMAL LOW (ref 150–400)
RBC: 5.6 MIL/uL (ref 4.22–5.81)
RDW: 19.9 % — ABNORMAL HIGH (ref 11.5–15.5)
WBC: 11 10*3/uL — ABNORMAL HIGH (ref 4.0–10.5)
nRBC: 0.2 % (ref 0.0–0.2)

## 2020-04-30 LAB — RESPIRATORY PANEL BY RT PCR (FLU A&B, COVID)
Influenza A by PCR: NEGATIVE
Influenza B by PCR: NEGATIVE
SARS Coronavirus 2 by RT PCR: NEGATIVE

## 2020-04-30 LAB — BRAIN NATRIURETIC PEPTIDE: B Natriuretic Peptide: 3258.8 pg/mL — ABNORMAL HIGH (ref 0.0–100.0)

## 2020-04-30 MED ORDER — SODIUM CHLORIDE 0.9% FLUSH
3.0000 mL | Freq: Two times a day (BID) | INTRAVENOUS | Status: DC
  Administered 2020-04-30 – 2020-05-02 (×4): 3 mL via INTRAVENOUS

## 2020-04-30 MED ORDER — ENOXAPARIN SODIUM 40 MG/0.4ML ~~LOC~~ SOLN
40.0000 mg | SUBCUTANEOUS | Status: DC
  Administered 2020-04-30 – 2020-05-01 (×2): 40 mg via SUBCUTANEOUS
  Filled 2020-04-30 (×2): qty 0.4

## 2020-04-30 MED ORDER — ALLOPURINOL 300 MG PO TABS
300.0000 mg | ORAL_TABLET | Freq: Every day | ORAL | Status: DC
  Administered 2020-05-01 – 2020-05-02 (×2): 300 mg via ORAL
  Filled 2020-04-30 (×2): qty 1

## 2020-04-30 MED ORDER — FAMOTIDINE 20 MG PO TABS
40.0000 mg | ORAL_TABLET | Freq: Every evening | ORAL | Status: DC
  Administered 2020-04-30 – 2020-05-01 (×2): 40 mg via ORAL
  Filled 2020-04-30 (×2): qty 2

## 2020-04-30 MED ORDER — SODIUM CHLORIDE 0.9% FLUSH: 3.0000 mL | INTRAVENOUS | Status: DC | PRN

## 2020-04-30 MED ORDER — PANTOPRAZOLE SODIUM 40 MG PO TBEC
40.0000 mg | DELAYED_RELEASE_TABLET | Freq: Two times a day (BID) | ORAL | Status: DC
  Administered 2020-04-30 – 2020-05-02 (×4): 40 mg via ORAL
  Filled 2020-04-30 (×4): qty 1

## 2020-04-30 MED ORDER — FUROSEMIDE 10 MG/ML IJ SOLN
60.0000 mg | Freq: Once | INTRAMUSCULAR | Status: AC
  Administered 2020-04-30: 60 mg via INTRAVENOUS
  Filled 2020-04-30: qty 6

## 2020-04-30 MED ORDER — GABAPENTIN 300 MG PO CAPS
300.0000 mg | ORAL_CAPSULE | Freq: Every day | ORAL | Status: DC
  Administered 2020-04-30 – 2020-05-01 (×2): 300 mg via ORAL
  Filled 2020-04-30 (×2): qty 1

## 2020-04-30 MED ORDER — NICOTINE 7 MG/24HR TD PT24
7.0000 mg | MEDICATED_PATCH | Freq: Every day | TRANSDERMAL | Status: DC
  Administered 2020-04-30 – 2020-05-02 (×3): 7 mg via TRANSDERMAL
  Filled 2020-04-30 (×4): qty 1

## 2020-04-30 MED ORDER — ONDANSETRON HCL 4 MG/2ML IJ SOLN: 4.0000 mg | Freq: Four times a day (QID) | INTRAMUSCULAR | Status: DC | PRN

## 2020-04-30 MED ORDER — ATORVASTATIN CALCIUM 10 MG PO TABS
20.0000 mg | ORAL_TABLET | Freq: Every day | ORAL | Status: DC
  Administered 2020-05-01 – 2020-05-02 (×2): 20 mg via ORAL
  Filled 2020-04-30 (×2): qty 2

## 2020-04-30 MED ORDER — FUROSEMIDE 10 MG/ML IJ SOLN
40.0000 mg | Freq: Two times a day (BID) | INTRAMUSCULAR | Status: DC
  Administered 2020-04-30 – 2020-05-01 (×2): 40 mg via INTRAVENOUS
  Filled 2020-04-30 (×2): qty 4

## 2020-04-30 MED ORDER — OXYCODONE HCL 5 MG PO TABS
5.0000 mg | ORAL_TABLET | ORAL | Status: DC | PRN
  Administered 2020-05-01 – 2020-05-02 (×4): 5 mg via ORAL
  Filled 2020-04-30 (×5): qty 1

## 2020-04-30 MED ORDER — INFLUENZA VAC A&B SA ADJ QUAD 0.5 ML IM PRSY
0.5000 mL | PREFILLED_SYRINGE | INTRAMUSCULAR | Status: AC
  Administered 2020-05-02: 0.5 mL via INTRAMUSCULAR
  Filled 2020-04-30: qty 0.5

## 2020-04-30 MED ORDER — ACETAMINOPHEN 325 MG PO TABS: 650.0000 mg | ORAL_TABLET | Freq: Four times a day (QID) | ORAL | Status: DC | PRN

## 2020-04-30 MED ORDER — METOPROLOL SUCCINATE ER 25 MG PO TB24
12.5000 mg | ORAL_TABLET | Freq: Every day | ORAL | Status: DC
  Administered 2020-05-01 – 2020-05-02 (×2): 12.5 mg via ORAL
  Filled 2020-04-30 (×2): qty 1

## 2020-04-30 MED ORDER — MOMETASONE FURO-FORMOTEROL FUM 200-5 MCG/ACT IN AERO
2.0000 | INHALATION_SPRAY | Freq: Two times a day (BID) | RESPIRATORY_TRACT | Status: DC
  Administered 2020-05-01 – 2020-05-02 (×3): 2 via RESPIRATORY_TRACT
  Filled 2020-04-30: qty 8.8

## 2020-04-30 MED ORDER — ACETAMINOPHEN 325 MG PO TABS: 650.0000 mg | ORAL_TABLET | ORAL | Status: DC | PRN

## 2020-04-30 MED ORDER — ISOSORBIDE MONONITRATE ER 60 MG PO TB24
60.0000 mg | ORAL_TABLET | Freq: Every day | ORAL | Status: DC
  Administered 2020-05-01 – 2020-05-02 (×2): 60 mg via ORAL
  Filled 2020-04-30 (×2): qty 1

## 2020-04-30 MED ORDER — ALBUTEROL SULFATE HFA 108 (90 BASE) MCG/ACT IN AERS
2.0000 | INHALATION_SPRAY | Freq: Every day | RESPIRATORY_TRACT | Status: DC | PRN
  Filled 2020-04-30: qty 6.7

## 2020-04-30 MED ORDER — PROSIGHT PO TABS
1.0000 | ORAL_TABLET | Freq: Every day | ORAL | Status: DC
  Administered 2020-05-01 – 2020-05-02 (×2): 1 via ORAL
  Filled 2020-04-30 (×2): qty 1

## 2020-04-30 MED ORDER — SODIUM CHLORIDE 0.9 % IV SOLN: 250.0000 mL | INTRAVENOUS | Status: DC | PRN

## 2020-04-30 NOTE — Consult Note (Addendum)
WOC Nurse Consult Note: Reason for Consult: Interdigital moisture associated skin damage, specifically intertriginous dermatitis (ITD) Wound type:Moisture associated skin damage (MASD) Pressure Injury POA: N/A Measurement: Weeping areas between digits that have eroded tissue (partial and full thickness area) due to friction Wound bed:wet, red Drainage (amount, consistency, odor): Small amount serous to serosanguinous Periwound:macerated Dressing procedure/placement/frequency: I have provided Nursing with guidance for the daily care of the interdigital wounds using a silver hydrofiber cut into strips and placed between toes after cleansing and drying. A dry boot wrap from toes to knee is provided as well as bilateral pressure redistribution heel boots to mitigate edema and prevent pressure injury. I have communicated with Dr. Jeani Hawking via [place] and recommended a Podiatric Medicine consult when in house not only for toenail debridement, but for contributions to the POC. If she agrees, she will order.  [place] nursing team will not follow, but will remain available to this patient, the nursing and medical teams.  Please re-consult if needed. Thanks, Maudie Flakes, MSN, RN, [place], Arther Abbott  Pager# [phone]

## 2020-04-30 NOTE — Progress Notes (Signed)
Lower extremity venous has been completed.   Preliminary results in CV Proc.   Abram Sander 04/30/2020 1:48 PM

## 2020-04-30 NOTE — ED Provider Notes (Signed)
Medical screening examination/treatment/procedure(s) were conducted as a shared visit with non-physician practitioner(s) and myself.  I personally evaluated the patient during the encounter.  Clinical Impression:   Final diagnoses:  Acute on chronic congestive heart failure, unspecified heart failure type (HCC)  Frequent falls  Petechiae    This patient is a chronically ill 79 year old male, he is on Lasix for swelling, recently increased on dosing of his Lasix as well as metolazone however this caused some worsening of his renal failure so that was decreased, his swelling is worsened and he presents today with bilateral edema from the knees through the feet, this is severe at the feet, left more than right, no DVT on ultrasound.  Labs show worsening renal function, vital signs rather unremarkable, the patient has no rales but appears mildly tachypneic.  I suspect he has some element of worsening congestive heart failure however with his worsening renal function and need for diuresis he will likely need to have this done as an inpatient.  Patient agreeable   Noemi Chapel, MD 05/01/20 [phone]

## 2020-04-30 NOTE — ED Notes (Signed)
Admitting MD at bedside.

## 2020-04-30 NOTE — Consult Note (Signed)
Cardiology Consultation:   Patient ID: Richard Townsend; [id number]; Jan 20, 1941   Admit date: 04/30/2020 Date of Consult: 04/30/2020  Primary Care Provider: Ernestene Kiel, MD Primary Cardiologist: Dr. [name], MD   Patient Profile:   Richard Townsend is a 79 y.o. male with a hx of CAD per [facility] 03/24/2019 showing mild distal 30% left main, proximal LAD 50% with codominant RCA with severe diffuse CAD with ostial 100% CTO, DVT on anticoagulation with Eliquis 5 mg twice daily, hypertension, cardiomyopathy with most recent LVEF at 30%, HLD and history of GI bleed who is being seen today for the evaluation of CHF at the request of Dr. Sabra Heck.  History of Present Illness:   Richard Townsend is a 79 year old male with a history as stated above who presented to [facility] on 04/30/2020 with acute CHF exacerbation.  He has had recent issues with CHF.  He was seen 08/2019 by his primary cardiologist at which time he appeared to be fluid volume overloaded.  He was initially asked to report to the ED however patient deferred and wished to increase his home Lasix at that time.  Lab work came back with a significantly elevated BNP therefore he proceeded to the ED.  He was hospitalized at [facility] for acute on chronic heart failure at which time he underwent significant diuresis.  This was complicated by anemia secondary to his Eliquis therefore this was stopped.  His losartan and nitrates were stopped secondary to borderline hypotension.  He was then seen in follow-up 11/12/2019 and appeared to be euvolemic at that time therefore he was continued on his current regimen.  Last year, 03/20/2019 patient was hospitalized at [place] for acute hypoxic respiratory failure felt to be secondary to acute CHF.  Echocardiogram at that time showed an LVEF at 25 to 30% with global hypokinesis felt to be stress-induced cardiomyopathy given a recent critical illness with cardiac arrest which was attributed to  upper GI bleeding and hypotension.  He was placed on Entresto, isosorbide mononitrate and metoprolol and underwent a cardiac cath which showed no CAD.  At most recent follow-up 04/26/2020, he had complaints of shortness of breath and worsening LE edema.  Plan at that time was to increase his Lasix to 60 mg in the a.m. and continue 40 mg in the p.m. with the addition of metolazone 5 mg prior to his first Lasix dosing for 7 days with close follow-up on 05/03/2020.  Lab work from that visit showed an elevated creatinine at 1.77 up from 1.18 therefore on call MD had him decrease his Lasix back down to 40 mg in the a.m. and 40 mg in the p.m. and discontinue the metolazone.  Plan was for lower extremity arterial Doppler studies given ischemic changes in his feet as well however these have not yet been performed.  Family called outpatient answering service 04/29/2020 at which time patient had significantly worsening symptoms including weeping lower extremities.  Patient deferred the ED once again. Out of concern for worsening edema, plan was to continue 40 mg in the a.m. and 40 mg in the p.m. with 2 days of metolazone with close follow-up.  ED precautions were discussed at that time.  He then presented to the ED given his worsening symptoms at which time creatinine has again worsened to 1.93 and his BNP is markedly elevated at 3258. CXR with mild cardiomegaly suggestive of minimal vascular congestion. Edema extends from knees to feet. No DVT on LE [place].  Past Medical History:  Diagnosis Date  . AAA (abdominal aortic aneurysm) ([place]) 11/2011  . Abdominal aneurysm without mention of rupture 01/06/2012  . Abdominal tightness 01/11/2013  . Allergic rhinitis   . Arthritis   . DVT (deep venous thrombosis) ([place])   . Dysmetabolic syndrome   . Gastritis and gastroduodenitis   . Generalized osteoarthritis   . GERD (gastroesophageal reflux disease)   . GI bleed 04/13/2019  . GIB (gastrointestinal bleeding) 04/13/2019  .  Gout   . Headache(784.0)   . HFrEF (heart failure with reduced ejection fraction) ([place]) 08/06/2019  . History of GI bleed 08/06/2019  . Hypercholesterolemia   . Hypertension   . Ischemic cardiomyopathy 08/06/2019  . Lower extremity edema   . Melena 04/13/2019  . Mixed hyperlipidemia 08/06/2019  . Nausea alone 07/13/2013  . 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 Peptic ulcer   . Shortness of breath   . Symptomatic anemia 04/13/2019  . UGI bleed     Past Surgical History:  Procedure Laterality Date  . ABDOMINAL AORTIC ANEURYSM REPAIR  11/28/11   stent   . ABDOMINAL AORTIC ANEURYSM REPAIR    . APPENDECTOMY    . BIOPSY  04/15/2019   Procedure: BIOPSY;  Surgeon: Thornton Park, MD;  Location: [facility]COPY;  Service: Gastroenterology;;  . cataracts    . ESOPHAGOGASTRODUODENOSCOPY (EGD) WITH PROPOFOL N/A 04/15/2019   Procedure: ESOPHAGOGASTRODUODENOSCOPY (EGD) WITH PROPOFOL;  Surgeon: Thornton Park, MD;  Location: [facility];  Service: Gastroenterology;  Laterality: N/A;  . EYE SURGERY  2009   Left cataract  . 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 FOOT SURGERY    . IR IVC FILTER PLMT / S&I /IMG GUID/MOD SED  04/14/2019  . KNEE ARTHROSCOPY    . TONSILLECTOMY       Prior to Admission medications   Medication Sig Start Date End Date Taking? Authorizing Provider  acetaminophen (TYLENOL) 325 MG tablet Take 650 mg by mouth every 6 (six) hours as needed for mild pain.    Yes [provider]  albuterol (VENTOLIN HFA) 108 (90 Base) MCG/ACT inhaler Inhale 2 puffs into the lungs daily as needed for wheezing or shortness of breath.  10/30/19  Yes [provider]  allopurinol (ZYLOPRIM) 300 MG tablet Take 300 mg by mouth daily.  06/24/12  Yes [provider]  atorvastatin (LIPITOR) 20 MG tablet Take 20 mg by mouth daily.    Yes [provider]  beta carotene w/minerals (OCUVITE) tablet Take 1 tablet  by mouth daily.   Yes [provider]  famotidine (PEPCID) 40 MG tablet Take 40 mg by mouth every evening.  02/15/20  Yes [provider]  ferrous sulfate 324 MG TBEC Take 324 mg by mouth daily with breakfast.   Yes [provider]  furosemide (LASIX) 40 MG tablet Take 1 tablet (40 mg total) by mouth 2 (two) times daily. 11/12/19  Yes Tobb, Kardie, DO  gabapentin (NEURONTIN) 300 MG capsule Take 300 mg by mouth at bedtime.  07/19/19  Yes [provider]  HYDROcodone-acetaminophen (NORCO/VICODIN) 5-325 MG tablet Take 1 tablet by mouth every 6 (six) hours as needed for moderate pain.  04/20/20  Yes [provider]  isosorbide mononitrate (IMDUR) 60 MG 24 hr tablet TAKE 1 TABLET(60 MG) BY MOUTH DAILY Patient taking differently: Take 60 mg by mouth daily.  02/16/20  Yes Tobb, Kardie, DO  metolazone (ZAROXOLYN) 5 MG tablet Take  1 tablet 30 minutes prior to taking your morning Lasix. Take for 7 days. Patient taking differently: Take 5 mg by mouth See admin instructions. Take 1 tablet (5mg ) 30 minutes prior to taking your morning Lasix. 04/26/20  Yes Tobb, Kardie, DO  metoprolol succinate (TOPROL-XL) 25 MG 24 hr tablet TAKE 1/2 TABLET(12.5 MG) BY MOUTH DAILY Patient taking differently: Take 12.5 mg by mouth daily.  10/15/19  Yes Tobb, Kardie, DO  nitroGLYCERIN (NITROSTAT) 0.4 MG SL tablet Place 1 tablet (0.4 mg total) under the tongue every 5 (five) minutes as needed. Patient taking differently: Place 0.4 mg under the tongue every 5 (five) minutes as needed for chest pain.  08/20/19 04/30/20 Yes Tobb, Kardie, DO  pantoprazole (PROTONIX) 40 MG tablet Take 40 mg by mouth 2 (two) times daily.   Yes [provider]  potassium chloride SA (KLOR-CON) 20 MEQ tablet Take 1 tablet (20 mEq total) by mouth 2 (two) times daily. 11/12/19  Yes Tobb, Kardie, DO  sucralfate (CARAFATE) 1 g tablet Take 1 g by mouth 4 (four) times daily -  with meals and at bedtime.   Yes [provider]  SYMBICORT 160-4.5 MCG/ACT inhaler Inhale 2 puffs into the lungs daily as needed (for asthma).  10/21/19  Yes [provider]    Inpatient Medications: Scheduled Meds: . [START ON 05/01/2020] allopurinol  300 mg Oral Daily  . [START ON 05/01/2020] atorvastatin  20 mg Oral Daily  . enoxaparin (LOVENOX) injection  40 mg Subcutaneous Q24H  . famotidine  40 mg Oral QPM  . gabapentin  300 mg Oral QHS  . [START ON 05/01/2020] isosorbide mononitrate  60 mg Oral Daily  . [START ON 05/01/2020] metoprolol succinate  12.5 mg Oral Daily  . mometasone-formoterol  2 puff Inhalation BID  . [START ON 05/01/2020] multivitamin  1 tablet Oral Daily  . nicotine  7 mg Transdermal Daily  . pantoprazole  40 mg Oral BID  . sodium chloride flush  3 mL Intravenous Q12H   Continuous Infusions: . sodium chloride     PRN Meds: sodium chloride, acetaminophen, albuterol, ondansetron (ZOFRAN) IV, oxyCODONE, sodium chloride flush  Allergies:    Allergies  Allergen Reactions  . Lisinopril Cough    Social History:   Social History   Socioeconomic History  . Marital status: Married    Spouse name: Not on file  . Number of children: Not on file  . Years of education: Not on file  . Highest education level: Not on file  Occupational History  . Not on file  Tobacco Use  . Smoking status: Current Every Day Smoker    Packs/day: 0.25    Years: 51.00    Pack years: 12.75    Types: Cigarettes  . Smokeless tobacco: Never Used  Vaping Use  . Vaping Use: Never used  Substance and Sexual Activity  . Alcohol use: No  . Drug use: No  . Sexual activity: Not on file  Other Topics Concern  . Not on file  Social History Narrative  . Not on file   Social Determinants of Health   Financial Resource Strain:   . Difficulty of Paying Living Expenses: Not on file  Food Insecurity: No Food Insecurity  . Worried About Charity fundraiser in the Last Year: Never true  . Ran Out of Food in  the Last Year: Never true  Transportation Needs: No Transportation Needs  . Lack of Transportation (Medical): No  . Lack of Transportation (Non-Medical):  No  Physical Activity:   . Days of Exercise per Week: Not on file  . Minutes of Exercise per Session: Not on file  Stress:   . Feeling of Stress : Not on file  Social Connections:   . Frequency of Communication with Friends and Family: Not on file  . Frequency of Social Gatherings with Friends and Family: Not on file  . Attends Religious Services: Not on file  . Active Member of Clubs or Organizations: Not on file  . Attends Archivist Meetings: Not on file  . Marital Status: Not on file  Intimate Partner Violence:   . Fear of Current or Ex-Partner: Not on file  . Emotionally Abused: Not on file  . Physically Abused: Not on file  . Sexually Abused: Not on file    Family History:   Family History  Problem Relation Age of Onset  . Cancer Mother   . Hypertension Mother   . Leukemia Mother   . Prostate cancer Father   . Cancer Sister   . Hypertension Sister   . Hypertension Brother   . Hypertension Daughter   . Heart attack Maternal Grandfather    Family Status:  Family Status  Relation Name Status  . Mother  Deceased  . Father  Deceased  . Sister  (Not Specified)  . Brother  (Not Specified)  . Daughter  (Not Specified)  . MGF  (Not Specified)    ROS:  Please see the history of present illness.  All other ROS reviewed and negative.     Physical Exam/Data:   Vitals:   04/30/20 1430 04/30/20 1445 04/30/20 1545 04/30/20 1600  BP: (!) 131/92 (!) 145/94  134/87  Pulse: 81 80 77 82  Resp: (!) 26 14 20 18   Temp:      TempSrc:      SpO2: 93% 96% 95% 99%    Intake/Output Summary (Last 24 hours) at 04/30/2020 1609 Last data filed at 04/30/2020 1452 Gross per 24 hour  Intake --  Output 400 ml  Net -400 ml   There were no vitals filed for this visit. There is no height or weight on file to calculate  BMI.   General:  Frail appearing, uncomfortable appearing. HEENT: normal Lymph: no adenopathy Neck: no JVD Endocrine:  No thryomegaly Vascular: unable to assess LE pulses due to edema.  Cardiac:  normal S1, S2; RRR Lungs:  clear to auscultation bilaterally, no wheezing, rhonchi or rales  Abd: soft, nontender, no hepatomegaly  Ext: 2-3+ pitting edema in bilateral LEs to the knees. Superimposed erythemous skin changes in bilateral LEs with open wounds between toes bilaterally. These wounds are weeping serosanguinous fluids. There are eschar covered wounds on his great toes. Hands are erythematous. Musculoskeletal:  No deformities Skin: See ext exam above.  Neuro:  CNs 2-12 intact, no focal abnormalities noted Psych:  Normal affect   EKG:  The EKG was personally reviewed and demonstrates:  Sinus rhythm. PVC. Telemetry:  Telemetry was personally reviewed and demonstrates:  Sinus rhythm.  Relevant CV Studies:  Echocardiogram : 1. Left ventricular ejection fraction, by visual estimation, is 30 to 35%. The left ventricle has moderately decreased function. There is mildly increased left ventricular hypertrophy.  2. The left ventricle demonstrates global hypokinesis.  3. Indeterminate diastolic filling due to E-A fusion.  4. Global right ventricle has normal systolic function.The right ventricular size is normal. No increase in right ventricular wall thickness.  5. Left atrial size was mildly dilated.  6. Right atrial size was mildly dilated.  7. Trivial pericardial effusion is present.  8. Mild mitral annular calcification.  9. The mitral valve is normal in structure. Mild mitral valve regurgitation. No evidence of mitral stenosis.  10. The tricuspid valve is normal in structure. Tricuspid valve regurgitation is not demonstrated.  11. The aortic valve was not well visualized. Aortic valve regurgitation is not visualized. The aortic valve was calcified, unable to see well enough to  comment on leaflet number. I suspect there may be a degree of  aortic stenosis present but this study was inadequate to definitively interrogate the aortic valve. Would  consider TEE if concerned for significant aortic stenosis.  12. TR signal is inadequate for assessing pulmonary artery systolic pressure.  13. The inferior vena cava is dilated in size with >50% respiratory variability, suggesting right atrial pressure of 8 mmHg.   LHC 03/24/2019:  Heart Cath 03/24/2019 Findings: LM mild diffuse CAD, distal 30% stenosis LAD mild diffuse CAD, proximal 50% stenosis, 30% distal stenosis LCx co-dominant, mild diffuse CAD, distal collaterals to RPLV system RCA co-dominant, severe diffuse CAD, ostial 100% CTO   Laboratory Data:  Chemistry Recent Labs  Lab 04/26/20 1055 04/30/20 1222  NA 135 137  K 5.8* 4.2  CL 99 95*  CO2 19* 27  GLUCOSE 113* 114*  BUN 57* 65*  CREATININE 1.77* 1.93*  CALCIUM 10.0 9.6  GFRNONAA 36* 35*  GFRAA 42*  --   ANIONGAP  --  15    Total Protein  Date Value Ref Range Status  04/13/2019 7.4 6.5 - 8.1 g/dL Final   Albumin  Date Value Ref Range Status  04/13/2019 3.5 3.5 - 5.0 g/dL Final   AST  Date Value Ref Range Status  04/13/2019 8 (L) 15 - 41 U/L Final   ALT  Date Value Ref Range Status  04/13/2019 11 0 - 44 U/L Final   Alkaline Phosphatase  Date Value Ref Range Status  04/13/2019 78 38 - 126 U/L Final   Total Bilirubin  Date Value Ref Range Status  04/13/2019 0.5 0.3 - 1.2 mg/dL Final   Hematology Recent Labs  Lab 04/26/20 1055 04/30/20 1222  WBC 10.5 11.0*  RBC 4.99 5.60  HGB 15.3 16.9  HCT 44.9 51.1  MCV 90 91.3  MCH 30.7 30.2  MCHC 34.1 33.1  RDW 18.8* 19.9*  PLT 140* 133*   Cardiac EnzymesNo results for input(s): TROPONINI in the last 168 hours. No results for input(s): TROPIPOC in the last 168 hours.  BNP Recent Labs  Lab 04/30/20 1222  BNP 3,258.8*    DDimer No results for input(s): DDIMER in the last 168  hours. TSH: No results found for: TSH Lipids:No results found for: CHOL, HDL, LDLCALC, LDLDIRECT, TRIG, CHOLHDL HgbA1c:No results found for: HGBA1C  Radiology/Studies:  DG Chest 2 View  Result Date: 04/30/2020 CLINICAL DATA:  Shortness of breath and lower extremity edema 1 week. EXAM: CHEST - 2 VIEW COMPARISON:  09/08/2019 FINDINGS: Lungs are adequately inflated with hazy prominence of the perihilar vessels suggesting minimal vascular congestion. No significant effusion. Mild stable cardiomegaly. Remainder the exam is unchanged. IMPRESSION: Mild cardiomegaly with suggestion of minimal vascular congestion. Electronically Signed   By: Marin Olp M.D.   On: 04/30/2020 13:36   VAS [place] LOWER EXTREMITY VENOUS (DVT) (ONLY [facility])  Result Date: 04/30/2020  Lower Venous DVT Study Indications: Edema.  Comparison Study: no prior Performing Technologist: Abram Sander RVS  Examination Guidelines: A complete evaluation  includes B-mode imaging, spectral Doppler, color Doppler, and power Doppler as needed of all accessible portions of each vessel. Bilateral testing is considered an integral part of a complete examination. Limited examinations for reoccurring indications may be performed as noted. The reflux portion of the exam is performed with the patient in reverse Trendelenburg.  +-----+---------------+---------+-----------+----------+--------------+ RIGHTCompressibilityPhasicitySpontaneityPropertiesThrombus Aging +-----+---------------+---------+-----------+----------+--------------+ CFV  Full           Yes      Yes                                 +-----+---------------+---------+-----------+----------+--------------+   +---------+---------------+---------+-----------+----------+--------------+ LEFT     CompressibilityPhasicitySpontaneityPropertiesThrombus Aging +---------+---------------+---------+-----------+----------+--------------+ CFV      Full           Yes      Yes                                  +---------+---------------+---------+-----------+----------+--------------+ SFJ      Full                                                        +---------+---------------+---------+-----------+----------+--------------+ FV Prox  Full                                                        +---------+---------------+---------+-----------+----------+--------------+ FV Mid   Full                                                        +---------+---------------+---------+-----------+----------+--------------+ FV DistalFull                                                        +---------+---------------+---------+-----------+----------+--------------+ PFV      Full                                                        +---------+---------------+---------+-----------+----------+--------------+ POP      Full           Yes      Yes                                 +---------+---------------+---------+-----------+----------+--------------+ PTV      Full                                                        +---------+---------------+---------+-----------+----------+--------------+  PERO     Full                                                        +---------+---------------+---------+-----------+----------+--------------+     Summary: RIGHT: - No evidence of common femoral vein obstruction.  LEFT: - There is no evidence of deep vein thrombosis in the lower extremity.  - No cystic structure found in the popliteal fossa.  *See table(s) above for measurements and observations.    Preliminary    Assessment and Plan:   1. Acute on chronic CHF: -Patient has been seen on several occasions in the outpatient setting by his primary cardiologist where he is deferred ED admission on several occasions.  He was most recently seen 04/26/2020 with increasing shortness of breath and LE edema at which time plan was to increase his Lasix to 60 mg a.m. and 40  mg p.m. with the addition of metolazone once per day x7 days however lab work revealed an elevated creatinine above baseline therefore Lasix was resumed at previous dosing at 40 mg twice daily.  Family called on call answering service 04/29/2020 reporting LE edema with weeping wounds at which time they were referred to the ED however deferred therefore plan was to continue Lasix 40 mg twice daily and 2 doses of metolazone with close follow-up planned for 05/03/2020.  Unfortunately symptoms did not improve and he presented to the ED with worsening symptoms -BNP elevated at 3258 with CXR showing minimal vascular congestion -Creatinine elevated at 1.93 up from 1.77 on 11/17/202  -We will discuss IV diuretic plan with MD for final decision  2.  Acute on chronic kidney disease stage III: -Creatinine, 1.93 today up from 1.77 on 04/26/2020 with a baseline that appears to be in the 1.7-1.8 range -Follow renal response closely  3.  Ischemic cardiomyopathy: -LHC performed 03/24/2019 with mild diffuse left main with distal 30% stenosis, proximal 50% LAD, LCx with mild diffuse CAD with distal collaterals and a codominant RCA with severe diffuse CAD with ostial 100% CTO. -Most recent echocardiogram from 04/14/2020 with LVEF at 30 to 35% with mild LVH, global hypokinesis, trivial pericardial effusion, mild mitral annular calcification, mild MR and aortic calcification with possible some degree of aortic stenosis however an adequate study.  TEE recommended if concern for severe AS -Previously on Entresto, nitrates however these have been discontinued secondary to hypotension ischemic area vascular there was like antibiotics there is a localized fluid volume overloaded like other than his leg volume overload which is something up  4.  Ischemic bilateral lower extremities: -Vascular studies ordered 04/26/2020 out of concern for ischemic changes however these have not yet been performed -Vascular studies ordered this  admission with possible VVS consultation -Lower extremity ultrasound negative for DVT per ED report -Continue statin -Previously not on ASA in the setting of Eliquis  5.  History of DVT: -Previously treated with chronic Eliquis however this was discontinued in the setting of severe GI bleed requiring PRBCs resulting in cardiac arrest secondary to hypotension   For questions or updates, please contact [place] Please consult [email or web address] for contact info under Cardiology/STEMI.   Lyndel Safe NP-C [facility] Pager: [phone] 04/30/2020 4:09 PM     ------------------------------------------------------------------------------------  I have seen, examined the patient, and reviewed the above assessment and plan.  Patient seen and examined in the emergency department.  He is with his son-in-law.  The patient is blind.  He tells me that his swelling in his bilateral lower extremities has been worsening slowly.  It is now to the point where he is having trouble getting around his home.  He lives by himself after the recent passing of his wife.  Swelling in his legs is gone severe enough that now they are weeping bloody fluid.  He has wounds between his toes that are painful.  He has had sporadic chest pain for which she is taken nitroglycerin.  He estimates he is used nitroglycerin 1 time this week.  No syncope or presyncope.  His son-in-law tells me that the patient may have had chills and sweats over the last week.  Physical exam is significant for 2-3+ pitting edema in bilateral lower extremities to the knees.  There is a superimposed erythematous patchy rash that extends to his mid shin.  There are open wounds between each of his toes on bilateral lower extremities that are weeping serosanguineous fluid.  There are eschar covered wounds on his great toes and the dorsal aspect of bilateral feet.  His lungs were clear.  He is in sinus rhythm.  1.  Bilateral lower  extremity edema We will gently diurese with 40 mg IV Lasix twice daily with careful attention to his renal function.  2.  Skin breakdown on bilateral lower extremities We will consult vascular and wound care to assist in the management of his bilateral foot wounds.  3.  Disposition Patient lives alone and is blind.  May need to consider placement upon discharge to help manage his multiple medical comorbidities.  4.  Coronary artery disease Has known severe coronary disease with stable angina.  I do not think there is been a new ischemic insult that would explain his presentation.  We will continue his home medical regimen for now.   Vickie Epley, MD 04/30/2020 4:47 PM

## 2020-04-30 NOTE — ED Notes (Signed)
Tried calling report but per CN she hasn't review the chart yet and haven't accepted the patient.

## 2020-04-30 NOTE — Procedures (Signed)
Attempted echo for "anticipated discharge" bedside in ED, but patient is being moved to room on unit.

## 2020-04-30 NOTE — Hospital Course (Addendum)
Richard Townsend is a 79 y.o. male presenting with bilateral calf and foot swelling. 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   Subacute bilateral lower extremity edema 2/2 acute HFrEF exacerbation Patient presents with 2-week history of developing BLE edema, becoming more more painful.  Upon admission, patient had BNP of 3258.8 in addition to shiny bright red feet with open wounds, scattered black eschar, and 3+ pitting edema that obscured palpation of dorsal pedal and pretibial pulses (however pulses were dopplerable).  Some dyspnea but lungs clear to auscultation bilaterally.  Concern for acute worsening of HFrEF.  Cardiology consulted, recommended gentle diuresis with 40 mg IV Lasix twice daily.  Echo demonstrated worsening valvular disease but stable EF of 30 to 35%, congruent with echo last year.  Diuresis performed with Lasix only, metolazone was held.  Patient experienced marked improvement of BLE edema and dyspnea with diuresis, net output of -2,705 mL over admission.  Wound care was consulted and recommended daily care of the interdigital wounds using a silver hydrofiber cut into strips and placed between toes after cleansing and drying, a dry boot wrap from toes to knee is provided as well as bilateral pressure redistribution heel boots to mitigate edema and prevent pressure injury.  Vascular surgery consulted; offered aortogram with runoff studies, but patient declined citing it was too invasive.  Upon discharge, BLE edema greatly reduced and patient denies dyspnea.  Discharged with Lasix 40 mg twice daily and potassium supplement 10 mEq daily.  Intend close follow-up with cardiology.   CKD Stage 2 Creatinine upon admission 1.93.  Upon discharge, after diuresis, creatinine 1.88.  Unsure of actual creatinine baseline. Within the last year, has been measured as low as 1.18, as high as 2.21.  Data from 2013 0.75-0.84.  Admission  03/20/2019 at [facility] reported CKD stage II with EGFR around 50 and creatinine about 1.38. From data that we are able to see it appears as kidney function has worsened to CKD stage III with a baseline creatinine between 1.6 and 1.8.   Thrombocytopenia: acute, worsening Upon admission, platelets 133 which declined to 115 on day of discharge.  Last measured 140 on 04/26/2020. Baseline appears to be approximately 350. Patient had extensive dark red coalescing petechial rashes across his body with intermittently scattered black eschar.  See photos in media tab. These rashes were found on points of contact across the body, including bilateral shoulder blades, bilateral posterior upper arms, and elbows.  Initially, concern for hepatic dysfunction but both PT and PTT were within normal limits.  Suggest repeat measurement at outpatient PCP follow-up.

## 2020-04-30 NOTE — ED Provider Notes (Signed)
[facility] EMERGENCY DEPARTMENT Provider Note   CSN: [id number] Arrival date & time: 04/30/20  1155    History Chief Complaint  Patient presents with  . Leg Swelling   Richard Townsend is a 79 y.o. male with past medical history significant for AAA, DVT, GI bleed, Heart failure with reduced EF, ischemic cardiomyopathy, legal blindness, dysmetabolic syndrome who presents for evaluation of lower extremity edema.  Increased lower extremity edema over the last few days.  Seen by cardiology on 04/26/2020.  Was told to increase Lasix and add metolazone.  His labs came back at that time and noted an increase in his creatinine.  Was told to decrease animal Lasix and remove the metolazone.  Currently patient has had increased swelling.  Was noted to have ischemic wounds to his toes at his last cardiology visit.  They referred him outpatient to vascular surgery and ordered outpatient arterial Doppler..  To be fluid overloaded on 04/26/2020.  He admits to some dyspnea with exertion however states he has some of this chronically.  He is unsure if this is worsened.  Follow-up with cardiology in 05/03/2020.  He has noted some weeping wounds between his toes.  These will occasionally bleed.  Has noted diffuse pruritus which she states is chronic in nature.  He says he sleeps anywhere from 1-12 pillows at night.  He is unable to tell me, his output recently.  Does admit that he cannot lay flat at baseline.  Denies fever, chills, nausea, vomiting, chest pain, abdominal pain, diarrhea, dysuria, paresthesias, weakness.  Denies additional aggravating or alleviating factors.  History obtained from patient and past medical records. No interpretor was used.  Per son in law in room patient with multiple falls because " my feet give out." Patient with increasing pain to BL lower extremities.  Family does state the patient has had overall decreased p.o. intake to depression from wife passing away  approximately 6 months ago.  HPI     Past Medical History:  Diagnosis Date  . AAA (abdominal aortic aneurysm) ([place]) 11/2011  . Abdominal aneurysm without mention of rupture 01/06/2012  . Abdominal tightness 01/11/2013  . Allergic rhinitis   . Arthritis   . DVT (deep venous thrombosis) ([place])   . Dysmetabolic syndrome   . Gastritis and gastroduodenitis   . Generalized osteoarthritis   . GERD (gastroesophageal reflux disease)   . GI bleed 04/13/2019  . GIB (gastrointestinal bleeding) 04/13/2019  . Gout   . Headache(784.0)   . HFrEF (heart failure with reduced ejection fraction) ([place]) 08/06/2019  . History of GI bleed 08/06/2019  . Hypercholesterolemia   . Hypertension   . Ischemic cardiomyopathy 08/06/2019  . Lower extremity edema   . Melena 04/13/2019  . Mixed hyperlipidemia 08/06/2019  . Nausea alone 07/13/2013  . 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 Peptic ulcer   . Shortness of breath   . Symptomatic anemia 04/13/2019  . UGI bleed     Patient Active Problem List   Diagnosis Date Noted  . CHF (congestive heart failure) ([place]) 04/30/2020  . Essential hypertension 11/13/2019  . Mild CAD 11/13/2019  . Shortness of breath 09/28/2019  . Ischemic cardiomyopathy 08/06/2019  . HFrEF (heart failure with reduced ejection fraction) ([place]) 08/06/2019  . Mixed hyperlipidemia 08/06/2019  . History of GI bleed 08/06/2019  . Gastritis and gastroduodenitis   . Melena 04/13/2019  . GIB (gastrointestinal bleeding) 04/13/2019  . Symptomatic anemia 04/13/2019  .  DVT (deep venous thrombosis) ([place]) 04/13/2019  . GI bleed 04/13/2019  . Nausea alone 07/13/2013  . AAA (abdominal aortic aneurysm) ([place]) 01/11/2013  . 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 Abdominal tightness 01/11/2013  . Abdominal aneurysm without mention of rupture 01/06/2012    Past Surgical History:  Procedure  Laterality Date  . ABDOMINAL AORTIC ANEURYSM REPAIR  11/28/11   stent   . ABDOMINAL AORTIC ANEURYSM REPAIR    . APPENDECTOMY    . BIOPSY  04/15/2019   Procedure: BIOPSY;  Surgeon: Thornton Park, MD;  Location: [facility];  Service: Gastroenterology;;  . cataracts    . ESOPHAGOGASTRODUODENOSCOPY (EGD) WITH PROPOFOL N/A 04/15/2019   Procedure: ESOPHAGOGASTRODUODENOSCOPY (EGD) WITH PROPOFOL;  Surgeon: Thornton Park, MD;  Location: [facility];  Service: Gastroenterology;  Laterality: N/A;  . EYE SURGERY  2009   Left cataract  . 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 FOOT SURGERY    . IR IVC FILTER PLMT / S&I /IMG GUID/MOD SED  04/14/2019  . KNEE ARTHROSCOPY    . TONSILLECTOMY         Family History  Problem Relation Age of Onset  . Cancer Mother   . Hypertension Mother   . Leukemia Mother   . Prostate cancer Father   . Cancer Sister   . Hypertension Sister   . Hypertension Brother   . Hypertension Daughter   . Heart attack Maternal Grandfather     Social History   Tobacco Use  . Smoking status: Current Every Day Smoker    Packs/day: 0.25    Years: 51.00    Pack years: 12.75    Types: Cigarettes  . Smokeless tobacco: Never Used  Vaping Use  . Vaping Use: Never used  Substance Use Topics  . Alcohol use: No  . Drug use: No    Home Medications Prior to Admission medications   Medication Sig Start Date End Date Taking? Authorizing Provider  acetaminophen (TYLENOL) 325 MG tablet Take 650 mg by mouth every 6 (six) hours as needed for mild pain.    Yes [provider]  albuterol (VENTOLIN HFA) 108 (90 Base) MCG/ACT inhaler Inhale 2 puffs into the lungs daily as needed for wheezing or shortness of breath.  10/30/19  Yes [provider]  allopurinol (ZYLOPRIM) 300 MG tablet Take 300 mg by mouth daily.  06/24/12  Yes [provider]  atorvastatin (LIPITOR) 20 MG tablet Take 20 mg by mouth daily.    Yes [provider]  beta  carotene w/minerals (OCUVITE) tablet Take 1 tablet by mouth daily.   Yes [provider]  famotidine (PEPCID) 40 MG tablet Take 40 mg by mouth every evening.  02/15/20  Yes [provider]  ferrous sulfate 324 MG TBEC Take 324 mg by mouth daily with breakfast.   Yes [provider]  furosemide (LASIX) 40 MG tablet Take 1 tablet (40 mg total) by mouth 2 (two) times daily. 11/12/19  Yes Tobb, Kardie, DO  gabapentin (NEURONTIN) 300 MG capsule Take 300 mg by mouth at bedtime.  07/19/19  Yes [provider]  HYDROcodone-acetaminophen (NORCO/VICODIN) 5-325 MG tablet Take 1 tablet by mouth every 6 (six) hours as needed for moderate pain.  04/20/20  Yes [provider]  isosorbide mononitrate (IMDUR) 60 MG 24 hr tablet TAKE 1 TABLET(60 MG) BY MOUTH DAILY Patient taking differently: Take 60 mg by mouth daily.  02/16/20  Yes Tobb,  Kardie, DO  metolazone (ZAROXOLYN) 5 MG tablet Take 1 tablet 30 minutes prior to taking your morning Lasix. Take for 7 days. Patient taking differently: Take 5 mg by mouth See admin instructions. Take 1 tablet (5mg ) 30 minutes prior to taking your morning Lasix. 04/26/20  Yes Tobb, Kardie, DO  metoprolol succinate (TOPROL-XL) 25 MG 24 hr tablet TAKE 1/2 TABLET(12.5 MG) BY MOUTH DAILY Patient taking differently: Take 12.5 mg by mouth daily.  10/15/19  Yes Tobb, Kardie, DO  nitroGLYCERIN (NITROSTAT) 0.4 MG SL tablet Place 1 tablet (0.4 mg total) under the tongue every 5 (five) minutes as needed. Patient taking differently: Place 0.4 mg under the tongue every 5 (five) minutes as needed for chest pain.  08/20/19 04/30/20 Yes Tobb, Kardie, DO  pantoprazole (PROTONIX) 40 MG tablet Take 40 mg by mouth 2 (two) times daily.   Yes [provider]  potassium chloride SA (KLOR-CON) 20 MEQ tablet Take 1 tablet (20 mEq total) by mouth 2 (two) times daily. 11/12/19  Yes Tobb, Kardie, DO  sucralfate (CARAFATE) 1 g tablet Take 1 g by mouth 4 (four) times daily  -  with meals and at bedtime.   Yes [provider]  SYMBICORT 160-4.5 MCG/ACT inhaler Inhale 2 puffs into the lungs daily as needed (for asthma).  10/21/19  Yes [provider]    Allergies    Lisinopril  Review of Systems   Review of Systems  Constitutional: Negative.   HENT: Negative.   Respiratory: Positive for shortness of breath. Negative for apnea, cough, choking, chest tightness, wheezing and stridor.   Cardiovascular: Positive for leg swelling. Negative for chest pain and palpitations.  Gastrointestinal: Negative.   Genitourinary: Negative.   Musculoskeletal: Negative.   Skin: Positive for wound.  Neurological: Negative.   All other systems reviewed and are negative.   Physical Exam Updated Vital Signs BP (!) 131/92   Pulse 81   Temp 97.9 F (36.6 C) (Oral)   Resp (!) 26   SpO2 93%   Physical Exam Vitals and nursing note reviewed.  Constitutional:      General: He is not in acute distress.    Appearance: He is well-developed. He is ill-appearing (Chronically ill appearing). He is not toxic-appearing or diaphoretic.  HENT:     Head: Normocephalic and atraumatic.     Nose: Nose normal.     Mouth/Throat:     Mouth: Mucous membranes are moist.  Eyes:     Pupils: Pupils are equal, round, and reactive to light.  Cardiovascular:     Rate and Rhythm: Normal rate and regular rhythm.     Pulses:          Radial pulses are 2+ on the right side and 2+ on the left side.       Dorsalis pedis pulses are detected w/ Doppler on the right side and detected w/ Doppler on the left side.     Heart sounds: Normal heart sounds.  Pulmonary:     Effort: Pulmonary effort is normal. No respiratory distress.     Breath sounds: Normal breath sounds.     Comments: Minimal bibasilar crackles, clear with cough. Speaks in full sentences without difficulty however with tachypnea Abdominal:     General: Bowel sounds are normal. There is no distension.     Palpations:  Abdomen is soft.     Tenderness: There is no abdominal tenderness. There is no right CVA tenderness, left CVA tenderness, guarding or rebound.  Musculoskeletal:  General: Normal range of motion.     Cervical back: Normal range of motion and neck supple.     Comments: No bony tenderness.  Moves all 4 extremities at difficulty.  3+ pitting edema to knees on left and 2+ on right.  Wiggles toes without difficulty  Skin:    General: Skin is warm and dry.     Capillary Refill: Capillary refill takes 2 to 3 seconds.     Comments: Erythema significant swelling to bilateral feet. No warmth. Wound to left dorsal foot. He has bleeding, weeping wounds between all of his toes. Petechiae and various stages of ecchymosis to chest, abd, BL upper and lower extremities  Neurological:     General: No focal deficit present.     Mental Status: He is alert and oriented to person, place, and time.     Comments: Intact sensation. Ambulatory in room         ED Results / Procedures / Treatments   Labs (all labs ordered are listed, but only abnormal results are displayed) Labs Reviewed  CBC WITH DIFFERENTIAL/PLATELET - Abnormal; Notable for the following components:      Result Value   WBC 11.0 (*)    RDW 19.9 (*)    Platelets 133 (*)    Neutro Abs 8.8 (*)    Abs Immature Granulocytes 0.08 (*)    All other components within normal limits  BASIC METABOLIC PANEL - Abnormal; Notable for the following components:   Chloride 95 (*)    Glucose, Bld 114 (*)    BUN 65 (*)    Creatinine, Ser 1.93 (*)    GFR, Estimated 35 (*)    All other components within normal limits  BRAIN NATRIURETIC PEPTIDE - Abnormal; Notable for the following components:   B Natriuretic Peptide 3,258.8 (*)    All other components within normal limits  RESPIRATORY PANEL BY RT PCR (FLU A&B, COVID)  PROTIME-INR  APTT    EKG None  Radiology DG Chest 2 View  Result Date: 04/30/2020 CLINICAL DATA:  Shortness of breath and  lower extremity edema 1 week. EXAM: CHEST - 2 VIEW COMPARISON:  09/08/2019 FINDINGS: Lungs are adequately inflated with hazy prominence of the perihilar vessels suggesting minimal vascular congestion. No significant effusion. Mild stable cardiomegaly. Remainder the exam is unchanged. IMPRESSION: Mild cardiomegaly with suggestion of minimal vascular congestion. Electronically Signed   By: Marin Olp M.D.   On: 04/30/2020 13:36   VAS [place] LOWER EXTREMITY VENOUS (DVT) (ONLY [facility] & [facility])  Result Date: 04/30/2020  Lower Venous DVT Study Indications: Edema.  Comparison Study: no prior Performing Technologist: Abram Sander RVS  Examination Guidelines: A complete evaluation includes B-mode imaging, spectral Doppler, color Doppler, and power Doppler as needed of all accessible portions of each vessel. Bilateral testing is considered an integral part of a complete examination. Limited examinations for reoccurring indications may be performed as noted. The reflux portion of the exam is performed with the patient in reverse Trendelenburg.  +-----+---------------+---------+-----------+----------+--------------+ RIGHTCompressibilityPhasicitySpontaneityPropertiesThrombus Aging +-----+---------------+---------+-----------+----------+--------------+ CFV  Full           Yes      Yes                                 +-----+---------------+---------+-----------+----------+--------------+   +---------+---------------+---------+-----------+----------+--------------+ LEFT     CompressibilityPhasicitySpontaneityPropertiesThrombus Aging +---------+---------------+---------+-----------+----------+--------------+ CFV      Full           Yes  Yes                                 +---------+---------------+---------+-----------+----------+--------------+ SFJ      Full                                                        +---------+---------------+---------+-----------+----------+--------------+ FV Prox   Full                                                        +---------+---------------+---------+-----------+----------+--------------+ FV Mid   Full                                                        +---------+---------------+---------+-----------+----------+--------------+ FV DistalFull                                                        +---------+---------------+---------+-----------+----------+--------------+ PFV      Full                                                        +---------+---------------+---------+-----------+----------+--------------+ POP      Full           Yes      Yes                                 +---------+---------------+---------+-----------+----------+--------------+ PTV      Full                                                        +---------+---------------+---------+-----------+----------+--------------+ PERO     Full                                                        +---------+---------------+---------+-----------+----------+--------------+     Summary: RIGHT: - No evidence of common femoral vein obstruction.  LEFT: - There is no evidence of deep vein thrombosis in the lower extremity.  - No cystic structure found in the popliteal fossa.  *See table(s) above for measurements and observations.    Preliminary     Procedures Procedures (including critical care time)  Medications Ordered in ED Medications  furosemide (LASIX) injection 60 mg (60 mg Intravenous Given 04/30/20  1431)    ED Course  I have reviewed the triage vital signs and the nursing notes.  Pertinent labs & imaging results that were available during my care of the patient were reviewed by me and considered in my medical decision making (see chart for details).   78 year old presents for evaluation of lower extremity edema.  He is afebrile, nonseptic however appears chronically ill.  At some point has DVT with IVC filter, recently removed from  anticoagulation due to recurrent GI bleed with anemia.  His left lower extremity is slightly larger than his right.  He denies any chest pain.  Does have some shortness of breath at baseline he is unsure if this is worsened.  He has some orthopnea which he states he has chronically.  His abdomen is soft, nontender.  He has Doppler pulses bilaterally.  Does have significant erythema and swelling to lower extremities.  He does have ischemic wounds to his left lower extremity which is similar to when cardiology saw him a few days ago.  They placed outpatient referral to vascular.  Apparently have had difficulty increasing his Lasix and metolazone due to AKI.  Plan on labs, imaging and reassess  Labs and imaging personally viewed and interpreted:  CBC leukocytosis at 26.2 Metabolic panel with glucose 114, BUN 65, creatinine 1.93 up from 1.77 4 days ago however baseline 2 months ago 1.118 DG chest with cardiomegaly and pulmonary vascular congestion US DVT negative for DVT EKG with multiple PVC INR 1.1  Patient reassessed.  Son-in-law in room states patient is on multiple times due to patient stating his feet just "give out on me" because they are so swollen.  He has pain to this area.  Sounds like family and patient are frustrated that they have been increasing his Lasix without significant resolve and are now having kidney abnormalities as a result.  His ultrasound is reassuring with no evidence of DVT.  Does have wounds consistent with ischemic wounds to his left lower extremity however he has Doppler pulses.  He has no evidence of acute complete leg ischemia at this time however will likely need further work-up. Low suspicion for osteomyelitis as cause of his leg wounds. Does have appointment with vascular surgery on Wednesday.  He does appear dyspneic on exam.  Chest x-ray does show some vascular congestion.  Given worsening CHF likely causing LE edema and worsening kidney function will admit for further  management and monitoring.  We will give small dose of IV Lasix here in ED.  Will hold on additional fluids to admitting team.  Patient denies any current pain or need for any analgesia.   CONSULT with FM teaching who agrees to evaluate patient for admission.  The patient appears reasonably stabilized for admission considering the current resources, flow, and capabilities available in the ED at this time, and I doubt any other [facility] requiring further screening and/or treatment in the ED prior to admission.   Patient seen eval by attending, Dr. Sabra Heck who agrees with above treatment, plan and disposition.   MDM Rules/Calculators/A&P                           Final Clinical Impression(s) / ED Diagnoses Final diagnoses:  Acute on chronic congestive heart failure, unspecified heart failure type ([place])  Frequent falls  Petechiae    Rx / DC Orders ED Discharge Orders    None       Nevaeh Korte A, PA-C  04/30/20 1517    Noemi Chapel, MD 05/01/20 [phone]

## 2020-04-30 NOTE — ED Triage Notes (Signed)
Pt arrives to ED via pov with chief complaint of bilateral leg swelling over the last 2 weeks. Pt reports significant history including cardiac arrest in sept 2020. Pt does have shortness of breather with exertion. Denies any CP.

## 2020-04-30 NOTE — H&P (Addendum)
[facility] Admission History and Physical Service Pager: [phone]  Patient name: Richard Townsend Medical record number: [id number] Date of birth: July 15, 1940 Age: 79 y.o. Gender: male  Primary Care Provider: Ernestene Kiel, MD Consultants: Cardiology, wound care, vascular surgery Code Status: DNR Preferred Emergency Contact: [name] Long, son-in-law, [phone]   Chief Complaint: Foot swelling and pain  Assessment and Plan: Richard Townsend is a 79 y.o. male presenting with bilateral calf and foot swelling. 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   Subacute bilateral lower extremity edema Patient presents with 2-week history of developing BLE edema, becoming more more painful.  Cardiologist started changing medication regimen last Thursday, 11/18.  They also placed vascular consult and ordered ABIs and an echocardiogram to be completed in the outpatient setting.  Upon admission, patient had BNP of 3258.8 in addition to shiny bright red feet with open wounds, scattered black eschar, and 3+ pitting edema that obscured palpation of dorsal pedal and pretibial pulses (however pulses were dopplerable).  Some dyspnea with faint crackles in right lung base but lungs were otherwise clear to auscultation..  Differential diagnosis acute worsening of HFrEF versus cardiorenal syndrome. -Admit to med telemetry with Dr. Erin Hearing attending [facility] cardiology, appreciate recommendations -Patient received 60 mg IV Lasix in the emergency department, will reassess BMP and redose Lasix as indicated -Wound care consulted regarding lower extremity eschar and draining -Vascular surgery consult in the morning -Strict I's and O's -Daily weights -Follow-up a.m. CMP, CBC  HFrEF  At home, prescribed Imdur 60 mg daily, metoprolol 25 mg daily, nitroglycerin as needed, metolazone 5 mg daily, and Lasix 40 mg  twice daily.  S/p 60 mg IV Lasix 60 mg IV in ED at 1431.  Last echo 04/15/2019 demonstrated EF 30 to 35%, mildly increased LV hypertrophy, LV global hypokinesis. -Consulted cardiology, appreciate recommendations -Follow-up echo -Evaluate response to Lasix -Will dose Lasix with respect to volume status and kidney function -Continue Imdur and metoprolol -Will consider nitroglycerin in future if need arises -Currently holding metolazone -Strict I's and O's -Daily weights  AKI  CKD Stage 2 Creatinine admission 1.93.  Last measured 1.77 on 11/17.  Unsure of baseline, however likely to be 1.7-1.8.  Within the last year, has been measured as low as 1.18, as high as 2.21.  Data from 2013 0.75-0.84.  Admission 03/20/2019 at [facility] reported CKD stage II with EGFR around 50 and creatinine about 1.38. -Follow daily creatinine -Consider consult nephro if creatinine worsens  Thrombocytopenia Upon admission, platelets 133.  Last measured 140 on 04/26/2020. Baseline appears to be approximately 350. Patient had extensive dark red coalescing petechial rashes across his body with intermittently scattered black eschar.  See photos in media tab. These rashes were found on points of contact across the body, including bilateral shoulder blades, bilateral posterior upper arms, and elbows.  Initially, concern for hepatic dysfunction but both PT and PTT were within normal limits. -Follow-up morning CBC, CMP  CAD  hx MI  hx AAA s/p repair  DVT s/p IVC Takes atorvastatin 20 mg daily at home. Heart cath at [facility] 03/24/2019 demonstrated LM mild diffuse CAD, distal 30% stenosis; LAD mild diffuse CAD, proximal 50% stenosis, 30% distal stenosis; LCx co-dominant, mild diffuse CAD, distal collaterals to RPLV system; RCA co-dominant, severe diffuse CAD, ostial 100% CTO. Previously on Eliquis but taken off after large GI bleed.  IVC placed 04/2019.  -Continue atorvastatin -DVT  prophylaxis with renally dosed  Lovenox  Hx GI bleed  GERD  Hx stomach ulcer Patient previously taken off Eliquis after large GI bleed.  Will provide DVT prophylaxis in form of Lovenox, adjusted for creatinine clearance.  Takes famotidine 40 mg, ferrous sulfate 324 mg, Protonix 40 mg twice daily at home.  Previously took sucralfate 1 g 4 times daily until he noticed gynecomastia and breast tenderness, was told to discontinue. -Continue home doses Pepcid, p.o. iron, Protonix.  COPD On Symbicort 2 puffs daily and albuterol as needed at home. Richard Townsend inpatient due to formulary -Continue albuterol inhaler as needed  Hyperlipidemia Takes atorvastatin 20 mg daily at home. -Continue home meds  Gout Takes allopurinol 300 mg daily at home. -Continue home meds  Tobacco use Smokes pack a day. -Provide nicotine patch 7 mg daily (had nausea with patches in past) -Will increase as necessary   FEN/GI: Regular diet Prophylaxis: Lovenox  Disposition: Med telemetry  History of Present Illness:  Richard Townsend is a 79 y.o. male presenting with 2 weeks of gradually worsening bilateral foot swelling and pain.  Began with pain and swelling of left great toe patient initially believed to be a gout flare.  Developed into swelling, redness, and pain of left foot then eventually both feet.  The swelling has caused him to fall several times in the past few days.  Several spots on his feet have begun weeping.  Also endorses "a little" shortness of breath, "but nothing much more than normal".  His cardiologist began tweaking diuretics last Thursday, 11/18.  Previous to that, patient and his son-in-law at bedside report no changes to meds within the last 6 months.  Additionally, he takes nitro to help with anginal chest pain, has used it 3 times in the past 2 weeks.  He says that sometimes he "starts panicking and then gets chest pain and the nitro helps it".  Previously was on sucralfate for history of GI bleed and large ulcer but  has not stopped it because of developing gynecomastia and breast tenderness; switch to famotidine.  Patient also endorses reduced appetite with postprandial nausea and emesis for the last "little while".  He says that his wife passed away in 02/24/23 and has had issues with being down depressed since then.  He believes that his current appetite changes with postprandial nausea and emesis are similar to the ones his wife experienced at the end of her life.  When asked about his code status, he was reasonable and thoughtful, but elected for DNR stating "I am ready to go be with [my wife], I do not want to be a burden on the young ones anymore".   Review Of Systems: Per HPI with the following additions:   Review of Systems  Constitutional: Positive for appetite change.  Respiratory: Positive for shortness of breath.   Skin: Positive for wound.     Patient Active Problem List   Diagnosis Date Noted  . Essential hypertension 11/13/2019  . Mild CAD 11/13/2019  . Shortness of breath 09/28/2019  . Ischemic cardiomyopathy 08/06/2019  . HFrEF (heart failure with reduced ejection fraction) ([place]) 08/06/2019  . Mixed hyperlipidemia 08/06/2019  . History of GI bleed 08/06/2019  . Gastritis and gastroduodenitis   . Melena 04/13/2019  . GIB (gastrointestinal bleeding) 04/13/2019  . Symptomatic anemia 04/13/2019  . DVT (deep venous thrombosis) ([place]) 04/13/2019  . GI bleed 04/13/2019  . Nausea alone 07/13/2013  . AAA (abdominal aortic aneurysm) ([place]) 01/11/2013  . Occlusion  and stenosis of carotid artery without mention of cerebral infarction 01/11/2013  . Abdominal tightness 01/11/2013  . Abdominal aneurysm without mention of rupture 01/06/2012    Past Medical History: Past Medical History:  Diagnosis Date  . AAA (abdominal aortic aneurysm) ([place]) 11/2011  . Abdominal aneurysm without mention of rupture 01/06/2012  . Abdominal tightness 01/11/2013  . Allergic rhinitis   . Arthritis   . DVT (deep  venous thrombosis) ([place])   . Dysmetabolic syndrome   . Gastritis and gastroduodenitis   . Generalized osteoarthritis   . GERD (gastroesophageal reflux disease)   . GI bleed 04/13/2019  . GIB (gastrointestinal bleeding) 04/13/2019  . Gout   . Headache(784.0)   . HFrEF (heart failure with reduced ejection fraction) ([place]) 08/06/2019  . History of GI bleed 08/06/2019  . Hypercholesterolemia   . Hypertension   . Ischemic cardiomyopathy 08/06/2019  . Lower extremity edema   . Melena 04/13/2019  . Mixed hyperlipidemia 08/06/2019  . Nausea alone 07/13/2013  . 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 Peptic ulcer   . Shortness of breath   . Symptomatic anemia 04/13/2019  . UGI bleed     Past Surgical History: Past Surgical History:  Procedure Laterality Date  . ABDOMINAL AORTIC ANEURYSM REPAIR  11/28/11   stent   . ABDOMINAL AORTIC ANEURYSM REPAIR    . APPENDECTOMY    . BIOPSY  04/15/2019   Procedure: BIOPSY;  Surgeon: Thornton Park, MD;  Location: [facility] ENDOSCOPY;  Service: Gastroenterology;;  . cataracts    . ESOPHAGOGASTRODUODENOSCOPY (EGD) WITH PROPOFOL N/A 04/15/2019   Procedure: ESOPHAGOGASTRODUODENOSCOPY (EGD) WITH PROPOFOL;  Surgeon: Thornton Park, MD;  Location: [facility] ENDOSCOPY;  Service: Gastroenterology;  Laterality: N/A;  . EYE SURGERY  2009   Left cataract  . 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 FOOT SURGERY    . IR IVC FILTER PLMT / S&I /IMG GUID/MOD SED  04/14/2019  . KNEE ARTHROSCOPY    . TONSILLECTOMY      Social History: Social History   Tobacco Use  . Smoking status: Current Every Day Smoker    Packs/day: 0.25    Years: 51.00    Pack years: 12.75    Types: Cigarettes  . Smokeless tobacco: Never Used  Vaping Use  . Vaping Use: Never used  Substance Use Topics  . Alcohol use: No  . Drug use: No   Additional social history: Lost one son to drugs.   Please also refer to relevant sections of  EMR.  Family History: Family History  Problem Relation Age of Onset  . Cancer Mother   . Hypertension Mother   . Leukemia Mother   . Prostate cancer Father   . Cancer Sister   . Hypertension Sister   . Hypertension Brother   . Hypertension Daughter   . Heart attack Maternal Grandfather     Allergies and Medications: Allergies  Allergen Reactions  . Lisinopril Cough   No current facility-administered medications on file prior to encounter.   Current Outpatient Medications on File Prior to Encounter  Medication Sig Dispense Refill  . acetaminophen (TYLENOL) 325 MG tablet Take 650 mg by mouth every 6 (six) hours as needed for mild pain.     Marland Kitchen albuterol (VENTOLIN HFA) 108 (90 Base) MCG/ACT inhaler Inhale 2 puffs into the lungs daily as needed for wheezing or shortness of breath.     . allopurinol (ZYLOPRIM) 300 MG tablet Take 300  mg by mouth daily.     Marland Kitchen atorvastatin (LIPITOR) 20 MG tablet Take 20 mg by mouth daily.     . beta carotene w/minerals (OCUVITE) tablet Take 1 tablet by mouth daily.    . famotidine (PEPCID) 40 MG tablet Take 40 mg by mouth every evening.     . ferrous sulfate 324 MG TBEC Take 324 mg by mouth daily with breakfast.    . furosemide (LASIX) 40 MG tablet Take 1 tablet (40 mg total) by mouth 2 (two) times daily. 180 tablet 3  . gabapentin (NEURONTIN) 300 MG capsule Take 300 mg by mouth at bedtime.     Marland Kitchen HYDROcodone-acetaminophen (NORCO/VICODIN) 5-325 MG tablet Take 1 tablet by mouth every 6 (six) hours as needed for moderate pain.     . isosorbide mononitrate (IMDUR) 60 MG 24 hr tablet TAKE 1 TABLET(60 MG) BY MOUTH DAILY (Patient taking differently: Take 60 mg by mouth daily. ) 90 tablet 2  . metolazone (ZAROXOLYN) 5 MG tablet Take 1 tablet 30 minutes prior to taking your morning Lasix. Take for 7 days. (Patient taking differently: Take 5 mg by mouth See admin instructions. Take 1 tablet (71m) 30 minutes prior to taking your morning Lasix.) 7 tablet 0  .  metoprolol succinate (TOPROL-XL) 25 MG 24 hr tablet TAKE 1/2 TABLET(12.5 MG) BY MOUTH DAILY (Patient taking differently: Take 12.5 mg by mouth daily. ) 45 tablet 2  . nitroGLYCERIN (NITROSTAT) 0.4 MG SL tablet Place 1 tablet (0.4 mg total) under the tongue every 5 (five) minutes as needed. (Patient taking differently: Place 0.4 mg under the tongue every 5 (five) minutes as needed for chest pain. ) 30 tablet 3  . pantoprazole (PROTONIX) 40 MG tablet Take 40 mg by mouth 2 (two) times daily.    . potassium chloride SA (KLOR-CON) 20 MEQ tablet Take 1 tablet (20 mEq total) by mouth 2 (two) times daily. 180 tablet 3  . sucralfate (CARAFATE) 1 g tablet Take 1 g by mouth 4 (four) times daily -  with meals and at bedtime.    . SYMBICORT 160-4.5 MCG/ACT inhaler Inhale 2 puffs into the lungs daily as needed (for asthma).       Objective: BP 122/83 (BP Location: Left Arm)   Pulse 79   Temp 97.9 F (36.6 C) (Oral)   Resp (!) 25   SpO2 99%  Exam: General: Elderly frail-appearing male, alert and oriented Eyes: EOM intact ENTM: Dry cracked lips Cardiovascular: Regular rate and rhythm, S1 and S2 auscultated, no murmurs appreciated Respiratory: Superior anterior fields clear, inferior anterior fields with very mild rhonchi Gastrointestinal: Soft, nondistended, no TTP, overlying dark red coalescing petechial rashes MSK: Able to move all extremities equally, able to sit up and throw legs up her bed Derm: dark red coalescing petechial rashes with tiny black eschars scattered throughout overlying many areas (overlying posterior bilateral shoulder blades, bilateral posterior upper arms, elbows, abdomen) see photos attached in media tab for more Neuro: Moving all extremities spontaneously, cranial nerves II through X grossly intact Psych: Normal speech, regular insight and judgment             Labs and Imaging: CBC BMET  Recent Labs  Lab 04/30/20 1222  WBC 11.0*  HGB 16.9  HCT 51.1  PLT 133*    Recent Labs  Lab 04/30/20 1222  NA 137  K 4.2  CL 95*  CO2 27  BUN 65*  CREATININE 1.93*  GLUCOSE 114*  CALCIUM 9.6  EKG: Sinus rhythm, normal axis, no ST elevation, QRS moderately widened but within normal range  CXR 2 view 11/21 FINDINGS: Lungs are adequately inflated with hazy prominence of the perihilar vessels suggesting minimal vascular congestion. No significant effusion. Mild stable cardiomegaly. Remainder the exam is unchanged. IMPRESSION: Mild cardiomegaly with suggestion of minimal vascular congestion.  Bilateral lower extremity ultrasound 11/21 Summary:  RIGHT:  - No evidence of common femoral vein obstruction.  LEFT:  - There is no evidence of deep vein thrombosis in the lower extremity.  - No cystic structure found in the popliteal fossa.   ECHO pending at this time.   Ezequiel Essex, MD 04/30/2020, 5:25 PM PGY-1, [place] Intern pager: [phone], text pages welcome  FPTS Upper-Level Resident Addendum   I have independently interviewed and examined the patient. I have discussed the above with the original author and agree with their documentation. My edits for correction/addition/clarification are in blue.Please see also any attending notes.   Gifford Shave, MD PGY-2, [place] [facility] 04/30/2020 5:52 PM  [place] [employer] Service pager: [phone] (text pages welcome through [place])

## 2020-05-01 ENCOUNTER — Observation Stay (HOSPITAL_BASED_OUTPATIENT_CLINIC_OR_DEPARTMENT_OTHER): Payer: Medicare HMO

## 2020-05-01 ENCOUNTER — Other Ambulatory Visit: Payer: Self-pay

## 2020-05-01 DIAGNOSIS — R600 Localized edema: Secondary | ICD-10-CM | POA: Insufficient documentation

## 2020-05-01 DIAGNOSIS — I5020 Unspecified systolic (congestive) heart failure: Secondary | ICD-10-CM | POA: Diagnosis not present

## 2020-05-01 DIAGNOSIS — I509 Heart failure, unspecified: Secondary | ICD-10-CM | POA: Diagnosis not present

## 2020-05-01 DIAGNOSIS — M79605 Pain in left leg: Secondary | ICD-10-CM | POA: Diagnosis not present

## 2020-05-01 DIAGNOSIS — I340 Nonrheumatic mitral (valve) insufficiency: Secondary | ICD-10-CM | POA: Diagnosis not present

## 2020-05-01 DIAGNOSIS — L0390 Cellulitis, unspecified: Secondary | ICD-10-CM | POA: Diagnosis not present

## 2020-05-01 DIAGNOSIS — I739 Peripheral vascular disease, unspecified: Secondary | ICD-10-CM | POA: Diagnosis not present

## 2020-05-01 DIAGNOSIS — I351 Nonrheumatic aortic (valve) insufficiency: Secondary | ICD-10-CM | POA: Diagnosis not present

## 2020-05-01 DIAGNOSIS — M159 Polyosteoarthritis, unspecified: Secondary | ICD-10-CM | POA: Insufficient documentation

## 2020-05-01 DIAGNOSIS — I10 Essential (primary) hypertension: Secondary | ICD-10-CM | POA: Insufficient documentation

## 2020-05-01 DIAGNOSIS — I350 Nonrheumatic aortic (valve) stenosis: Secondary | ICD-10-CM | POA: Diagnosis not present

## 2020-05-01 DIAGNOSIS — K279 Peptic ulcer, site unspecified, unspecified as acute or chronic, without hemorrhage or perforation: Secondary | ICD-10-CM | POA: Insufficient documentation

## 2020-05-01 DIAGNOSIS — J309 Allergic rhinitis, unspecified: Secondary | ICD-10-CM | POA: Insufficient documentation

## 2020-05-01 DIAGNOSIS — E8881 Metabolic syndrome: Secondary | ICD-10-CM | POA: Insufficient documentation

## 2020-05-01 DIAGNOSIS — I714 Abdominal aortic aneurysm, without rupture: Secondary | ICD-10-CM | POA: Diagnosis not present

## 2020-05-01 DIAGNOSIS — K219 Gastro-esophageal reflux disease without esophagitis: Secondary | ICD-10-CM | POA: Insufficient documentation

## 2020-05-01 DIAGNOSIS — M1990 Unspecified osteoarthritis, unspecified site: Secondary | ICD-10-CM | POA: Insufficient documentation

## 2020-05-01 DIAGNOSIS — I2510 Atherosclerotic heart disease of native coronary artery without angina pectoris: Secondary | ICD-10-CM | POA: Diagnosis not present

## 2020-05-01 DIAGNOSIS — E7800 Pure hypercholesterolemia, unspecified: Secondary | ICD-10-CM | POA: Insufficient documentation

## 2020-05-01 DIAGNOSIS — I5043 Acute on chronic combined systolic (congestive) and diastolic (congestive) heart failure: Secondary | ICD-10-CM | POA: Diagnosis not present

## 2020-05-01 DIAGNOSIS — M79604 Pain in right leg: Secondary | ICD-10-CM | POA: Diagnosis not present

## 2020-05-01 DIAGNOSIS — K922 Gastrointestinal hemorrhage, unspecified: Secondary | ICD-10-CM | POA: Insufficient documentation

## 2020-05-01 DIAGNOSIS — R0602 Shortness of breath: Secondary | ICD-10-CM

## 2020-05-01 DIAGNOSIS — M109 Gout, unspecified: Secondary | ICD-10-CM | POA: Insufficient documentation

## 2020-05-01 LAB — CBC
HCT: 47.1 % (ref 39.0–52.0)
Hemoglobin: 15.6 g/dL (ref 13.0–17.0)
MCH: 29.5 pg (ref 26.0–34.0)
MCHC: 33.1 g/dL (ref 30.0–36.0)
MCV: 89.2 fL (ref 80.0–100.0)
Platelets: 122 10*3/uL — ABNORMAL LOW (ref 150–400)
RBC: 5.28 MIL/uL (ref 4.22–5.81)
RDW: 19 % — ABNORMAL HIGH (ref 11.5–15.5)
WBC: 9.8 10*3/uL (ref 4.0–10.5)
nRBC: 0 % (ref 0.0–0.2)

## 2020-05-01 LAB — ECHOCARDIOGRAM COMPLETE
AR max vel: 0.68 cm2
AV Area VTI: 0.72 cm2
AV Area mean vel: 0.72 cm2
AV Mean grad: 16 mmHg
AV Peak grad: 27.2 mmHg
Ao pk vel: 2.61 m/s
Calc EF: 33 %
MV M vel: 4.71 m/s
MV Peak grad: 88.7 mmHg
P 1/2 time: 357 msec
S' Lateral: 4.6 cm
Single Plane A2C EF: 21.8 %
Single Plane A4C EF: 42.2 %
Weight: 2465.62 oz

## 2020-05-01 LAB — BASIC METABOLIC PANEL
Anion gap: 15 (ref 5–15)
BUN: 62 mg/dL — ABNORMAL HIGH (ref 8–23)
CO2: 27 mmol/L (ref 22–32)
Calcium: 8.7 mg/dL — ABNORMAL LOW (ref 8.9–10.3)
Chloride: 92 mmol/L — ABNORMAL LOW (ref 98–111)
Creatinine, Ser: 1.88 mg/dL — ABNORMAL HIGH (ref 0.61–1.24)
GFR, Estimated: 36 mL/min — ABNORMAL LOW (ref >60)
Glucose, Bld: 175 mg/dL — ABNORMAL HIGH (ref 70–99)
Potassium: 3.3 mmol/L — ABNORMAL LOW (ref 3.5–5.1)
Sodium: 134 mmol/L — ABNORMAL LOW (ref 135–145)

## 2020-05-01 LAB — COMPREHENSIVE METABOLIC PANEL
ALT: 103 U/L — ABNORMAL HIGH (ref 0–44)
AST: 26 U/L (ref 15–41)
Albumin: 3.1 g/dL — ABNORMAL LOW (ref 3.5–5.0)
Alkaline Phosphatase: 120 U/L (ref 38–126)
Anion gap: 14 (ref 5–15)
BUN: 60 mg/dL — ABNORMAL HIGH (ref 8–23)
CO2: 30 mmol/L (ref 22–32)
Calcium: 9.1 mg/dL (ref 8.9–10.3)
Chloride: 93 mmol/L — ABNORMAL LOW (ref 98–111)
Creatinine, Ser: 1.77 mg/dL — ABNORMAL HIGH (ref 0.61–1.24)
GFR, Estimated: 39 mL/min — ABNORMAL LOW (ref >60)
Glucose, Bld: 94 mg/dL (ref 70–99)
Potassium: 2.9 mmol/L — ABNORMAL LOW (ref 3.5–5.1)
Sodium: 137 mmol/L (ref 135–145)
Total Bilirubin: 2.4 mg/dL — ABNORMAL HIGH (ref 0.3–1.2)
Total Protein: 6.1 g/dL — ABNORMAL LOW (ref 6.5–8.1)

## 2020-05-01 LAB — MAGNESIUM: Magnesium: 1.5 mg/dL — ABNORMAL LOW (ref 1.7–2.4)

## 2020-05-01 MED ORDER — POTASSIUM CHLORIDE CRYS ER 20 MEQ PO TBCR
40.0000 meq | EXTENDED_RELEASE_TABLET | Freq: Once | ORAL | Status: AC
  Administered 2020-05-01: 40 meq via ORAL
  Filled 2020-05-01: qty 2

## 2020-05-01 MED ORDER — POTASSIUM CHLORIDE CRYS ER 20 MEQ PO TBCR: 40.0000 meq | EXTENDED_RELEASE_TABLET | Freq: Once | ORAL | Status: DC

## 2020-05-01 MED ORDER — FUROSEMIDE 40 MG PO TABS
40.0000 mg | ORAL_TABLET | Freq: Two times a day (BID) | ORAL | Status: DC
  Administered 2020-05-01 – 2020-05-02 (×2): 40 mg via ORAL
  Filled 2020-05-01 (×2): qty 1

## 2020-05-01 MED ORDER — MAGNESIUM SULFATE 2 GM/50ML IV SOLN
2.0000 g | Freq: Once | INTRAVENOUS | Status: AC
  Administered 2020-05-01: 2 g via INTRAVENOUS
  Filled 2020-05-01: qty 50

## 2020-05-01 NOTE — Progress Notes (Signed)
MD informed of asymptomatic runs of V tach. PO potassium ordered.

## 2020-05-01 NOTE — Consult Note (Addendum)
[facility] Nurse Consult Note: [facility] Nurse consulted when patient arrived to floor; consult performed while patient was in ED yesterday afternoon. Please see note from that encounter.  Dr. Jeani Hawking and I discussed patient via [place] and I have made the recommendation that Podiatric Medicine be consulted for additional input into POC for LE.  WOC nursing team will not follow, but will remain available to this patient, the nursing and medical teams.  Please re-consult if needed. Thanks, Maudie Flakes, MSN, RN, [place], Arther Abbott  Pager# [phone]

## 2020-05-01 NOTE — Progress Notes (Addendum)
Occupational Therapy Evaluation Patient Details Name: Richard Townsend MRN: [id number] DOB: June 10, 1941 Today's Date: 05/01/2020    History of Present Illness Pt is a 79 y.o. M with significant PMH of CAD, HTN, cardiomyopathy, HLD, and history of GI bleed who presents for evaluation of CHF.    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 Pt currently limited with safe ADLs engagement and functional transfers due to pain and some weakness. Pt will benefit from continued acute OT to address established deficits to ensure maximize independence of ADLs with compensatory strategies, exercises, safety awareness, and AE education. DC recommendation to [facility] with continued family support.     Follow Up Recommendations  Home health OT;Supervision - Intermittent    Equipment Recommendations  None recommended by OT    Recommendations for Other Services       Precautions / Restrictions Precautions Precautions: Fall;Other (comment) Precaution Comments: blind Restrictions Weight Bearing Restrictions: No      Mobility Bed Mobility Overal bed mobility: Needs Assistance Bed Mobility: Supine to Sit;Sit to Supine     Supine to sit: Supervision     General bed mobility comments: Supervision for safety, HOB elevated, cues for sitting up    Transfers Overall transfer level: Needs assistance Equipment used: Rolling walker (2 wheeled) Transfers: Sit to/from Stand Sit to Stand: Min guard;Min assist         General transfer comment: Cues for hand placement. Min guard from edge of bed, minA to push to stand with RW.    Balance Overall balance assessment: Needs assistance Sitting-balance support: Feet supported Sitting balance-Leahy Scale: Good     Standing balance support: No upper extremity supported;During functional activity Standing balance-Leahy  Scale: Fair                             ADL either performed or assessed with clinical judgement   ADL Overall ADL's : Needs assistance/impaired Eating/Feeding: Set up;Sitting   Grooming: Wash/dry face;Oral care;Set up Grooming Details (indicate cue type and reason): mostly positioned in front with good lighting due to poor vision. Upper Body Bathing: Modified independent;Sitting   Lower Body Bathing: Modified independent;Sitting/lateral leans   Upper Body Dressing : Modified independent;Sitting;Set up Upper Body Dressing Details (indicate cue type and reason): Pt is able to [facility] RUE with LUE due to hx of RTC injury. Lower Body Dressing: Min guard Lower Body Dressing Details (indicate cue type and reason): Pt able to present BLE to figure 4 position to don sock, limitations with donning R sock readjustment of sock. Toilet Transfer: Minimal assistance;Cueing for safety;Cueing for sequencing;Ambulation;RW Toilet Transfer Details (indicate cue type and reason): simulated toilet transfer from bed to recliner. Heavy cueing for navigating room and set up for AE due to low vision.         Functional mobility during ADLs: Minimal assistance General ADL Comments: mostly guidance for safety awareness and navigating functional environment.      Vision         Perception     Praxis      Pertinent Vitals/Pain Pain Assessment: 0-10 Faces Pain Scale: Hurts whole lot Pain Location: bilateral feet Pain Descriptors / Indicators: Burning Pain Intervention(s): Premedicated before session;Monitored during session     Hand Dominance Right   Extremity/Trunk Assessment Upper Extremity Assessment Upper  Extremity Assessment: Generalized weakness   Lower Extremity Assessment Lower Extremity Assessment: Defer to PT evaluation   Cervical / Trunk Assessment Cervical / Trunk Assessment: Kyphotic   Communication Communication Communication: No difficulties   Cognition  Arousal/Alertness: Awake/alert Behavior During Therapy: WFL for tasks assessed/performed Overall Cognitive Status: Within Functional Limits for tasks assessed                                     General Comments       Exercises     Shoulder Instructions      Home Living Family/patient expects to be discharged to:: Private residence Living Arrangements: Alone Available Help at Discharge: Family;Available PRN/intermittently (2 sisters live close by, daughter, son in law) Type of Home: House Home Access: Ramped entrance     Home Layout: One level     Bathroom Shower/Tub: Occupational psychologist: Handicapped height Bathroom Accessibility: Yes   Home Equipment: Shower seat - built in;Hand held Tourist information centre manager - 2 wheels;Cane - single point;Wheelchair - manual;Bedside commode   Additional Comments: Pt and son in law at bed side reports family support for all IADLs      Prior Functioning/Environment Level of Independence: Needs assistance  Gait / Transfers Assistance Needed: Uses cane intermittently ADL's / Homemaking Assistance Needed: Assist for IADL's including finances, cooking            OT Problem List: Decreased activity tolerance;Impaired balance (sitting and/or standing);Impaired vision/perception;Decreased knowledge of use of DME or AE;Decreased safety awareness;Pain      OT Treatment/Interventions: Self-care/ADL training;Therapeutic exercise;Energy conservation;DME and/or AE instruction;Therapeutic activities;Visual/perceptual remediation/compensation;Patient/family education;Balance training    OT Goals(Current goals can be found in the care plan section) Acute Rehab OT Goals Patient Stated Goal: less pain and swelling OT Goal Formulation: With patient Time For Goal Achievement: 05/15/20 Potential to Achieve Goals: Fair  OT Frequency: Min 2X/week   Barriers to D/C:            Co-evaluation              AM-PAC OT "6  Clicks" Daily Activity     Outcome Measure Help from another person eating meals?: A Little Help from another person taking care of personal grooming?: A Little Help from another person toileting, which includes using toliet, bedpan, or urinal?: A Little Help from another person bathing (including washing, rinsing, drying)?: A Little Help from another person to put on and taking off regular upper body clothing?: None Help from another person to put on and taking off regular lower body clothing?: None (mostly set up required due to low vision. ) 6 Click Score: 20   End of Session Equipment Utilized During Treatment: Gait belt;Rolling walker Nurse Communication: Mobility status  Activity Tolerance: Patient limited by pain Patient left: with call bell/phone within reach;in chair;with chair alarm set;with family/visitor present  OT Visit Diagnosis: Unsteadiness on feet (R26.81);Muscle weakness (generalized) (M62.81);Pain;Low vision, both eyes (H54.2) Pain - part of body: Ankle and joints of foot                Time: 8546-2703 OT Time Calculation (min): 17 min Charges:  OT General Charges $OT Visit: 1 Visit OT Evaluation $OT Eval Low Complexity: [place], MSOT, OTR/L  Supplemental Rehabilitation Services  [phone]   Marius Ditch 05/01/2020, 2:08 PM

## 2020-05-01 NOTE — Evaluation (Signed)
Physical Therapy Evaluation Patient Details Name: Richard Townsend MRN: [id number] DOB: [birth year] Today's Date: 05/01/2020   History of Present Illness  Pt is a 79 y.o. M with significant PMH of CAD, HTN, cardiomyopathy, HLD, and history of GI bleed who presents for evaluation of CHF.   Clinical Impression  Prior to admission, pt lives alone in a ramped entrance home; has good family support who assists with IADL's. Pt is also blind. Pt presents with decreased functional mobility secondary to bilateral foot pain, weakness and balance deficits. Pt ambulating 25 feet with a walker at a min guard assist level; cues provided for environmental negotiation. Would benefit from HHPT at discharge to maximize functional independence.     Follow Up Recommendations Home health PT;Supervision/Assistance - 24 hour    Equipment Recommendations  None recommended by PT (has needed DME)   Recommendations for Other Services       Precautions / Restrictions Precautions Precautions: Fall;Other (comment) Precaution Comments: blind Restrictions Weight Bearing Restrictions: No      Mobility  Bed Mobility Overal bed mobility: Needs Assistance Bed Mobility: Supine to Sit;Sit to Supine     Supine to sit: Supervision     General bed mobility comments: Supervision for safety, HOB elevated, cues for sitting up    Transfers Overall transfer level: Needs assistance Equipment used: Rolling walker (2 wheeled) Transfers: Sit to/from Stand Sit to Stand: Min guard;Min assist         General transfer comment: Cues for hand placement. Min guard from edge of bed, minA from low toilet height  Ambulation/Gait Ambulation/Gait assistance: Min guard Gait Distance (Feet): 25 Feet Assistive device: Rolling walker (2 wheeled) Gait Pattern/deviations: Step-through pattern;Decreased stride length;Decreased dorsiflexion - left;Decreased dorsiflexion - right;Trunk flexed Gait velocity: decreased Gait velocity  interpretation: <1.8 ft/sec, indicate of risk for recurrent falls General Gait Details: Cues for environmental negotiation, min guard for safety   Stairs            Wheelchair Mobility    Modified Rankin (Stroke Patients Only)       Balance Overall balance assessment: Needs assistance Sitting-balance support: Feet supported Sitting balance-Leahy Scale: Good     Standing balance support: No upper extremity supported;During functional activity Standing balance-Leahy Scale: Fair Standing balance comment: Washing hands at sink with supervision                             Pertinent Vitals/Pain Pain Assessment: Faces Faces Pain Scale: Hurts whole lot Pain Location: bilateral feet Pain Descriptors / Indicators: Burning Pain Intervention(s): Monitored during session;Limited activity within patient's tolerance;Patient requesting pain meds-RN notified    Home Living Family/patient expects to be discharged to:: Private residence Living Arrangements: Alone Available Help at Discharge: Family;Available PRN/intermittently (2 sisters live close by, daughter, son in law) Type of Home: House Home Access: Ramped entrance     Home Layout: One level Home Equipment: Shower seat - built in;Hand held Tourist information centre manager - 2 wheels;Cane - single point;Wheelchair - manual      Prior Function Level of Independence: Needs assistance   Gait / Transfers Assistance Needed: Uses cane intermittently  ADL's / Homemaking Assistance Needed: Assist for IADL's including finances, cooking        Hand Dominance        Extremity/Trunk Assessment   Upper Extremity Assessment Upper Extremity Assessment: Defer to OT evaluation    Lower Extremity Assessment Lower Extremity Assessment: Generalized weakness (interdigital wounds)  Cervical / Trunk Assessment Cervical / Trunk Assessment: Kyphotic  Communication   Communication: No difficulties  Cognition Arousal/Alertness:  Awake/alert Behavior During Therapy: WFL for tasks assessed/performed Overall Cognitive Status: Within Functional Limits for tasks assessed                                        General Comments General comments (skin integrity, edema, etc.): VSS on RA    Exercises     Assessment/Plan    PT Assessment Patient needs continued PT services  PT Problem List Decreased strength;Decreased activity tolerance;Decreased balance;Decreased mobility;Pain       PT Treatment Interventions DME instruction;Gait training;Functional mobility training;Therapeutic activities;Therapeutic exercise;Balance training;Patient/family education    PT Goals (Current goals can be found in the Care Plan section)  Acute Rehab PT Goals Patient Stated Goal: less pain and swelling PT Goal Formulation: With patient Time For Goal Achievement: 05/15/20 Potential to Achieve Goals: Good    Frequency Min 3X/week   Barriers to discharge        Co-evaluation               AM-PAC PT "6 Clicks" Mobility  Outcome Measure Help needed turning from your back to your side while in a flat bed without using bedrails?: None Help needed moving from lying on your back to sitting on the side of a flat bed without using bedrails?: None Help needed moving to and from a bed to a chair (including a wheelchair)?: A Little Help needed standing up from a chair using your arms (e.g., wheelchair or bedside chair)?: A Little Help needed to walk in hospital room?: A Little Help needed climbing 3-5 steps with a railing? : A Lot 6 Click Score: 19    End of Session Equipment Utilized During Treatment: Gait belt Activity Tolerance: Patient tolerated treatment well Patient left: in bed;with call bell/phone within reach;with bed alarm set Nurse Communication: Mobility status PT Visit Diagnosis: Unsteadiness on feet (R26.81);Muscle weakness (generalized) (M62.81);Pain Pain - Right/Left:  (both) Pain - part of body:  Ankle and joints of foot    Time: 9937-1696 PT Time Calculation (min) (ACUTE ONLY): 30 min   Charges:   PT Evaluation $PT Eval Moderate Complexity: 1 Mod PT Treatments $Therapeutic Activity: 8-22 mins        Wyona Almas, PT, DPT Acute Rehabilitation Services Pager [phone] Office [phone]   Deno Etienne 05/01/2020, 9:58 AM

## 2020-05-01 NOTE — Progress Notes (Signed)
Progress Note  Patient Name: Richard Townsend Date of Encounter: 05/01/2020  Primary Cardiologist: [name], DO   Subjective   79 y.o. male with a hx of CAD per [facility] 03/24/2019 showing mild distal 30% left main, proximal LAD 50% with codominant RCA with severe diffuse CAD with ostial 100% CTO, HLD HTN, Cardiomyopathy EF 25-30%, DVT on anticoagulation with Eliquis 5 mg twice daily, and history of GI bleed with prior PEA arrest seen in consultation 05/01/20. Provider Note:  Patient is blind.  Patient notes that he is doing better.  Breathing has improved.  Still has leg Pain (ABI's done today ~ 0.7 bilaterally).  No SOB.  Inpatient Medications    Scheduled Meds:  allopurinol  300 mg Oral Daily   atorvastatin  20 mg Oral Daily   enoxaparin (LOVENOX) injection  40 mg Subcutaneous Q24H   famotidine  40 mg Oral QPM   furosemide  40 mg Intravenous BID   gabapentin  300 mg Oral QHS   influenza vaccine adjuvanted  0.5 mL Intramuscular Tomorrow-1000   isosorbide mononitrate  60 mg Oral Daily   metoprolol succinate  12.5 mg Oral Daily   mometasone-formoterol  2 puff Inhalation BID   multivitamin  1 tablet Oral Daily   nicotine  7 mg Transdermal Daily   pantoprazole  40 mg Oral BID   sodium chloride flush  3 mL Intravenous Q12H   Continuous Infusions:  sodium chloride     PRN Meds: sodium chloride, acetaminophen, albuterol, ondansetron (ZOFRAN) IV, oxyCODONE, sodium chloride flush   Vital Signs    Vitals:   05/01/20 0106 05/01/20 0350 05/01/20 0805 05/01/20 0813  BP:  (!) 109/58  118/82  Pulse:  83  90  Resp:  18    Temp:  98.8 F (37.1 C)    TempSrc:  Oral    SpO2:  95% 94%   Weight: 69.9 kg       Intake/Output Summary (Last 24 hours) at 05/01/2020 1037 Last data filed at 05/01/2020 0800 Gross per 24 hour  Intake 970 ml  Output 2525 ml  Net -1555 ml   Filed Weights   05/01/20 0106  Weight: 69.9 kg    Telemetry    SR with frequent monomorphic PVCs -  Personally Reviewed  ECG    SR 1st HB rate PVC and IVCD; evidence suggesting LVH 04/30/20 - Personally Reviewed  Physical Exam   GEN: No acute distress.   Neck: No JVD Cardiac: RRR, Both III/VI and II/VI diastolic murmurs, rubs, or gallops.  Respiratory: Clear to auscultation bilaterally. GI: Soft, nontender, non-distended  MS: Bilateral leg wounds well wrapped without exudate Neuro:  Nonfocal  Psych: Normal affect   Labs    Chemistry Recent Labs  Lab 04/26/20 1055 04/26/20 1055 04/30/20 1222 04/30/20 2146 05/01/20 0435  NA 135  --  137 136 137  K 5.8*   < > 4.2 2.9* 2.9*  CL 99   < > 95* 92* 93*  CO2 19*   < > 27 28 30   GLUCOSE 113*  --  114* 138* 94  BUN 57*  --  65* 63* 60*  CREATININE 1.77*   < > 1.93* 1.65* 1.77*  CALCIUM 10.0   < > 9.6 9.3 9.1  PROT  --   --   --  6.3* 6.1*  ALBUMIN  --   --   --  3.0* 3.1*  AST  --   --   --  26 26  ALT  --   --   --  105* 103*  ALKPHOS  --   --   --  121 120  BILITOT  --   --   --  2.0* 2.4*  GFRNONAA 36*  --  35* 43* 39*  GFRAA 42*  --   --   --   --   ANIONGAP  --   --  15 16* 14   < > = values in this interval not displayed.     Hematology Recent Labs  Lab 04/26/20 1055 04/30/20 1222 05/01/20 0435  WBC 10.5 11.0* 9.8  RBC 4.99 5.60 5.28  HGB 15.3 16.9 15.6  HCT 44.9 51.1 47.1  MCV 90 91.3 89.2  MCH 30.7 30.2 29.5  MCHC 34.1 33.1 33.1  RDW 18.8* 19.9* 19.0*  PLT 140* 133* 122*    Cardiac EnzymesNo results for input(s): TROPONINI in the last 168 hours. No results for input(s): TROPIPOC in the last 168 hours.   BNP Recent Labs  Lab 04/30/20 1222  BNP 3,258.8*     DDimer No results for input(s): DDIMER in the last 168 hours.   Radiology    DG Chest 2 View  Result Date: 04/30/2020 CLINICAL DATA:  Shortness of breath and lower extremity edema 1 week. EXAM: CHEST - 2 VIEW COMPARISON:  09/08/2019 FINDINGS: Lungs are adequately inflated with hazy prominence of the perihilar vessels suggesting minimal  vascular congestion. No significant effusion. Mild stable cardiomegaly. Remainder the exam is unchanged. IMPRESSION: Mild cardiomegaly with suggestion of minimal vascular congestion. Electronically Signed   By: Marin Olp M.D.   On: 04/30/2020 13:36   VAS [place] LOWER EXTREMITY VENOUS (DVT) (ONLY [facility] & [facility])  Result Date: 04/30/2020  Lower Venous DVT Study Indications: Edema.  Comparison Study: no prior Performing Technologist: Abram Sander RVS  Examination Guidelines: A complete evaluation includes B-mode imaging, spectral Doppler, color Doppler, and power Doppler as needed of all accessible portions of each vessel. Bilateral testing is considered an integral part of a complete examination. Limited examinations for reoccurring indications may be performed as noted. The reflux portion of the exam is performed with the patient in reverse Trendelenburg.  +-----+---------------+---------+-----------+----------+--------------+ RIGHTCompressibilityPhasicitySpontaneityPropertiesThrombus Aging +-----+---------------+---------+-----------+----------+--------------+ CFV  Full           Yes      Yes                                 +-----+---------------+---------+-----------+----------+--------------+   +---------+---------------+---------+-----------+----------+--------------+ LEFT     CompressibilityPhasicitySpontaneityPropertiesThrombus Aging +---------+---------------+---------+-----------+----------+--------------+ CFV      Full           Yes      Yes                                 +---------+---------------+---------+-----------+----------+--------------+ SFJ      Full                                                        +---------+---------------+---------+-----------+----------+--------------+ FV Prox  Full                                                        +---------+---------------+---------+-----------+----------+--------------+  FV Mid   Full                                                         +---------+---------------+---------+-----------+----------+--------------+ FV DistalFull                                                        +---------+---------------+---------+-----------+----------+--------------+ PFV      Full                                                        +---------+---------------+---------+-----------+----------+--------------+ POP      Full           Yes      Yes                                 +---------+---------------+---------+-----------+----------+--------------+ PTV      Full                                                        +---------+---------------+---------+-----------+----------+--------------+ PERO     Full                                                        +---------+---------------+---------+-----------+----------+--------------+     Summary: RIGHT: - No evidence of common femoral vein obstruction.  LEFT: - There is no evidence of deep vein thrombosis in the lower extremity.  - No cystic structure found in the popliteal fossa.  *See table(s) above for measurements and observations. Electronically signed by Deitra Mayo MD on 04/30/2020 at 6:20:59 PM.    Final     Cardiac Studies   Echo 05/01/20: Personally reviewed EF 25% at best At least mild AI At least Mild AS; LV SVI < 35; cannot excluded PLFLGAS Moderate MR two jets Mild to Moderate MR  Patient Profile     79 y.o. 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 25% AKI On CKD Stage III LE Wounds present on admission Hx of DVT Mitral, triscuspid, and aortic regurgitation Aortic Stenosis- at least mild - will transition to lasix 40 mg BID PO today (order placed), creatinine is coming up but within his 1.7-1.9 range - renally dosing medications - hx of entresto intolerance intolerance (symptomatic hypotension)    - vascular studies performed but pending - continue eliquis for DVT hx  -Continue statin  - overall long term care options limited by kidney function - wound care consult for legs- appreciate recs  Coronary Artery Disease; Obstructive - asymptomatic  - anatomy: [facility] 03/24/2019 showing mild distal 30% left main, proximal LAD 50% with codominant RCA with severe diffuse CAD with ostial 100% CTO - continue AC - continue statin - continue metoprolol 12.5 Mg daily has this and HF - continue Imdur 60 mg  Peripheral Arterial Disease - symptomatic - Would continue AC as above -No Tobacco Use  - Unable to trial cilostazol 100 mg BID due to HFrEF - Meets indication for Cardiovascular rehab - Reasonable to refer to vascular surgery though options may be limited in the setting of kidney function   For questions or updates, please contact [facility] Please consult [email or web address] for contact info under Cardiology/STEMI.      Signed, Werner Lean, MD  05/01/2020, 10:37 AM

## 2020-05-01 NOTE — Progress Notes (Signed)
VASCULAR LAB    ABIs have been performed.  See CV proc for preliminary results.   Jewelia Bocchino, RVT 05/01/2020, 12:20 PM

## 2020-05-01 NOTE — Progress Notes (Signed)
  Echocardiogram 2D Echocardiogram has been performed.  Richard Townsend 05/01/2020, 10:46 AM

## 2020-05-01 NOTE — Progress Notes (Signed)
Family Medicine Teaching Service Daily Progress Note Intern Pager: [phone]  Patient name: Richard Townsend Medical record number: [id number] Date of birth: 05-Feb-1941 Age: 79 y.o. Gender: male  Primary Care Provider: Ernestene Kiel, MD Consultants: Cardiology, wound care Code Status: DNR  Pt Overview and Major Events to Date:  Admitted 04/30/2020  Assessment and Plan: Richard Townsend is a 79 y.o. male presenting with bilateral calf and foot swelling. 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   Subacute bilateral lower extremity edema BLE edema greatly improved this morning with Lasix, wound care recommended wraps, pressure reducing boots and elevated foot of bed.  Patient still reports foot pain, mainly in toes, right worse than left.  Tolerated PT this morning well despite foot pain. Vascular surgery consulted, recommend aortogram, lower extremity arteriogram via transfemoral access (given suspected infrainguinal disease), however patient declined as he is not interested in any invasive procedures.  Patient also declined outpatient follow-up in 2 to 3 weeks.  Lastly, patient declined smoking cessation discussion.  Vascular ordered EVAR duplex to assess any changes in aneurysm size. -Cardiology recommends gentle diuresis with 40 mg IV Lasix twice daily -Wound care consulted, recommendations in place -Echo this morning showed EF stable from last echo 30 to 35%, tricuspid and mitral regurg -ABI study showed moderate bilateral extremity arterial disease, ABI left 0.69, right 0.70 -Follow-up EVAR duplex from vascular -Strict I's and O's -Daily weights -Follow-up a.m. CMP, CBC  HFrEF  Patient has no complaints overnight.  Physical exam demonstrates lungs clear to auscultation in bilateral anterior fields. -Cardiology recommendations as above -Echo as above -Continue Imdur and metoprolol -Will consider  nitroglycerin in future if need arises -Strict I's and O's -Daily weights  AKI   CKD Stage 2 Creatinine slightly worsened this morning, today 1.77 up from 1.65 yesterday.  This back to baseline however 1.7-1.8.  Prior to admission, most recent creatinine 1.77 on 11/17. -Follow daily creatinine -Consider consult nephro if creatinine worsens  Thrombocytopenia Worsening thrombocytopenia, this morning platelets 122 (down from 133 yesterday).  Prior to admission, last measured 140 on 06/27/2019. -Follow-up morning CBC, CMP  CAD   hx MI   hx AAA s/p repair   DVT s/p IVC Takes atorvastatin 20 mg daily at home. Patient previously taken off Eliquis after large GI bleed.  Will provide DVT prophylaxis in form of Lovenox, adjusted for creatinine clearance.  -Continue atorvastatin -DVT prophylaxis with renally dosed Lovenox  Hx GI bleed   GERD   Hx stomach ulcer Patient previously taken off Eliquis after large GI bleed.  Will provide DVT prophylaxis in form of Lovenox, adjusted for creatinine clearance.  Takes famotidine 40 mg, ferrous sulfate 324 mg, Protonix 40 mg twice daily at home.   -Continue home doses Pepcid, p.o. iron, Protonix.  COPD On Symbicort 2 puffs daily and albuterol as needed at home. Richard Townsend inpatient due to formulary -Continue albuterol inhaler as needed  Hyperlipidemia Takes atorvastatin 20 mg daily at home. -Continue home meds  Gout Takes allopurinol 300 mg daily at home. -Continue home meds  Tobacco use Smokes pack a day. -Provide nicotine patch 7 mg daily (had nausea with patches in past) -Will increase as necessary   FEN/GI: Regular diet Prophylaxis: Lovenox   Status is: Observation  The patient remains OBS appropriate and will d/c before 2 midnights.  Dispo: The patient is from: Home  Anticipated d/c is to: TBD pending consults               Anticipated d/c date is: 2 days              Patient currently is not medically  stable to d/c.    Subjective:  Patient was found awake in room, walking with PT.  Appear to be in good spirits.  Awake, alert, oriented.  He has no complaints aside from bilateral foot pain, mostly in his toes.  Tolerated PT well despite foot pain.  He is very pleased with the reduction in foot and ankle edema since admission.  Objective: Temp:  [97.8 F (36.6 C)-99 F (37.2 C)] 98.8 F (37.1 C) (11/22 0350) Pulse Rate:  [77-92] 90 (11/22 0813) Resp:  [14-26] 18 (11/22 0350) BP: (109-151)/(58-99) 118/82 (11/22 0813) SpO2:  [93 %-100 %] 94 % (11/22 0805) Weight:  [69.9 kg] 69.9 kg (11/22 0106)  Physical Exam: General: Awake, alert, oriented Cardiovascular: Regular rate and rhythm, S1 and S2 auscultated, no murmurs appreciated Respiratory: Clear to auscultation bilaterally, no rales or crackles (improved from yesterday) Abdomen: Soft, nontender, nondistended Extremities: BLE edema greatly improved, calves and ankles significantly decreased, feet still edematous, BLE wrapped with dry wrap from toes to calf per wound care recs  Laboratory: Recent Labs  Lab 04/26/20 1055 04/30/20 1222 05/01/20 0435  WBC 10.5 11.0* 9.8  HGB 15.3 16.9 15.6  HCT 44.9 51.1 47.1  PLT 140* 133* 122*   Recent Labs  Lab 04/30/20 1222 04/30/20 2146 05/01/20 0435  NA 137 136 137  K 4.2 2.9* 2.9*  CL 95* 92* 93*  CO2 27 28 30   BUN 65* 63* 60*  CREATININE 1.93* 1.65* 1.77*  CALCIUM 9.6 9.3 9.1  PROT  --  6.3* 6.1*  BILITOT  --  2.0* 2.4*  ALKPHOS  --  121 120  ALT  --  105* 103*  AST  --  26 26  GLUCOSE 114* 138* 94    Imaging/Diagnostic Tests:  ECHO complete 05/01/2020 IMPRESSIONS  1. Left ventricular ejection fraction, by estimation, is 30 to 35%. The  left ventricle has moderately decreased function. The left ventricle  demonstrates global hypokinesis. There is moderate left ventricular  hypertrophy. Left ventricular diastolic  function could not be evaluated.  2. Right ventricular  systolic function is normal. The right ventricular  size is normal. There is normal pulmonary artery systolic pressure. The  estimated right ventricular systolic pressure is 16.1 mmHg.  3. Left atrial size was moderately dilated.  4. Right atrial size was moderately dilated.  5. The mitral valve is abnormal. Mild mitral valve regurgitation.  Moderate mitral annular calcification. Reduced excursion of the anterior  mitral leaflet due to posteriorly directed AI, causing at probably mild  mitral stenosis.  6. Tricuspid valve regurgitation is moderate.  7. The aortic valve is calcified. Aortic valve regurgitation is mild to  moderate. The AI jet is posteriorly directed and impair anterior excursion  of the mitral leaflet. Moderate to severe aortic valve stenosis. Aortic  valve area, by VTI measures 0.72  cm. Aortic valve mean gradient measures 16 mmHg. Aortic valve Vmax  measures 2.61 m/s. DI is 0.21.  8. The inferior vena cava is normal in size with greater than 50%  respiratory variability, suggesting right atrial pressure of 3 mmHg.   Comparison(s): Changes from prior study are noted. 04/15/2019: LVEF 30-35%,  aortic stenosis could not be definitively evaluated.   ABI study 05/01/2020 -preliminary  result  ABI/TBI Today's ABI Today's TBI Previous ABI Previous TBI   +-------+-----------+-----------+------------+------------+   Right  0.69                            +-------+-----------+-----------+------------+------------+   Left   0.70                            +-------+-----------+-----------+------------+------------+   No prior study on file.   Summary:  Right: Resting right ankle-brachial index indicates moderate right lower  extremity arterial disease.  Left: Resting left ankle-brachial index indicates moderate left lower  extremity arterial disease.     Ezequiel Essex, MD 05/01/2020, 9:32 AM PGY-1, [place] Intern pager: [phone], text pages welcome

## 2020-05-01 NOTE — Consult Note (Addendum)
Hospital Consult    Reason for Consult:  Bilateral LE pain, edema, skin changes Requesting Physician:  Dr. Jeani Hawking MRN #:  [id number]  History of Present Illness: This is a 79 y.o. male with a known history of peripheral vascular disease who presents with an approximately 2-week history of lower extremity edema, pain and skin changes.  He is interviewed and examined on the cardiovascular nursing unit where he is awake, alert and in no apparent distress.  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  His son-in-law is at bedside and says medical power of attorney.  They state that since the patient has been in the hospital elevating his legs and diuresing the edema is much improved.  The patient currently complains of inability to ambulate secondary to bilateral plantar foot pain and burning.  He also describes anterior ankle burning pain as well as knee pain.  He states that the pain is somewhat improved with dependency, but then states he has to keep his legs elevated because of his "circulation".  His medical history is significant for 5.8 cm abdominal aortic aneurysm is post endovascular repair by Dr. Trula Slade on November 28, 2011.  The patient also has a history of bilateral carotid artery stenosis.  He was last seen in follow-up on February 17, 2017.  At that time, the aneurysmal sac was 4.9 cm in its maximal diameter and he had right common iliac artery stenosis in the 60 to 79% range in the left internal carotid artery stenosis of 40 to 59%.  He was advised to follow-up in 1 year, however the patient stated the cost of this escalated and he did not return.  He denies past or present history of stroke or mini stroke symptoms.  He ambulates with rolling walker and cane.  His medical history is also significant for congestive heart failure and deep venous thrombosis.  He was placed on Eliquis but sustained a significant gastrointestinal bleed and this was discontinued.  He is status post IVC  filter.  The pt is on a statin for cholesterol management.  The pt not on a daily aspirin.   Other AC:  (prior Eliquis, stopped due to GI bleed) The pt is nitrate, diuretic, BB, CCB  for CHF/hypertension.   The pt in not diabetic.   Tobacco hx:  Current; 1 ppd  Past Medical History:  Diagnosis Date  . AAA (abdominal aortic aneurysm) ([place]) 11/2011  . Abdominal aneurysm without mention of rupture 01/06/2012  . Abdominal tightness 01/11/2013  . Allergic rhinitis   . Arthritis   . DVT (deep venous thrombosis) ([place])   . Dysmetabolic syndrome   . Gastritis and gastroduodenitis   . Generalized osteoarthritis   . GERD (gastroesophageal reflux disease)   . GI bleed 04/13/2019  . GIB (gastrointestinal bleeding) 04/13/2019  . Gout   . Headache(784.0)   . HFrEF (heart failure with reduced ejection fraction) ([place]) 08/06/2019  . History of GI bleed 08/06/2019  . Hypercholesterolemia   . Hypertension   . Ischemic cardiomyopathy 08/06/2019  . Lower extremity edema   . Melena 04/13/2019  . Mixed hyperlipidemia 08/06/2019  . Nausea alone 07/13/2013  . 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 Peptic ulcer   . Shortness of breath   . Symptomatic anemia 04/13/2019  . UGI bleed     Past Surgical History:  Procedure Laterality Date  . ABDOMINAL AORTIC ANEURYSM REPAIR  11/28/11   stent   . ABDOMINAL AORTIC ANEURYSM  REPAIR    . APPENDECTOMY    . BIOPSY  04/15/2019   Procedure: BIOPSY;  Surgeon: Thornton Park, MD;  Location: [facility];  Service: Gastroenterology;;  . cataracts    . ESOPHAGOGASTRODUODENOSCOPY (EGD) WITH PROPOFOL N/A 04/15/2019   Procedure: ESOPHAGOGASTRODUODENOSCOPY (EGD) WITH PROPOFOL;  Surgeon: Thornton Park, MD;  Location: [facility];  Service: Gastroenterology;  Laterality: N/A;  . EYE SURGERY  2009   Left cataract  . 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 FOOT SURGERY    . IR IVC FILTER PLMT / S&I /IMG GUID/MOD  SED  04/14/2019  . KNEE ARTHROSCOPY    . TONSILLECTOMY      Allergies  Allergen Reactions  . Lisinopril Cough    Pt reports significant cough  . Losartan Other (See Comments)    Pt reported cough and chest pain    Prior to Admission medications   Medication Sig Start Date End Date Taking? Authorizing Provider  acetaminophen (TYLENOL) 325 MG tablet Take 650 mg by mouth every 6 (six) hours as needed for mild pain.    Yes [provider]  albuterol (VENTOLIN HFA) 108 (90 Base) MCG/ACT inhaler Inhale 2 puffs into the lungs daily as needed for wheezing or shortness of breath.  10/30/19  Yes [provider]  allopurinol (ZYLOPRIM) 300 MG tablet Take 300 mg by mouth daily.  06/24/12  Yes [provider]  atorvastatin (LIPITOR) 20 MG tablet Take 20 mg by mouth daily.    Yes [provider]  beta carotene w/minerals (OCUVITE) tablet Take 1 tablet by mouth daily.   Yes [provider]  famotidine (PEPCID) 40 MG tablet Take 40 mg by mouth every evening.  02/15/20  Yes [provider]  ferrous sulfate 324 MG TBEC Take 324 mg by mouth daily with breakfast.   Yes [provider]  furosemide (LASIX) 40 MG tablet Take 1 tablet (40 mg total) by mouth 2 (two) times daily. 11/12/19  Yes Tobb, Kardie, DO  gabapentin (NEURONTIN) 300 MG capsule Take 300 mg by mouth at bedtime.  07/19/19  Yes [provider]  HYDROcodone-acetaminophen (NORCO/VICODIN) 5-325 MG tablet Take 1 tablet by mouth every 6 (six) hours as needed for moderate pain.  04/20/20  Yes [provider]  isosorbide mononitrate (IMDUR) 60 MG 24 hr tablet TAKE 1 TABLET(60 MG) BY MOUTH DAILY Patient taking differently: Take 60 mg by mouth daily.  02/16/20  Yes Tobb, Kardie, DO  metolazone (ZAROXOLYN) 5 MG tablet Take 1 tablet 30 minutes prior to taking your morning Lasix. Take for 7 days. Patient taking differently: Take 5 mg by mouth See admin instructions. Take 1 tablet (5mg ) 30  minutes prior to taking your morning Lasix. 04/26/20  Yes Tobb, Kardie, DO  metoprolol succinate (TOPROL-XL) 25 MG 24 hr tablet TAKE 1/2 TABLET(12.5 MG) BY MOUTH DAILY Patient taking differently: Take 12.5 mg by mouth daily.  10/15/19  Yes Tobb, Kardie, DO  nitroGLYCERIN (NITROSTAT) 0.4 MG SL tablet Place 1 tablet (0.4 mg total) under the tongue every 5 (five) minutes as needed. Patient taking differently: Place 0.4 mg under the tongue every 5 (five) minutes as needed for chest pain.  08/20/19 04/30/20 Yes Tobb, Kardie, DO  pantoprazole (PROTONIX) 40 MG tablet Take 40 mg by mouth 2 (two) times daily.   Yes [provider]  potassium chloride SA (KLOR-CON) 20 MEQ tablet Take 1 tablet (20 mEq total) by mouth 2 (two) times daily. 11/12/19  Yes Tobb, Godfrey Pick,  DO  sucralfate (CARAFATE) 1 g tablet Take 1 g by mouth 4 (four) times daily -  with meals and at bedtime.   Yes [provider]  SYMBICORT 160-4.5 MCG/ACT inhaler Inhale 2 puffs into the lungs daily as needed (for asthma).  10/21/19  Yes [provider]  amLODipine (NORVASC) 10 MG tablet  11/24/19   [provider]  atorvastatin (LIPITOR) 40 MG tablet  03/10/20   [provider]    Social History   Socioeconomic History  . Marital status: Married    Spouse name: Not on file  . Number of children: Not on file  . Years of education: Not on file  . Highest education level: Not on file  Occupational History  . Not on file  Tobacco Use  . Smoking status: Current Every Day Smoker    Packs/day: 0.25    Years: 51.00    Pack years: 12.75    Types: Cigarettes  . Smokeless tobacco: Never Used  Vaping Use  . Vaping Use: Never used  Substance and Sexual Activity  . Alcohol use: No  . Drug use: No  . Sexual activity: Not on file  Other Topics Concern  . Not on file  Social History Narrative  . Not on file   Social Determinants of Health   Financial Resource Strain:   . Difficulty of Paying Living  Expenses: Not on file  Food Insecurity: No Food Insecurity  . Worried About Charity fundraiser in the Last Year: Never true  . Ran Out of Food in the Last Year: Never true  Transportation Needs: No Transportation Needs  . Lack of Transportation (Medical): No  . Lack of Transportation (Non-Medical): No  Physical Activity:   . Days of Exercise per Week: Not on file  . Minutes of Exercise per Session: Not on file  Stress:   . Feeling of Stress : Not on file  Social Connections:   . Frequency of Communication with Friends and Family: Not on file  . Frequency of Social Gatherings with Friends and Family: Not on file  . Attends Religious Services: Not on file  . Active Member of Clubs or Organizations: Not on file  . Attends Archivist Meetings: Not on file  . Marital Status: Not on file  Intimate Partner Violence:   . Fear of Current or Ex-Partner: Not on file  . Emotionally Abused: Not on file  . Physically Abused: Not on file  . Sexually Abused: Not on file     Family History  Problem Relation Age of Onset  . Cancer Mother   . Hypertension Mother   . Leukemia Mother   . Prostate cancer Father   . Cancer Sister   . Hypertension Sister   . Hypertension Brother   . Hypertension Daughter   . Heart attack Maternal Grandfather     ROS: [x]  Positive   [ ]  Negative   [ ]  All sytems reviewed and are negative  Cardiac: []  chest pain/pressure []  palpitations []  SOB lying flat []  DOE  Vascular: []  pain in legs while walking []  pain in legs at rest []  pain in legs at night [x]  non-healing ulcers [x]  hx of DVT [x]  swelling in legs  Pulmonary: []  productive cough []  asthma/wheezing []  home O2  Neurologic: []  weakness in []  arms []  legs []  numbness in []  arms []  legs []  hx of CVA []  mini stroke [] difficulty speaking or slurred speech []  temporary loss of vision in  one eye []  dizziness  Hematologic: []  hx of cancer []  bleeding problems []  problems with  blood clotting easily  Endocrine:   []  diabetes []  thyroid disease  GI []  vomiting blood []  blood in stool  GU: [x]  CKD/renal failure []  HD--[]  M/W/F or []  T/T/S []  burning with urination []  blood in urine  Psychiatric: []  anxiety [x]  depression  Musculoskeletal: []  arthritis []  joint pain  Integumentary: [x]  rashes [x]  ulcers  Constitutional: []  fever []  chills   Physical Examination  Vitals:   05/01/20 0813 05/01/20 1140  BP: 118/82 (!) 119/91  Pulse: 90 84  Resp:  17  Temp:  98.3 F (36.8 C)  SpO2:  97%   Body mass index is 23.43 kg/m.  General:  WDWN in NAD Gait: Not observed HENT: WNL, normocephalic Pulmonary: normal non-labored breathing, without Rales, rhonchi,  wheezing Cardiac: regular, with  Murmurs,  without carotid bruits Abdomen:  soft, NT/ND, no masses Skin: with petechial rashes Vascular Exam/Pulses:  Right Left  Radial 2+ (normal) 2+ (normal)  Ulnar  not evaluated  not evaluated  Brachial 2+ (normal) 2+ (normal)  Femoral 2+ (normal) 2+ (normal)  Popliteal trace 2+ (normal)  DP  dampened Doppler  dampened Doppler  PT  monophasic Doppler  monophasic Doppler  Peroneal  monophasic Doppler  monophasic Doppler   Extremities: with ischemic changes, without Gangrene  without open wounds; no edema Musculoskeletal: no muscle wasting or atrophy  Neurologic: A&O X 3;  No focal weakness or paresthesias are detected; speech is fluent/normal Psychiatric:  The pt has Normal affect.       CBC    Component Value Date/Time   WBC 9.8 05/01/2020 0435   RBC 5.28 05/01/2020 0435   HGB 15.6 05/01/2020 0435   HGB 15.3 04/26/2020 1055   HCT 47.1 05/01/2020 0435   HCT 44.9 04/26/2020 1055   PLT 122 (L) 05/01/2020 0435   PLT 140 (L) 04/26/2020 1055   MCV 89.2 05/01/2020 0435   MCV 90 04/26/2020 1055   MCH 29.5 05/01/2020 0435   MCHC 33.1 05/01/2020 0435   RDW 19.0 (H) 05/01/2020 0435   RDW 18.8 (H) 04/26/2020 1055   LYMPHSABS 1.3 04/30/2020  1222   MONOABS 0.8 04/30/2020 1222   EOSABS 0.0 04/30/2020 1222   BASOSABS 0.0 04/30/2020 1222    BMET    Component Value Date/Time   NA 137 05/01/2020 0435   NA 135 04/26/2020 1055   K 2.9 (L) 05/01/2020 0435   CL 93 (L) 05/01/2020 0435   CO2 30 05/01/2020 0435   GLUCOSE 94 05/01/2020 0435   BUN 60 (H) 05/01/2020 0435   BUN 57 (H) 04/26/2020 1055   CREATININE 1.77 (H) 05/01/2020 0435   CALCIUM 9.1 05/01/2020 0435   GFRNONAA 39 (L) 05/01/2020 0435   GFRAA 42 (L) 04/26/2020 1055    COAGS: Lab Results  Component Value Date   INR 1.1 04/30/2020   INR 1.3 (H) 04/13/2019   INR 1.06 11/28/2011     Non-Invasive Vascular Imaging:   Lower extremity venous duplex 04/30/2020 Summary:  RIGHT:  - No evidence of common femoral vein obstruction.    LEFT:  - There is no evidence of deep vein thrombosis in the lower extremity.    - No cystic structure found in the popliteal fossa.      ABIs ABI/TBIToday's ABIToday's TBIPrevious ABIPrevious TBI  +-------+-----------+-----------+------------+------------+  Right 0.69                        +-------+-----------+-----------+------------+------------+  Left  0.70                    Bilateral carotid duplex 02/17/2017 Right ICA stenosis 60 to 79% Left ICA stenosis 40 to 59% Vertebral arteries antegrade Normal flow bilateral subclavian arteries  EVAR duplex 02/17/2017 4.9 cm sac size  ASSESSMENT/PLAN: This is a 79 y.o. male peripheral vascular disease.  ABIs are approximately 0.7 bilaterally with ischemic changes of the skin of the base of the left first and second toes and web space of the right first and second toes.  Monophasic Doppler signals.  He likely has a component of rest pain.   History of DVT, status post IVC filter.  His edema noted in the prior photos is resolved today.  No clear evidence of deep venous thrombosis on venous ultrasound.  History of abdominal  aortic aneurysm status post EVAR 2013.  Has not had surveillance ultrasound in 3 years.  Abdominal aortic aneurysm sac size was 4.9 cm September, 2018.  No symptoms referable to aneurysm currently.  History of bilateral internal carotid artery stenosis.  He is aware he is past due for follow-up.  Currently asymptomatic  History of chronic kidney disease with a current serum creatinine of 1.88.   His hemoglobin this morning was 15.6 with a hematocrit of 47.1.  No leukocytosis.   Cardiac: History of heart failure with an estimated ejection fraction of 25%.  Treated for volume overload with Lasix.  Coronary artery disease, medical management currently.  Thrombocytopenia with a platelet count today of 122,000.  His platelets 1 year ago were 480,000.  -I discussed with the patient the likelihood of need for aortogram with lower extremity runoff.  We discussed his chronic kidney disease and risks associated with intravenous IV contrast.  The patient states currently he does not desire any surgery.  Dr. Carlis Abbott, vascular surgeon on call, will evaluate the patient and provide further work-up and treatment recommendations.  DVT prophylaxis-Lovenox   Risa Grill, PA-C [facility] [phone]  I have seen and evaluated the patient. I agree with the PA note as documented above.  79 year old male who is well-known to vascular surgery given previous stent graft repair of a symptomatic abdominal aortic aneurysm in 2013 by Dr. Trula Slade.  Ultimately we were consulted to evaluate bilateral lower extremity wounds in the setting of abnormal ABIs.  Patient endorses about 2 weeks of wounds that developed at the same time of his profound lower extremity swelling.  He has a history of coronary artery disease as well as cardiomyopathy with an EF of 25 to 30% that severely reduced.  Swelling is significantly improved on exam with aggressive diuresis. Discussed that he has a component of arterial disease  and likely inadequate circulation for wound healing.  His ABIs were 0.7 but I suspect this is falsely elevated given a monophasic waveform at the ankle.  Toe pressures were not measured.  I offered him aortogram, lower extremity arteriogram via transfemoral access to evaluate his runoff.  He does have palpable femoral pulses so suspect this is infrainguinal disease.  We discussed using CO2 to limit contrast.  He has bilateral lower extremity tissue loss but worse on the left foot and would be initial focuse  Ultimately he is not interested in any invasive procedures.  I discussed there is a high likelihood that these wounds will continue to progress and he will ultimately will be at risk for limb loss without intervention.  He states he has good understanding  and is more worried about his kidneys given CKD and his cardiac function at this time.  I gave him the contact information for office and discussed that he contact our office in the future if things get worse.  He does not want follow-up and I offered him follow-up with me in 2 to 3 weeks.  He states due to changes in insurance he will not be coming to that appointment.  We talked about smoking cessation, he has no interest in quiting. I will also order EVAR duplex to ensure no changes in aneurysm size and good stent graft position given no follow-up with [place] since 2018.  Last AAA measurement 4.9 cm.  Marty Heck, MD [facility] [place] Office: [phone]

## 2020-05-01 NOTE — Procedures (Signed)
Echo attempted. Therapy with patient. Will attempt again later.

## 2020-05-02 ENCOUNTER — Other Ambulatory Visit (HOSPITAL_COMMUNITY): Payer: Medicare HMO

## 2020-05-02 DIAGNOSIS — I5020 Unspecified systolic (congestive) heart failure: Secondary | ICD-10-CM

## 2020-05-02 DIAGNOSIS — I739 Peripheral vascular disease, unspecified: Secondary | ICD-10-CM | POA: Diagnosis not present

## 2020-05-02 DIAGNOSIS — I5043 Acute on chronic combined systolic (congestive) and diastolic (congestive) heart failure: Secondary | ICD-10-CM | POA: Diagnosis not present

## 2020-05-02 DIAGNOSIS — I2510 Atherosclerotic heart disease of native coronary artery without angina pectoris: Secondary | ICD-10-CM | POA: Diagnosis not present

## 2020-05-02 DIAGNOSIS — I509 Heart failure, unspecified: Secondary | ICD-10-CM | POA: Diagnosis not present

## 2020-05-02 LAB — BASIC METABOLIC PANEL
Anion gap: 12 (ref 5–15)
BUN: 60 mg/dL — ABNORMAL HIGH (ref 8–23)
CO2: 30 mmol/L (ref 22–32)
Calcium: 8.7 mg/dL — ABNORMAL LOW (ref 8.9–10.3)
Chloride: 93 mmol/L — ABNORMAL LOW (ref 98–111)
Creatinine, Ser: 1.88 mg/dL — ABNORMAL HIGH (ref 0.61–1.24)
GFR, Estimated: 36 mL/min — ABNORMAL LOW (ref >60)
Glucose, Bld: 116 mg/dL — ABNORMAL HIGH (ref 70–99)
Potassium: 3.9 mmol/L (ref 3.5–5.1)
Sodium: 135 mmol/L (ref 135–145)

## 2020-05-02 LAB — CBC
HCT: 45.5 % (ref 39.0–52.0)
Hemoglobin: 15 g/dL (ref 13.0–17.0)
MCH: 29.2 pg (ref 26.0–34.0)
MCHC: 33 g/dL (ref 30.0–36.0)
MCV: 88.7 fL (ref 80.0–100.0)
Platelets: 115 10*3/uL — ABNORMAL LOW (ref 150–400)
RBC: 5.13 MIL/uL (ref 4.22–5.81)
RDW: 19.1 % — ABNORMAL HIGH (ref 11.5–15.5)
WBC: 9.6 10*3/uL (ref 4.0–10.5)
nRBC: 0 % (ref 0.0–0.2)

## 2020-05-02 LAB — MAGNESIUM: Magnesium: 2.1 mg/dL (ref 1.7–2.4)

## 2020-05-02 MED ORDER — POTASSIUM CHLORIDE CRYS ER 10 MEQ PO TBCR
10.0000 meq | EXTENDED_RELEASE_TABLET | Freq: Every day | ORAL | Status: DC
  Administered 2020-05-02: 10 meq via ORAL
  Filled 2020-05-02: qty 1

## 2020-05-02 MED ORDER — POTASSIUM CHLORIDE CRYS ER 10 MEQ PO TBCR
10.0000 meq | EXTENDED_RELEASE_TABLET | Freq: Every day | ORAL | 0 refills | Status: DC
Start: 2020-05-03 — End: 2020-05-02

## 2020-05-02 MED ORDER — METOPROLOL SUCCINATE ER 25 MG PO TB24: 12.5000 mg | ORAL_TABLET | Freq: Every day | ORAL | 0 refills | Status: AC

## 2020-05-02 MED ORDER — POTASSIUM CHLORIDE ER 10 MEQ PO TBCR: 10.0000 meq | EXTENDED_RELEASE_TABLET | Freq: Every day | ORAL | 0 refills | Status: DC

## 2020-05-02 NOTE — Progress Notes (Signed)
Patient up walking with PT with no distress.

## 2020-05-02 NOTE — Progress Notes (Signed)
Physical Therapy Treatment Patient Details Name: Richard Townsend MRN: [id number] DOB: July 30, 1940 Today's Date: 05/02/2020    History of Present Illness Pt is a 79 y.o. M with significant PMH of CAD, HTN, cardiomyopathy, HLD, and history of GI bleed who presents for evaluation of CHF.     PT Comments    Pt making steady progress towards his physical therapy goals, reports improved bilateral foot pain. Pt ambulating 200 feet with a walker, min guard for safety and cues for environmental negotiation due to visual impairment. Suspect pt will mobilize well at home in a familiar environment. Don't anticipate need for PT follow up. Will continue to follow acutely.     Follow Up Recommendations  No PT follow up;Supervision for mobility/OOB     Equipment Recommendations  None recommended by PT    Recommendations for Other Services       Precautions / Restrictions Precautions Precautions: Fall;Other (comment) Precaution Comments: blind Restrictions Weight Bearing Restrictions: No    Mobility  Bed Mobility Overal bed mobility: Modified Independent                Transfers Overall transfer level: Needs assistance Equipment used: Rolling walker (2 wheeled) Transfers: Sit to/from Stand Sit to Stand: Min guard         General transfer comment: Increased time to rise from edge of bed  Ambulation/Gait Ambulation/Gait assistance: Min guard Gait Distance (Feet): 200 Feet Assistive device: Rolling walker (2 wheeled) Gait Pattern/deviations: Step-through pattern;Decreased stride length;Decreased dorsiflexion - left;Decreased dorsiflexion - right;Trunk flexed Gait velocity: decreased   General Gait Details: Cues for environmental negotiation, decreased bilateral foot clearance, no overt LOB   Stairs             Wheelchair Mobility    Modified Rankin (Stroke Patients Only)       Balance Overall balance assessment: Needs assistance Sitting-balance support: Feet  supported Sitting balance-Leahy Scale: Good     Standing balance support: No upper extremity supported;During functional activity Standing balance-Leahy Scale: Fair                              Cognition Arousal/Alertness: Awake/alert Behavior During Therapy: WFL for tasks assessed/performed Overall Cognitive Status: Within Functional Limits for tasks assessed                                        Exercises      General Comments        Pertinent Vitals/Pain Pain Assessment: Faces Faces Pain Scale: Hurts a little bit Pain Location: bilateral feet Pain Descriptors / Indicators: Sore Pain Intervention(s): Monitored during session    Home Living                      Prior Function            PT Goals (current goals can now be found in the care plan section) Acute Rehab PT Goals Patient Stated Goal: less pain and swelling Potential to Achieve Goals: Good Progress towards PT goals: Progressing toward goals    Frequency    Min 3X/week      PT Plan Current plan remains appropriate    Co-evaluation              AM-PAC PT "6 Clicks" Mobility   Outcome Measure  Help needed turning from  your back to your side while in a flat bed without using bedrails?: None Help needed moving from lying on your back to sitting on the side of a flat bed without using bedrails?: None Help needed moving to and from a bed to a chair (including a wheelchair)?: A Little Help needed standing up from a chair using your arms (e.g., wheelchair or bedside chair)?: A Little Help needed to walk in hospital room?: A Little Help needed climbing 3-5 steps with a railing? : A Little 6 Click Score: 20    End of Session Equipment Utilized During Treatment: Gait belt Activity Tolerance: Patient tolerated treatment well Patient left: in chair;with call bell/phone within reach;with chair alarm set;with family/visitor present Nurse Communication: Mobility  status PT Visit Diagnosis: Unsteadiness on feet (R26.81);Muscle weakness (generalized) (M62.81);Pain Pain - Right/Left:  (both) Pain - part of body: Ankle and joints of foot     Time: 1101-1120 PT Time Calculation (min) (ACUTE ONLY): 19 min  Charges:  $Therapeutic Activity: 8-22 mins                     Wyona Almas, PT, DPT Acute Rehabilitation Services Pager [phone] Office [phone]    Deno Etienne 05/02/2020, 12:36 PM

## 2020-05-02 NOTE — Plan of Care (Signed)
  Problem: Education: Goal: Ability to demonstrate management of disease process will improve Outcome: Progressing   Problem: Skin Integrity: Goal: Risk for impaired skin integrity will decrease Outcome: Progressing   Problem: Safety: Goal: Ability to remain free from injury will improve Outcome: Progressing   Problem: Education: Goal: Ability to demonstrate management of disease process will improve Outcome: Progressing   Problem: Activity: Goal: Capacity to carry out activities will improve Outcome: Progressing   Problem: Cardiac: Goal: Ability to achieve and maintain adequate cardiopulmonary perfusion will improve Outcome: Progressing

## 2020-05-02 NOTE — Progress Notes (Signed)
D/C instructions given and reviewed. Questions asked and answered but encouraged to call with any concerns. Tele and IV removed, tolerated well.

## 2020-05-02 NOTE — Plan of Care (Signed)
?  Problem: Education: ?Goal: Ability to demonstrate management of disease process will improve ?Outcome: Adequate for Discharge ?Goal: Ability to verbalize understanding of medication therapies will improve ?Outcome: Adequate for Discharge ?Goal: Individualized Educational Video(s) ?Outcome: Adequate for Discharge ?  ?Problem: Activity: ?Goal: Capacity to carry out activities will improve ?Outcome: Adequate for Discharge ?  ?Problem: Cardiac: ?Goal: Ability to achieve and maintain adequate cardiopulmonary perfusion will improve ?Outcome: Adequate for Discharge ?  ?Problem: Education: ?Goal: Knowledge of General Education information will improve ?Description: Including pain rating scale, medication(s)/side effects and non-pharmacologic comfort measures ?Outcome: Adequate for Discharge ?  ?Problem: Health Behavior/Discharge Planning: ?Goal: Ability to manage health-related needs will improve ?Outcome: Adequate for Discharge ?  ?Problem: Clinical Measurements: ?Goal: Ability to maintain clinical measurements within normal limits will improve ?Outcome: Adequate for Discharge ?Goal: Will remain free from infection ?Outcome: Adequate for Discharge ?Goal: Diagnostic test results will improve ?Outcome: Adequate for Discharge ?Goal: Respiratory complications will improve ?Outcome: Adequate for Discharge ?Goal: Cardiovascular complication will be avoided ?Outcome: Adequate for Discharge ?  ?Problem: Activity: ?Goal: Risk for activity intolerance will decrease ?Outcome: Adequate for Discharge ?  ?Problem: Nutrition: ?Goal: Adequate nutrition will be maintained ?Outcome: Adequate for Discharge ?  ?Problem: Coping: ?Goal: Level of anxiety will decrease ?Outcome: Adequate for Discharge ?  ?Problem: Elimination: ?Goal: Will not experience complications related to bowel motility ?Outcome: Adequate for Discharge ?Goal: Will not experience complications related to urinary retention ?Outcome: Adequate for Discharge ?  ?Problem: Pain  Managment: ?Goal: General experience of comfort will improve ?Outcome: Adequate for Discharge ?  ?Problem: Safety: ?Goal: Ability to remain free from injury will improve ?Outcome: Adequate for Discharge ?  ?Problem: Skin Integrity: ?Goal: Risk for impaired skin integrity will decrease ?Outcome: Adequate for Discharge ?  ?Problem: Education: ?Goal: Ability to demonstrate management of disease process will improve ?Outcome: Adequate for Discharge ?Goal: Ability to verbalize understanding of medication therapies will improve ?Outcome: Adequate for Discharge ?Goal: Individualized Educational Video(s) ?Outcome: Adequate for Discharge ?  ?Problem: Activity: ?Goal: Capacity to carry out activities will improve ?Outcome: Adequate for Discharge ?  ?Problem: Cardiac: ?Goal: Ability to achieve and maintain adequate cardiopulmonary perfusion will improve ?Outcome: Adequate for Discharge ?  ?

## 2020-05-02 NOTE — Discharge Instructions (Signed)
Dear Richard Townsend,  Thank you for letting us participate in your care. You were hospitalized for acute heart failure exacerbation, bilateral foot swelling, and foot wounds. You were treated with lasix (to help reduce fluid) and wound care (to help with healing of your foot wounds).   POST-HOSPITAL & CARE INSTRUCTIONS 1. Follow up with your cardiologist. They will instruct you on how to keep taking the lasix, if at all.  2. Consider establishing a healthcare power of attorney. This is someone who would make medical decisions on your behalf if you were unable to communicate with health care providers.  3. Consider writing an Advance Directive. This is a form that you fill out while healthy and capable to instruct the type of care you would like to receive if you are in need of medical treatment but are unable to communicate with health care providers.  4. We will be coordinating home health services, including wound care, physical therapy, and occupational therapy.  5. Go to your follow up appointments (listed below)   DOCTOR'S APPOINTMENT   Future Appointments  Date Time Provider [place]  05/03/2020  4:00 PM Tobb, Kardie, DO [facility] None  05/23/2020 11:20 AM Tobb, Godfrey Pick, DO [facility] None  06/07/2020  9:30 AM Florance, Tomasa Blase, RN [facility] None    Follow-up Information    Ernestene Kiel, MD. Go on 05/12/2020.   Specialty: Internal Medicine Why: @9 :00am Contact information: [address] [place] [address] [phone]        [name], DO Follow up.   Specialty: Cardiology Why: Keep follow-up appointment as scheduled tomorrow Wednesday May 03, 2020 at 4:00 PM. Contact information: [place] [address] [phone]               Take care and be well!  [facility]  [place], [place] [address] [phone]      Heart Failure,  Self Care Heart failure is a serious condition. This sheet explains things you need to do to take care of yourself at home. To help you stay as healthy as possible, you may be asked to change your diet, take certain medicines, and make other changes in your life. Your doctor may also give you more specific instructions. If you have problems or questions, call your doctor. What are the risks? Having heart failure makes it more likely for you to have some problems. These problems can get worse if you do not take good care of yourself. Problems may include:  Blood clotting problems. This may cause a stroke.  Damage to the kidneys, liver, or lungs.  Abnormal heart rhythms. Supplies needed:  Scale for weighing yourself.  Blood pressure monitor.  Notebook.  Medicines. How to care for yourself when you have heart failure Medicines Take over-the-counter and prescription medicines only as told by your doctor. Take your medicines every day.  Do not stop taking your medicine unless your doctor tells you to do so.  Do not skip any medicines.  Get your prescriptions refilled before you run out of medicine. This is important. Eating and drinking   Eat heart-healthy foods. Talk with a diet specialist (dietitian) to create an eating plan.  Choose foods that: ? Have no trans fat. ? Are low in saturated fat and cholesterol.  Choose healthy foods, such as: ? Fresh or frozen fruits and vegetables. ? Fish. ? Low-fat (lean) meats. ? Legumes,  such as beans, peas, and lentils. ? Fat-free or low-fat dairy products. ? Whole-grain foods. ? High-fiber foods.  Limit salt (sodium) if told by your doctor. Ask your diet specialist to tell you which seasonings are healthy for your heart.  Cook in healthy ways instead of frying. Healthy ways of cooking include roasting, grilling, broiling, baking, poaching, steaming, and stir-frying.  Limit how much fluid you drink, if told by your doctor. Alcohol  use  Do not drink alcohol if: ? Your doctor tells you not to drink. ? Your heart was damaged by alcohol, or you have very bad heart failure. ? You are pregnant, may be pregnant, or are planning to become pregnant.  If you drink alcohol: ? Limit how much you use to:  0-1 drink a day for women.  0-2 drinks a day for men. ? Be aware of how much alcohol is in your drink. In the [place] one drink equals one 12 oz bottle of beer (355 mL), one 5 oz glass of wine (148 mL), or one 1 oz glass of hard liquor (44 mL). Lifestyle   Do not use any products that contain nicotine or tobacco, such as cigarettes, e-cigarettes, and chewing tobacco. If you need help quitting, ask your doctor. ? Do not use nicotine gum or patches before talking to your doctor.  Do not use illegal drugs.  Lose weight if told by your doctor.  Do physical activity if told by your doctor. Talk to your doctor before you begin an exercise if: ? You are an older adult. ? You have very bad heart failure.  Learn to manage stress. If you need help, ask your doctor.  Get rehab (rehabilitation) to help you stay independent and to help with your quality of life.  Plan time to rest when you get tired. Check weight and blood pressure   Weigh yourself every day. This will help you to know if fluid is building up in your body. ? Weigh yourself every morning after you pee (urinate) and before you eat breakfast. ? Wear the same amount of clothing each time. ? Write down your daily weight. Give your record to your doctor.  Check and write down your blood pressure as told by your doctor.  Check your pulse as told by your doctor. Dealing with very hot and very cold weather  If it is very hot: ? Avoid activities that take a lot of energy. ? Use air conditioning or fans, or find a cooler place. ? Avoid caffeine and alcohol. ? Wear clothing that is loose-fitting, lightweight, and light-colored.  If it is very cold: ? Avoid  activities that take a lot of energy. ? Layer your clothes. ? Wear mittens or gloves, a hat, and a scarf when you go outside. ? Avoid alcohol. Follow these instructions at home:  Stay up to date with shots (vaccines). Get pneumococcal and flu (influenza) shots.  Keep all follow-up visits as told by your doctor. This is important. Contact a doctor if:  You gain weight quickly.  You have increasing shortness of breath.  You cannot do your normal activities.  You get tired easily.  You cough a lot.  You don't feel like eating or feel like you may vomit (nauseous).  You become puffy (swell) in your hands, feet, ankles, or belly (abdomen).  You cannot sleep well because it is hard to breathe.  You feel like your heart is beating fast (palpitations).  You get dizzy when you stand up. Get  help right away if:  You have trouble breathing.  You or someone else notices a change in your behavior, such as having trouble staying awake.  You have chest pain or discomfort.  You pass out (faint). These symptoms may be an emergency. Do not wait to see if the symptoms will go away. Get medical help right away. Call your local emergency services (911 in the [place] Do not drive yourself to the hospital. Summary  Heart failure is a serious condition. To care for yourself, you may have to change your diet, take medicines, and make other lifestyle changes.  Take your medicines every day. Do not stop taking them unless your doctor tells you to do so.  Eat heart-healthy foods, such as fresh or frozen fruits and vegetables, fish, lean meats, legumes, fat-free or low-fat dairy products, and whole-grain or high-fiber foods.  Ask your doctor if you can drink alcohol. You may have to stop alcohol use if you have very bad heart failure.  Contact your doctor if you gain weight quickly or feel that your heart is beating too fast. Get help right away if you pass out, or have chest pain or trouble  breathing. This information is not intended to replace advice given to you by your health care provider. Make sure you discuss any questions you have with your health care provider. Document Revised: 09/08/2018 Document Reviewed: 09/09/2018 Elsevier Patient Education  [place].

## 2020-05-02 NOTE — Progress Notes (Addendum)
Progress Note  Patient Name: Richard Townsend Date of Encounter: 05/02/2020  Primary Cardiologist: [name], DO  Subjective   Feeling much better, hopeful to go home. No complaints today. Edema markedly improved. He reports that the metolazone on his medicine list was only started right before this hospital stay - was not on this for maintenance. Son in [place] at bedside as well.  Inpatient Medications    Scheduled Meds:  allopurinol  300 mg Oral Daily   atorvastatin  20 mg Oral Daily   enoxaparin (LOVENOX) injection  40 mg Subcutaneous Q24H   famotidine  40 mg Oral QPM   furosemide  40 mg Oral BID   gabapentin  300 mg Oral QHS   isosorbide mononitrate  60 mg Oral Daily   metoprolol succinate  12.5 mg Oral Daily   mometasone-formoterol  2 puff Inhalation BID   multivitamin  1 tablet Oral Daily   nicotine  7 mg Transdermal Daily   pantoprazole  40 mg Oral BID   sodium chloride flush  3 mL Intravenous Q12H   Continuous Infusions:  sodium chloride     PRN Meds: sodium chloride, acetaminophen, albuterol, ondansetron (ZOFRAN) IV, oxyCODONE, sodium chloride flush   Vital Signs    Vitals:   05/02/20 0414 05/02/20 0454 05/02/20 0748 05/02/20 0840  BP: 125/90  127/78   Pulse: 87  94   Resp: 17  17   Temp: 98.8 F (37.1 C)  98.4 F (36.9 C)   TempSrc: Oral     SpO2: 95%  96% 96%  Weight:  69.5 kg      Intake/Output Summary (Last 24 hours) at 05/02/2020 1112 Last data filed at 05/02/2020 1102 Gross per 24 hour  Intake 1170 ml  Output 2100 ml  Net -930 ml   Last 3 Weights 05/02/2020 05/02/2020 05/01/2020  Weight (lbs) 153 lb 3.2 oz 153 lb 3.2 oz 154 lb 1.6 oz  Weight (kg) 69.491 kg 69.491 kg 69.9 kg     Telemetry    NSR occasional PVCs, 5 beats NSVT - Personally Reviewed  Physical Exam   GEN: No acute distress, chronically deconditioned appearing HEENT: Normocephalic, atraumatic, sclera non-icteric. Neck: No JVD or bruits. Cardiac: RRR no murmurs,  rubs, or gallops.  Respiratory: Clear to auscultation bilaterally. Breathing is unlabored. GI: Soft, nontender, non-distended, BS +x 4. MS: no deformity. Extremities: No clubbing or cyanosis. No edema.  Neuro:  AAOx3. Follows commands. Psych:  Responds to questions appropriately with a normal affect.  Labs    High Sensitivity Troponin:  No results for input(s): TROPONINIHS in the last 720 hours.    Cardiac EnzymesNo results for input(s): TROPONINI in the last 168 hours. No results for input(s): TROPIPOC in the last 168 hours.   Chemistry Recent Labs  Lab 04/26/20 1055 04/30/20 1222 04/30/20 2146 04/30/20 2146 05/01/20 0435 05/01/20 1434 05/02/20 0314  NA 135   < > 136   < > 137 134* 135  K 5.8*   < > 2.9*   < > 2.9* 3.3* 3.9  CL 99   < > 92*   < > 93* 92* 93*  CO2 19*   < > 28   < > 30 27 30   GLUCOSE 113*   < > 138*   < > 94 175* 116*  BUN 57*   < > 63*   < > 60* 62* 60*  CREATININE 1.77*   < > 1.65*   < > 1.77* 1.88* 1.88*  CALCIUM 10.0   < > 9.3   < > 9.1 8.7* 8.7*  PROT  --   --  6.3*  --  6.1*  --   --   ALBUMIN  --   --  3.0*  --  3.1*  --   --   AST  --   --  26  --  26  --   --   ALT  --   --  105*  --  103*  --   --   ALKPHOS  --   --  121  --  120  --   --   BILITOT  --   --  2.0*  --  2.4*  --   --   GFRNONAA 36*   < > 43*   < > 39* 36* 36*  GFRAA 42*  --   --   --   --   --   --   ANIONGAP  --    < > 16*   < > 14 15 12    < > = values in this interval not displayed.     Hematology Recent Labs  Lab 04/30/20 1222 05/01/20 0435 05/02/20 0314  WBC 11.0* 9.8 9.6  RBC 5.60 5.28 5.13  HGB 16.9 15.6 15.0  HCT 51.1 47.1 45.5  MCV 91.3 89.2 88.7  MCH 30.2 29.5 29.2  MCHC 33.1 33.1 33.0  RDW 19.9* 19.0* 19.1*  PLT 133* 122* 115*    BNP Recent Labs  Lab 04/30/20 1222  BNP 3,258.8*     DDimer No results for input(s): DDIMER in the last 168 hours.   Radiology    DG Chest 2 View  Result Date: 04/30/2020 CLINICAL DATA:  Shortness of breath and  lower extremity edema 1 week. EXAM: CHEST - 2 VIEW COMPARISON:  09/08/2019 FINDINGS: Lungs are adequately inflated with hazy prominence of the perihilar vessels suggesting minimal vascular congestion. No significant effusion. Mild stable cardiomegaly. Remainder the exam is unchanged. IMPRESSION: Mild cardiomegaly with suggestion of minimal vascular congestion. Electronically Signed   By: Marin Olp M.D.   On: 04/30/2020 13:36   VAS [place] ABI WITH/WO TBI  Result Date: 05/01/2020 LOWER EXTREMITY DOPPLER STUDY Indications: Ulceration. High Risk Factors: Hypertension, current smoker, coronary artery disease.  Limitations: Today's exam was limited due to an open wound, bandages and              involuntary patient movement. Comparison Study: No prior study on file Performing Technologist: Sharion Dove RVS  Examination Guidelines: A complete evaluation includes at minimum, Doppler waveform signals and systolic blood pressure reading at the level of bilateral brachial, anterior tibial, and posterior tibial arteries, when vessel segments are accessible. Bilateral testing is considered an integral part of a complete examination. Photoelectric Plethysmograph (PPG) waveforms and toe systolic pressure readings are included as required and additional duplex testing as needed. Limited examinations for reoccurring indications may be performed as noted.  ABI Findings: +---------+------------------+-----+-------------------+---------------+ Right    Rt Pressure (mmHg)IndexWaveform           Comment         +---------+------------------+-----+-------------------+---------------+ Brachial 135                    triphasic                          +---------+------------------+-----+-------------------+---------------+ PTA      93  0.69 monophasic                         +---------+------------------+-----+-------------------+---------------+ DP       59                0.44 dampened  monophasic                +---------+------------------+-----+-------------------+---------------+ Great Toe                                          ulcers/bandages +---------+------------------+-----+-------------------+---------------+ +---------+------------------+-----+-------------------+---------------+ Left     Lt Pressure (mmHg)IndexWaveform           Comment         +---------+------------------+-----+-------------------+---------------+ Brachial 128                    triphasic                          +---------+------------------+-----+-------------------+---------------+ PTA      94                0.70 monophasic                         +---------+------------------+-----+-------------------+---------------+ DP       81                0.60 dampened monophasic                +---------+------------------+-----+-------------------+---------------+ Great Toe                                          ulcers/bandages +---------+------------------+-----+-------------------+---------------+ +-------+-----------+-----------+------------+------------+ ABI/TBIToday's ABIToday's TBIPrevious ABIPrevious TBI +-------+-----------+-----------+------------+------------+ Right  0.69                                           +-------+-----------+-----------+------------+------------+ Left   0.70                                           +-------+-----------+-----------+------------+------------+ No prior study on file.  Summary: Right: Resting right ankle-brachial index indicates moderate right lower extremity arterial disease. Left: Resting left ankle-brachial index indicates moderate left lower extremity arterial disease.  *See table(s) above for measurements and observations.  Electronically signed by Monica Martinez MD on 05/01/2020 at 4:41:31 PM.    Final    ECHOCARDIOGRAM COMPLETE  Result Date: 05/01/2020    ECHOCARDIOGRAM REPORT   Patient Name:    Richard Townsend Date of Exam: 05/01/2020 Medical Rec #:  [id number]       Height:       68.0 in Accession #:    [id number]      Weight:       154.1 lb Date of Birth:  [birth year]       BSA:          1.829 m Patient Age:    61 years        BP:           118/82  mmHg Patient Gender: M               HR:           69 bpm. Exam Location:  Inpatient Procedure: 2D Echo, Color Doppler and Cardiac Doppler Indications:    Dyspnea  History:        Patient has prior history of Echocardiogram examinations, most                 recent 04/15/2019. CAD; Risk Factors:Hypertension. H/O DVT.                 Cardiomyopathy.  Sonographer:    Clayton Lefort RDCS (AE) Referring Phys: [place]  1. Left ventricular ejection fraction, by estimation, is 30 to 35%. The left ventricle has moderately decreased function. The left ventricle demonstrates global hypokinesis. There is moderate left ventricular hypertrophy. Left ventricular diastolic function could not be evaluated.  2. Right ventricular systolic function is normal. The right ventricular size is normal. There is normal pulmonary artery systolic pressure. The estimated right ventricular systolic pressure is 03.4 mmHg.  3. Left atrial size was moderately dilated.  4. Right atrial size was moderately dilated.  5. The mitral valve is abnormal. Mild mitral valve regurgitation. Moderate mitral annular calcification. Reduced excursion of the anterior mitral leaflet due to posteriorly directed AI, causing at probably mild mitral stenosis.  6. Tricuspid valve regurgitation is moderate.  7. The aortic valve is calcified. Aortic valve regurgitation is mild to moderate. The AI jet is posteriorly directed and impair anterior excursion of the mitral leaflet. Moderate to severe aortic valve stenosis. Aortic valve area, by VTI measures 0.72 cm. Aortic valve mean gradient measures 16 mmHg. Aortic valve Vmax measures 2.61 m/s. DI is 0.21.  8. The inferior vena cava is normal in  size with greater than 50% respiratory variability, suggesting right atrial pressure of 3 mmHg. Comparison(s): Changes from prior study are noted. 04/15/2019: LVEF 30-35%, aortic stenosis could not be definitively evaluated. Conclusion(s)/Recommendation(s): Consider low flow, low gradient severe AS. Consider additional testing for contracile reserve with dobutamine stress echocardiography. FINDINGS  Left Ventricle: Left ventricular ejection fraction, by estimation, is 30 to 35%. The left ventricle has moderately decreased function. The left ventricle demonstrates global hypokinesis. The left ventricular internal cavity size was normal in size. There is moderate left ventricular hypertrophy. Left ventricular diastolic function could not be evaluated due to indeterminate diastolic function. Left ventricular diastolic function could not be evaluated. Right Ventricle: The right ventricular size is normal. No increase in right ventricular wall thickness. Right ventricular systolic function is normal. There is normal pulmonary artery systolic pressure. The tricuspid regurgitant velocity is 2.53 m/s, and  with an assumed right atrial pressure of 3 mmHg, the estimated right ventricular systolic pressure is 74.2 mmHg. Left Atrium: Left atrial size was moderately dilated. Right Atrium: Right atrial size was moderately dilated. Pericardium: There is no evidence of pericardial effusion. Mitral Valve: The mitral valve is abnormal. There is moderate thickening of the mitral valve leaflet(s). Moderate mitral annular calcification. Moderate mitral valve regurgitation. Tricuspid Valve: The tricuspid valve is not well visualized. Tricuspid valve regurgitation is moderate. Aortic Valve: The aortic valve is calcified. Aortic valve regurgitation is mild to moderate. Aortic regurgitation PHT measures 357 msec. Moderate to severe aortic stenosis is present. Aortic valve mean gradient measures 16.0 mmHg. Aortic valve peak gradient measures  27.2 mmHg. Aortic valve area, by VTI measures 0.72 cm. Pulmonic Valve: The pulmonic valve was  grossly normal. Pulmonic valve regurgitation is trivial. Aorta: The aortic root and ascending aorta are structurally normal, with no evidence of dilitation. Venous: The inferior vena cava is normal in size with greater than 50% respiratory variability, suggesting right atrial pressure of 3 mmHg. IAS/Shunts: No atrial level shunt detected by color flow Doppler.  LEFT VENTRICLE PLAX 2D LVIDd:         5.50 cm LVIDs:         4.60 cm LV PW:         1.60 cm LV IVS:        1.70 cm LVOT diam:     2.10 cm LV SV:         33 LV SV Index:   18 LVOT Area:     3.46 cm  LV Volumes (MOD) LV vol d, MOD A2C: 197.0 ml LV vol d, MOD A4C: 192.0 ml LV vol s, MOD A2C: 154.0 ml LV vol s, MOD A4C: 111.0 ml LV SV MOD A2C:     43.0 ml LV SV MOD A4C:     192.0 ml LV SV MOD BP:      65.8 ml RIGHT VENTRICLE             IVC RV Basal diam:  4.00 cm     IVC diam: 1.60 cm RV Mid diam:    3.70 cm RV S prime:     11.10 cm/s TAPSE (M-mode): 2.8 cm LEFT ATRIUM             Index       RIGHT ATRIUM           Index LA diam:        4.40 cm 2.41 cm/m  RA Area:     22.40 cm LA Vol (A2C):   72.0 ml 39.36 ml/m RA Volume:   75.40 ml  41.22 ml/m LA Vol (A4C):   80.6 ml 44.06 ml/m LA Biplane Vol: 75.8 ml 41.43 ml/m  AORTIC VALVE AV Area (Vmax):    0.68 cm AV Area (Vmean):   0.72 cm AV Area (VTI):     0.72 cm AV Vmax:           261.00 cm/s AV Vmean:          179.500 cm/s AV VTI:            0.456 m AV Peak Grad:      27.2 mmHg AV Mean Grad:      16.0 mmHg LVOT Vmax:         51.10 cm/s LVOT Vmean:        37.300 cm/s LVOT VTI:          0.094 m LVOT/AV VTI ratio: 0.21 AI PHT:            357 msec  AORTA Ao Root diam: 3.50 cm Ao Asc diam:  3.70 cm MR Peak grad: 88.7 mmHg   TRICUSPID VALVE MR Mean grad: 53.0 mmHg   TR Peak grad:   25.6 mmHg MR Vmax:      471.00 cm/s TR Vmax:        253.00 cm/s MR Vmean:     343.0 cm/s                           SHUNTS  Systemic VTI:  0.09 m                           Systemic Diam: 2.10 cm Lyman Bishop MD Electronically signed by Lyman Bishop MD Signature Date/Time: 05/01/2020/11:16:10 AM    Final    VAS [place] LOWER EXTREMITY VENOUS (DVT) (ONLY [facility] & [facility])  Result Date: 04/30/2020  Lower Venous DVT Study Indications: Edema.  Comparison Study: no prior Performing Technologist: Abram Sander RVS  Examination Guidelines: A complete evaluation includes B-mode imaging, spectral Doppler, color Doppler, and power Doppler as needed of all accessible portions of each vessel. Bilateral testing is considered an integral part of a complete examination. Limited examinations for reoccurring indications may be performed as noted. The reflux portion of the exam is performed with the patient in reverse Trendelenburg.  +-----+---------------+---------+-----------+----------+--------------+ RIGHTCompressibilityPhasicitySpontaneityPropertiesThrombus Aging +-----+---------------+---------+-----------+----------+--------------+ CFV  Full           Yes      Yes                                 +-----+---------------+---------+-----------+----------+--------------+   +---------+---------------+---------+-----------+----------+--------------+ LEFT     CompressibilityPhasicitySpontaneityPropertiesThrombus Aging +---------+---------------+---------+-----------+----------+--------------+ CFV      Full           Yes      Yes                                 +---------+---------------+---------+-----------+----------+--------------+ SFJ      Full                                                        +---------+---------------+---------+-----------+----------+--------------+ FV Prox  Full                                                        +---------+---------------+---------+-----------+----------+--------------+ FV Mid   Full                                                         +---------+---------------+---------+-----------+----------+--------------+ FV DistalFull                                                        +---------+---------------+---------+-----------+----------+--------------+ PFV      Full                                                        +---------+---------------+---------+-----------+----------+--------------+ POP      Full           Yes  Yes                                 +---------+---------------+---------+-----------+----------+--------------+ PTV      Full                                                        +---------+---------------+---------+-----------+----------+--------------+ PERO     Full                                                        +---------+---------------+---------+-----------+----------+--------------+     Summary: RIGHT: - No evidence of common femoral vein obstruction.  LEFT: - There is no evidence of deep vein thrombosis in the lower extremity.  - No cystic structure found in the popliteal fossa.  *See table(s) above for measurements and observations. Electronically signed by Deitra Mayo MD on 04/30/2020 at 6:20:59 PM.    Final     Cardiac Studies   2D Echo 05/01/20   1. Left ventricular ejection fraction, by estimation, is 30 to 35%. The  left ventricle has moderately decreased function. The left ventricle  demonstrates global hypokinesis. There is moderate left ventricular  hypertrophy. Left ventricular diastolic  function could not be evaluated.   2. Right ventricular systolic function is normal. The right ventricular  size is normal. There is normal pulmonary artery systolic pressure. The  estimated right ventricular systolic pressure is 65.7 mmHg.   3. Left atrial size was moderately dilated.   4. Right atrial size was moderately dilated.   5. The mitral valve is abnormal. Mild mitral valve regurgitation.  Moderate mitral annular calcification. Reduced  excursion of the anterior  mitral leaflet due to posteriorly directed AI, causing at probably mild  mitral stenosis.   6. Tricuspid valve regurgitation is moderate.   7. The aortic valve is calcified. Aortic valve regurgitation is mild to  moderate. The AI jet is posteriorly directed and impair anterior excursion  of the mitral leaflet. Moderate to severe aortic valve stenosis. Aortic  valve area, by VTI measures 0.72  cm. Aortic valve mean gradient measures 16 mmHg. Aortic valve Vmax  measures 2.61 m/s. DI is 0.21.   8. The inferior vena cava is normal in size with greater than 50%  respiratory variability, suggesting right atrial pressure of 3 mmHg.   Comparison(s): Changes from prior study are noted. 04/15/2019: LVEF 30-35%,  aortic stenosis could not be definitively evaluated.   Conclusion(s)/Recommendation(s): Consider low flow, low gradient severe  AS. Consider additional testing for contracile reserve with dobutamine  stress echocardiography.   Patient Profile     79 y.o. male with history of cardiac arrest in 02/2019 in context of GI bleeding and peptic ulcer disease, HFrEF (initial EF normal 02/2019 then 25-30% in 03/2019 prompting LHC showing CAD with showing mild distal 30% left main, proximal LAD 50% with codominant RCA with severe diffuse CAD with ostial 100% CTO), DVT (previously Eliquis discontinued due to GIB, reported h/o IVC filter), hypertension, HLD, CKD stage III, AAA repair, COPD, gout. OP f/u echo 04/2019 had shown EF 30-35% with recent plans to pursue updated testing  due to worsening edema/SOB. He presented to the hospital with worsening symptoms including weeping edema despite increase in diuretics. Also being seen by wound care and vascular team for abnormal ABIs and noted to have thrombocytopenia as well.  Assessment & Plan    1. Acute on chronic HFrEF, managed in context of AKI on CKD stage III - s/p IV diuresis - > transitioned to oral Lasix yesterday and doing  great - weight not markedly changed but clinically improved with -2.5L out - previously intolerant of Entresto due to symptomatic hypotension - hold off spiro given AKI  - continue BB, nitrate - Got 19meq of KCl total yesterday, K now 3.9. Will start 2meq daily while on Lasix. Was on 67meq BID previously but this led to hyperkalemia so would be conservative  2. Valvular heart disease  - echo report suggests moderate to severe aortic stenosis, mild to moderate aortic regurgitation, mild mitral regurgitation, mild mitral stenosis, moderate tricuspid regurgitation - felt to be at least mild by echo per Dr. Oralia Rud note, cannot exclude low flow low gradient AS - do not see any plans for further eval of this  3. CAD - continue BB, Imdur, statin (consider statin titration) - not currently on ASA or any blood thinners in context of previous significant GI bleeding and peptic ulcer disease  4. PAD with prior AAA repair - VVS following  5. PVCs/NSVT (5 beats) - will discuss med titration with MD - patient has elected DNR therefore ICD not appropriate to pursue - recommend keeping K 4.0 or greater and Mg 2.0 or greater   I reached out to Dr. Harriet Masson who recommended keeping his pre-existing f/u appointment for tomorrow so we will leave this. Please let us know if the patient doesn't end up discharged today at which time we'll need to reschedule.  For questions or updates, please contact [facility] Please consult [email or web address] for contact info under Cardiology/STEMI.  Signed, Charlie Pitter, PA-C 05/02/2020, 11:12 AM  Personally seen and examined. Agree with APP above with the following comments: Briefly 79 yo M with CAD, PAD, and HFrEF; on optimally tolerated GDMT Clarification from 05/01/20- has not been able to tolerate AC in the setting of significant bleed Discussed with patient in regard to LFLGAS, AI, MR, TR and EF ~ 25-30%.  On reasonable medication mgmt in the setting of his  CKD and past drug intolerances Gave education on Discharge diet to patient and son-in-law  Werner Lean, MD

## 2020-05-02 NOTE — Discharge Summary (Addendum)
[facility] Discharge Summary  Patient name: Richard Townsend Medical record number: [id number] Date of birth: [birth year] Age: 79 y.o. Gender: male Date of Admission: 04/30/2020  Date of Discharge: 05/02/2020 Admitting Physician: Ezequiel Essex, MD  Primary Care Provider: Ernestene Kiel, MD Consultants: Cardiology, Vascular Surgery, Wound Care  Indication for Hospitalization: Acute heart failure exacerbation  Discharge Diagnoses/Problem List:  Subacute bilateral lower extremity edema HFrEF CKD Stage 2 Thrombocytopenia CAD  hx MI  hx AAA s/p repair DVT s/p IVC Hx GI bleed GERD  Hx stomach ulcer COPD Hyperlipidemia Gout Tobacco use  Disposition: Home  Discharge Condition: Stable  Discharge Exam:   General: Awake, alert, oriented Cardiovascular: Regular rate and rhythm, S1 and S2 present Respiratory: Lung fields clear to auscultation bilaterally Extremities: edema of bilateral feet to level of ankles, reddened toes with thickened toenails, both feet wrap with dry wrap from toes to mid-calf Neuro: Cranial nerves II through X grossly intact, able to move all extremities spontaneously   Brief Hospital Course:  Richard Townsend is a 79 y.o. male presenting with bilateral calf and foot swelling. 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   Subacute bilateral lower extremity edema 2/2 acute HFrEF exacerbation Patient presents with 2-week history of developing BLE edema, becoming more more painful.  Upon admission, patient had BNP of 3258.8 in addition to shiny bright red feet with open wounds, scattered black eschar, and 3+ pitting edema that obscured palpation of dorsal pedal and pretibial pulses (however pulses were dopplerable).  Some dyspnea but lungs clear to auscultation bilaterally.  Concern for acute worsening of HFrEF.  Cardiology consulted, recommended gentle  diuresis with 40 mg IV Lasix twice daily.  Echo demonstrated worsening valvular disease but stable EF of 30 to 35%, congruent with echo last year.  Diuresis performed with Lasix only, metolazone was held.  Patient experienced marked improvement of BLE edema and dyspnea with diuresis, net output of -2,705 mL over admission.  Wound care was consulted and recommended daily care of the interdigital wounds using a silver hydrofiber cut into strips and placed between toes after cleansing and drying, a dry boot wrap from toes to knee is provided as well as bilateral pressure redistribution heel boots to mitigate edema and prevent pressure injury.  Vascular surgery consulted; offered aortogram with runoff studies, but patient declined citing it was too invasive.  Upon discharge, BLE edema greatly reduced and patient denies dyspnea.  Discharged with Lasix 40 mg twice daily and potassium supplement 10 mEq daily.  Intend close follow-up with cardiology.   CKD Stage 2 Creatinine upon admission 1.93.  Upon discharge, after diuresis, creatinine 1.88.  Unsure of actual creatinine baseline. Within the last year, has been measured as low as 1.18, as high as 2.21.  Data from 2013 0.75-0.84.  Admission 03/20/2019 at [facility] reported CKD stage II with EGFR around 50 and creatinine about 1.38. From data that we are able to see it appears as kidney function has worsened to CKD stage III with a baseline creatinine between 1.6 and 1.8.   Thrombocytopenia: acute, worsening Upon admission, platelets 133 which declined to 115 on day of discharge.  Last measured 140 on 04/26/2020. Baseline appears to be approximately 350. Patient had extensive dark red coalescing petechial rashes across his body with intermittently scattered black eschar.  See photos in media tab. These rashes were found on points of contact across the body, including  bilateral shoulder blades, bilateral posterior upper arms, and elbows.  Initially, concern for  hepatic dysfunction but both PT and PTT were within normal limits.  Suggest repeat measurement at outpatient PCP follow-up.    Issues for Follow Up:  -HFrEF: Consider placing on ACE/ARB, loop diuretic (spironolactone-patient could not tolerate, eplerinone, finerinone), and SGLT2. Finerinone has good data in CKD -Diuresis: follow up with outpatient cardiology to manage continue diuresis -Potassium: Measure potassium, ensure daily 10 mEq replacement sufficient with current diuresis. Patient needs repeat BMP in the next week to ensure he is on the proper dose of potassium supplementation -Creatinine: Measure creatinine given IV diuresis while inpatient -Home health: Prescribed home health care, home PT, home wound care -Inquire about establishing advanced directive or POA -Grieving recent loss of wife in August, watch for complicated grief or developing depression  Significant Procedures: None  Significant Labs and Imaging:  Recent Labs  Lab 04/30/20 1222 05/01/20 0435 05/02/20 0314  WBC 11.0* 9.8 9.6  HGB 16.9 15.6 15.0  HCT 51.1 47.1 45.5  PLT 133* 122* 115*   Recent Labs  Lab 04/26/20 1055 04/26/20 1055 04/30/20 1222 04/30/20 1222 04/30/20 2146 04/30/20 2146 05/01/20 0435 05/01/20 0435 05/01/20 1434 05/02/20 0314  NA 135  --  137  --  136  --  137  --  134* 135  K 5.8*   < > 4.2   < > 2.9*   < > 2.9*   < > 3.3* 3.9  CL 99   < > 95*  --  92*  --  93*  --  92* 93*  CO2 19*   < > 27  --  28  --  30  --  27 30  GLUCOSE 113*  --  114*  --  138*  --  94  --  175* 116*  BUN 57*  --  65*  --  63*  --  60*  --  62* 60*  CREATININE 1.77*   < > 1.93*  --  1.65*  --  1.77*  --  1.88* 1.88*  CALCIUM 10.0   < > 9.6  --  9.3  --  9.1  --  8.7* 8.7*  MG 2.4*  --   --   --   --   --   --   --  1.5* 2.1  ALKPHOS  --   --   --   --  121  --  120  --   --   --   AST  --   --   --   --  26  --  26  --   --   --   ALT  --   --   --   --  105*  --  103*  --   --   --   ALBUMIN  --   --    --   --  3.0*  --  3.1*  --   --   --    < > = values in this interval not displayed.     Results/Tests Pending at Time of Discharge: None  Discharge Medications:  Allergies as of 05/02/2020      Reactions   Lisinopril Cough   Pt reports significant cough   Losartan Other (See Comments)   Pt reported cough and chest pain      Medication List    STOP taking these medications   amLODipine 10 MG tablet Commonly known as: NORVASC  metolazone 5 MG tablet Commonly known as: ZAROXOLYN   potassium chloride SA 20 MEQ tablet Commonly known as: KLOR-CON   sucralfate 1 g tablet Commonly known as: CARAFATE     TAKE these medications   acetaminophen 325 MG tablet Commonly known as: TYLENOL Take 650 mg by mouth every 6 (six) hours as needed for mild pain.   albuterol 108 (90 Base) MCG/ACT inhaler Commonly known as: VENTOLIN HFA Inhale 2 puffs into the lungs daily as needed for wheezing or shortness of breath.   allopurinol 300 MG tablet Commonly known as: ZYLOPRIM Take 300 mg by mouth daily.   atorvastatin 20 MG tablet Commonly known as: LIPITOR Take 20 mg by mouth daily. What changed: Another medication with the same name was removed. Continue taking this medication, and follow the directions you see here.   beta carotene w/minerals tablet Take 1 tablet by mouth daily.   famotidine 40 MG tablet Commonly known as: PEPCID Take 40 mg by mouth every evening.   ferrous sulfate 324 MG Tbec Take 324 mg by mouth daily with breakfast.   furosemide 40 MG tablet Commonly known as: LASIX Take 1 tablet (40 mg total) by mouth 2 (two) times daily.   gabapentin 300 MG capsule Commonly known as: NEURONTIN Take 300 mg by mouth at bedtime.   HYDROcodone-acetaminophen 5-325 MG tablet Commonly known as: NORCO/VICODIN Take 1 tablet by mouth every 6 (six) hours as needed for moderate pain.   isosorbide mononitrate 60 MG 24 hr tablet Commonly known as: IMDUR TAKE 1 TABLET(60 MG)  BY MOUTH DAILY What changed: See the new instructions.   metoprolol succinate 25 MG 24 hr tablet Commonly known as: TOPROL-XL Take 0.5 tablets (12.5 mg total) by mouth daily. Start taking on: May 03, 2020 What changed: See the new instructions.   nitroGLYCERIN 0.4 MG SL tablet Commonly known as: NITROSTAT Place 1 tablet (0.4 mg total) under the tongue every 5 (five) minutes as needed. What changed: reasons to take this   pantoprazole 40 MG tablet Commonly known as: PROTONIX Take 40 mg by mouth 2 (two) times daily.   potassium chloride 10 MEQ tablet Commonly known as: KLOR-CON Take 1 tablet (10 mEq total) by mouth daily.   Symbicort 160-4.5 MCG/ACT inhaler Generic drug: budesonide-formoterol Inhale 2 puffs into the lungs daily as needed (for asthma).       Discharge Instructions: Please refer to Patient Instructions section of EMR for full details.  Patient was counseled important signs and symptoms that should prompt return to medical care, changes in medications, dietary instructions, activity restrictions, and follow up appointments.   Follow-Up Appointments:  Follow-up Information    Ernestene Kiel, MD. Go on 05/12/2020.   Specialty: Internal Medicine Why: @9 :00am Contact information: [address] [place] [address] [phone]        [name], DO Follow up.   Specialty: Cardiology Why: Keep follow-up appointment as scheduled with Dr. Harriet Masson tomorrow Wednesday May 03, 2020 at 4:00 PM. Contact information: [place] [address] [phone]        Care, [facility] Follow up.   Specialty: Home Health Services Why: HHRN,HHOT Contact information: [place] [address] [phone]               Gifford Shave, MD 05/02/2020, 5:51 PM PGY-2, [place]

## 2020-05-02 NOTE — TOC Transition Note (Signed)
Transition of Care [facility]) - CM/SW Discharge Note   Patient Details  Name: Richard Townsend MRN: [id number] Date of Birth: 03-05-1941  Transition of Care [facility]) CM/SW Contact:  Zenon Mayo, RN Phone Number: 05/02/2020, 2:04 PM   Clinical Narrative:    NCM offered choice, he states he does not have a preference, NCM made referral to [facility] with [facility], he is able to take referral for [facility] [place].  Soc will begin 24 to 48 hrs post dc.  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 His son n law is in the room and states they go check on him often.     Final next level of care: [place] Barriers to Discharge: No Barriers Identified   Patient Goals and CMS Choice Patient states their goals for this hospitalization and ongoing recovery are:: get better CMS Medicare.gov Compare Post Acute Care list provided to:: Patient Choice offered to / list presented to : Patient  Discharge Placement                       Discharge Plan and Services                  DME Agency: NA       HH Arranged: RN, Disease Management, OT [place] Agency: [place] Date [place]: 05/02/20 Time [place]: [place] Representative spoke with at [place]: [place] ([place]) Interventions     Readmission Risk Interventions No flowsheet data found.

## 2020-05-03 ENCOUNTER — Ambulatory Visit: Payer: Medicare HMO | Admitting: Cardiology

## 2020-05-03 ENCOUNTER — Encounter (HOSPITAL_COMMUNITY): Payer: Medicare HMO

## 2020-05-03 ENCOUNTER — Other Ambulatory Visit (HOSPITAL_COMMUNITY): Payer: Medicare HMO

## 2020-05-05 DIAGNOSIS — L97528 Non-pressure chronic ulcer of other part of left foot with other specified severity: Secondary | ICD-10-CM | POA: Diagnosis not present

## 2020-05-05 DIAGNOSIS — I872 Venous insufficiency (chronic) (peripheral): Secondary | ICD-10-CM | POA: Diagnosis not present

## 2020-05-05 DIAGNOSIS — N182 Chronic kidney disease, stage 2 (mild): Secondary | ICD-10-CM | POA: Diagnosis not present

## 2020-05-05 DIAGNOSIS — M1030 Gout due to renal impairment, unspecified site: Secondary | ICD-10-CM | POA: Diagnosis not present

## 2020-05-05 DIAGNOSIS — I130 Hypertensive heart and chronic kidney disease with heart failure and stage 1 through stage 4 chronic kidney disease, or unspecified chronic kidney disease: Secondary | ICD-10-CM | POA: Diagnosis not present

## 2020-05-05 DIAGNOSIS — I2510 Atherosclerotic heart disease of native coronary artery without angina pectoris: Secondary | ICD-10-CM | POA: Diagnosis not present

## 2020-05-05 DIAGNOSIS — D631 Anemia in chronic kidney disease: Secondary | ICD-10-CM | POA: Diagnosis not present

## 2020-05-05 DIAGNOSIS — I509 Heart failure, unspecified: Secondary | ICD-10-CM | POA: Diagnosis not present

## 2020-05-05 DIAGNOSIS — I5022 Chronic systolic (congestive) heart failure: Secondary | ICD-10-CM | POA: Diagnosis not present

## 2020-05-05 DIAGNOSIS — I252 Old myocardial infarction: Secondary | ICD-10-CM | POA: Diagnosis not present

## 2020-05-09 DIAGNOSIS — L97528 Non-pressure chronic ulcer of other part of left foot with other specified severity: Secondary | ICD-10-CM | POA: Diagnosis not present

## 2020-05-09 DIAGNOSIS — N182 Chronic kidney disease, stage 2 (mild): Secondary | ICD-10-CM | POA: Diagnosis not present

## 2020-05-09 DIAGNOSIS — I252 Old myocardial infarction: Secondary | ICD-10-CM | POA: Diagnosis not present

## 2020-05-09 DIAGNOSIS — D631 Anemia in chronic kidney disease: Secondary | ICD-10-CM | POA: Diagnosis not present

## 2020-05-09 DIAGNOSIS — I2510 Atherosclerotic heart disease of native coronary artery without angina pectoris: Secondary | ICD-10-CM | POA: Diagnosis not present

## 2020-05-09 DIAGNOSIS — I872 Venous insufficiency (chronic) (peripheral): Secondary | ICD-10-CM | POA: Diagnosis not present

## 2020-05-09 DIAGNOSIS — I5022 Chronic systolic (congestive) heart failure: Secondary | ICD-10-CM | POA: Diagnosis not present

## 2020-05-09 DIAGNOSIS — M1030 Gout due to renal impairment, unspecified site: Secondary | ICD-10-CM | POA: Diagnosis not present

## 2020-05-09 DIAGNOSIS — I130 Hypertensive heart and chronic kidney disease with heart failure and stage 1 through stage 4 chronic kidney disease, or unspecified chronic kidney disease: Secondary | ICD-10-CM | POA: Diagnosis not present

## 2020-05-10 DIAGNOSIS — I130 Hypertensive heart and chronic kidney disease with heart failure and stage 1 through stage 4 chronic kidney disease, or unspecified chronic kidney disease: Secondary | ICD-10-CM | POA: Diagnosis not present

## 2020-05-10 DIAGNOSIS — I872 Venous insufficiency (chronic) (peripheral): Secondary | ICD-10-CM | POA: Diagnosis not present

## 2020-05-10 DIAGNOSIS — I252 Old myocardial infarction: Secondary | ICD-10-CM | POA: Diagnosis not present

## 2020-05-10 DIAGNOSIS — M1030 Gout due to renal impairment, unspecified site: Secondary | ICD-10-CM | POA: Diagnosis not present

## 2020-05-10 DIAGNOSIS — I5022 Chronic systolic (congestive) heart failure: Secondary | ICD-10-CM | POA: Diagnosis not present

## 2020-05-10 DIAGNOSIS — N182 Chronic kidney disease, stage 2 (mild): Secondary | ICD-10-CM | POA: Diagnosis not present

## 2020-05-10 DIAGNOSIS — D631 Anemia in chronic kidney disease: Secondary | ICD-10-CM | POA: Diagnosis not present

## 2020-05-10 DIAGNOSIS — L97528 Non-pressure chronic ulcer of other part of left foot with other specified severity: Secondary | ICD-10-CM | POA: Diagnosis not present

## 2020-05-10 DIAGNOSIS — I2510 Atherosclerotic heart disease of native coronary artery without angina pectoris: Secondary | ICD-10-CM | POA: Diagnosis not present

## 2020-05-11 DIAGNOSIS — I130 Hypertensive heart and chronic kidney disease with heart failure and stage 1 through stage 4 chronic kidney disease, or unspecified chronic kidney disease: Secondary | ICD-10-CM | POA: Diagnosis not present

## 2020-05-11 DIAGNOSIS — N182 Chronic kidney disease, stage 2 (mild): Secondary | ICD-10-CM | POA: Diagnosis not present

## 2020-05-11 DIAGNOSIS — I252 Old myocardial infarction: Secondary | ICD-10-CM | POA: Diagnosis not present

## 2020-05-11 DIAGNOSIS — I2510 Atherosclerotic heart disease of native coronary artery without angina pectoris: Secondary | ICD-10-CM | POA: Diagnosis not present

## 2020-05-11 DIAGNOSIS — L97528 Non-pressure chronic ulcer of other part of left foot with other specified severity: Secondary | ICD-10-CM | POA: Diagnosis not present

## 2020-05-11 DIAGNOSIS — M1030 Gout due to renal impairment, unspecified site: Secondary | ICD-10-CM | POA: Diagnosis not present

## 2020-05-11 DIAGNOSIS — D631 Anemia in chronic kidney disease: Secondary | ICD-10-CM | POA: Diagnosis not present

## 2020-05-11 DIAGNOSIS — I5022 Chronic systolic (congestive) heart failure: Secondary | ICD-10-CM | POA: Diagnosis not present

## 2020-05-11 DIAGNOSIS — I872 Venous insufficiency (chronic) (peripheral): Secondary | ICD-10-CM | POA: Diagnosis not present

## 2020-05-12 DIAGNOSIS — L97528 Non-pressure chronic ulcer of other part of left foot with other specified severity: Secondary | ICD-10-CM | POA: Diagnosis not present

## 2020-05-12 DIAGNOSIS — N182 Chronic kidney disease, stage 2 (mild): Secondary | ICD-10-CM | POA: Diagnosis not present

## 2020-05-12 DIAGNOSIS — I872 Venous insufficiency (chronic) (peripheral): Secondary | ICD-10-CM | POA: Diagnosis not present

## 2020-05-12 DIAGNOSIS — I2510 Atherosclerotic heart disease of native coronary artery without angina pectoris: Secondary | ICD-10-CM | POA: Diagnosis not present

## 2020-05-12 DIAGNOSIS — D631 Anemia in chronic kidney disease: Secondary | ICD-10-CM | POA: Diagnosis not present

## 2020-05-12 DIAGNOSIS — G8929 Other chronic pain: Secondary | ICD-10-CM | POA: Diagnosis not present

## 2020-05-12 DIAGNOSIS — Z6824 Body mass index (BMI) 24.0-24.9, adult: Secondary | ICD-10-CM | POA: Diagnosis not present

## 2020-05-12 DIAGNOSIS — J449 Chronic obstructive pulmonary disease, unspecified: Secondary | ICD-10-CM | POA: Diagnosis not present

## 2020-05-12 DIAGNOSIS — I739 Peripheral vascular disease, unspecified: Secondary | ICD-10-CM | POA: Diagnosis not present

## 2020-05-12 DIAGNOSIS — I130 Hypertensive heart and chronic kidney disease with heart failure and stage 1 through stage 4 chronic kidney disease, or unspecified chronic kidney disease: Secondary | ICD-10-CM | POA: Diagnosis not present

## 2020-05-12 DIAGNOSIS — I509 Heart failure, unspecified: Secondary | ICD-10-CM | POA: Diagnosis not present

## 2020-05-12 DIAGNOSIS — Z8711 Personal history of peptic ulcer disease: Secondary | ICD-10-CM | POA: Diagnosis not present

## 2020-05-12 DIAGNOSIS — Z79899 Other long term (current) drug therapy: Secondary | ICD-10-CM | POA: Diagnosis not present

## 2020-05-12 DIAGNOSIS — I252 Old myocardial infarction: Secondary | ICD-10-CM | POA: Diagnosis not present

## 2020-05-12 DIAGNOSIS — M1030 Gout due to renal impairment, unspecified site: Secondary | ICD-10-CM | POA: Diagnosis not present

## 2020-05-12 DIAGNOSIS — I5022 Chronic systolic (congestive) heart failure: Secondary | ICD-10-CM | POA: Diagnosis not present

## 2020-05-16 DIAGNOSIS — M1030 Gout due to renal impairment, unspecified site: Secondary | ICD-10-CM | POA: Diagnosis not present

## 2020-05-16 DIAGNOSIS — I252 Old myocardial infarction: Secondary | ICD-10-CM | POA: Diagnosis not present

## 2020-05-16 DIAGNOSIS — I872 Venous insufficiency (chronic) (peripheral): Secondary | ICD-10-CM | POA: Diagnosis not present

## 2020-05-16 DIAGNOSIS — L97528 Non-pressure chronic ulcer of other part of left foot with other specified severity: Secondary | ICD-10-CM | POA: Diagnosis not present

## 2020-05-16 DIAGNOSIS — I130 Hypertensive heart and chronic kidney disease with heart failure and stage 1 through stage 4 chronic kidney disease, or unspecified chronic kidney disease: Secondary | ICD-10-CM | POA: Diagnosis not present

## 2020-05-16 DIAGNOSIS — D631 Anemia in chronic kidney disease: Secondary | ICD-10-CM | POA: Diagnosis not present

## 2020-05-16 DIAGNOSIS — I5022 Chronic systolic (congestive) heart failure: Secondary | ICD-10-CM | POA: Diagnosis not present

## 2020-05-16 DIAGNOSIS — I2510 Atherosclerotic heart disease of native coronary artery without angina pectoris: Secondary | ICD-10-CM | POA: Diagnosis not present

## 2020-05-16 DIAGNOSIS — N182 Chronic kidney disease, stage 2 (mild): Secondary | ICD-10-CM | POA: Diagnosis not present

## 2020-05-18 DIAGNOSIS — I872 Venous insufficiency (chronic) (peripheral): Secondary | ICD-10-CM | POA: Diagnosis not present

## 2020-05-18 DIAGNOSIS — M1030 Gout due to renal impairment, unspecified site: Secondary | ICD-10-CM | POA: Diagnosis not present

## 2020-05-18 DIAGNOSIS — I2510 Atherosclerotic heart disease of native coronary artery without angina pectoris: Secondary | ICD-10-CM | POA: Diagnosis not present

## 2020-05-18 DIAGNOSIS — I252 Old myocardial infarction: Secondary | ICD-10-CM | POA: Diagnosis not present

## 2020-05-18 DIAGNOSIS — N182 Chronic kidney disease, stage 2 (mild): Secondary | ICD-10-CM | POA: Diagnosis not present

## 2020-05-18 DIAGNOSIS — L97528 Non-pressure chronic ulcer of other part of left foot with other specified severity: Secondary | ICD-10-CM | POA: Diagnosis not present

## 2020-05-18 DIAGNOSIS — I5022 Chronic systolic (congestive) heart failure: Secondary | ICD-10-CM | POA: Diagnosis not present

## 2020-05-18 DIAGNOSIS — I130 Hypertensive heart and chronic kidney disease with heart failure and stage 1 through stage 4 chronic kidney disease, or unspecified chronic kidney disease: Secondary | ICD-10-CM | POA: Diagnosis not present

## 2020-05-18 DIAGNOSIS — D631 Anemia in chronic kidney disease: Secondary | ICD-10-CM | POA: Diagnosis not present

## 2020-05-19 DIAGNOSIS — D631 Anemia in chronic kidney disease: Secondary | ICD-10-CM | POA: Diagnosis not present

## 2020-05-19 DIAGNOSIS — N182 Chronic kidney disease, stage 2 (mild): Secondary | ICD-10-CM | POA: Diagnosis not present

## 2020-05-19 DIAGNOSIS — I5022 Chronic systolic (congestive) heart failure: Secondary | ICD-10-CM | POA: Diagnosis not present

## 2020-05-19 DIAGNOSIS — I2510 Atherosclerotic heart disease of native coronary artery without angina pectoris: Secondary | ICD-10-CM | POA: Diagnosis not present

## 2020-05-19 DIAGNOSIS — M1030 Gout due to renal impairment, unspecified site: Secondary | ICD-10-CM | POA: Diagnosis not present

## 2020-05-19 DIAGNOSIS — I872 Venous insufficiency (chronic) (peripheral): Secondary | ICD-10-CM | POA: Diagnosis not present

## 2020-05-19 DIAGNOSIS — I252 Old myocardial infarction: Secondary | ICD-10-CM | POA: Diagnosis not present

## 2020-05-19 DIAGNOSIS — L97528 Non-pressure chronic ulcer of other part of left foot with other specified severity: Secondary | ICD-10-CM | POA: Diagnosis not present

## 2020-05-19 DIAGNOSIS — I130 Hypertensive heart and chronic kidney disease with heart failure and stage 1 through stage 4 chronic kidney disease, or unspecified chronic kidney disease: Secondary | ICD-10-CM | POA: Diagnosis not present

## 2020-05-23 ENCOUNTER — Encounter: Payer: Self-pay | Admitting: Cardiology

## 2020-05-23 ENCOUNTER — Ambulatory Visit: Payer: Medicare HMO | Admitting: Cardiology

## 2020-05-23 ENCOUNTER — Other Ambulatory Visit: Payer: Self-pay

## 2020-05-23 VITALS — BP 150/82 | HR 60 | Ht 65.0 in | Wt 159.2 lb

## 2020-05-23 DIAGNOSIS — E782 Mixed hyperlipidemia: Secondary | ICD-10-CM | POA: Diagnosis not present

## 2020-05-23 DIAGNOSIS — R0989 Other specified symptoms and signs involving the circulatory and respiratory systems: Secondary | ICD-10-CM

## 2020-05-23 DIAGNOSIS — I2510 Atherosclerotic heart disease of native coronary artery without angina pectoris: Secondary | ICD-10-CM

## 2020-05-23 DIAGNOSIS — Z79899 Other long term (current) drug therapy: Secondary | ICD-10-CM | POA: Diagnosis not present

## 2020-05-23 DIAGNOSIS — I10 Essential (primary) hypertension: Secondary | ICD-10-CM

## 2020-05-23 DIAGNOSIS — R931 Abnormal findings on diagnostic imaging of heart and coronary circulation: Secondary | ICD-10-CM

## 2020-05-23 DIAGNOSIS — I5020 Unspecified systolic (congestive) heart failure: Secondary | ICD-10-CM

## 2020-05-23 DIAGNOSIS — I350 Nonrheumatic aortic (valve) stenosis: Secondary | ICD-10-CM | POA: Diagnosis not present

## 2020-05-23 NOTE — Progress Notes (Signed)
Cardiology Office Note:    Date:  05/23/2020   ID:  Richard Townsend, DOB 1940/11/04, MRN [id number]  PCP:  Ernestene Kiel, MD  Cardiologist:  [name], DO  Electrophysiologist:  None   Referring MD: Ernestene Kiel, MD     History of Present Illness:    Richard Townsend is a 79 y.o. male with a hx of coronary artery disease, ischemic cardiomyopathy, recent EF 30 to 35% in his hospitalization, hyperlipidemia, recent GI bleeding is here for follow-up visit.  Patient is also legally blind  I last saw the patient on September 03, 2019 at that time he appeared to be volume overloaded. Initially asked the patient to the emergency department but he would prefer to stay home therefore a increase his Lasix. Blood work came back show significantly elevated BNP I called the patient again request that he go to the emergency department which he did. He was hospitalized at [facility] for acute on chronic heart failure. During his hospitalization he underwent significant diuresis, due to anemia his Eliquis was stopped. His losartan isosorbide nitrate was also started due to borderline hypotension.  Isaw the patient on November 12, 2019 at that time he appears to be euvolemic and continue his current medication doses.   He was seen in 02/2020 at that time he was mourning the loss of his wife.  At that time no medication changes were made as he was at his baseline  I saw the patient on April 26, 2020 at that time he did have some changes have suggested peripheral vascular disease as well as volume overload.  I increase his diuretics and also gave the patient some metolazone, with a follow-up for 1 week.  Since I last saw the patient he was admitted to [facility] to be treated for exacerbation of heart failure.  During his hospitalization he was given IV diuretics and had a repeated echocardiogram which show EF of 30 to 35%.  He also did see wound care during the hospitalization.   Vascular surgery was consulted at that time who recommended aortogram runoff but the patient declined at this time.   Past Medical History:  Diagnosis Date  . AAA (abdominal aortic aneurysm) ([place]) 11/2011  . Abdominal aneurysm without mention of rupture 01/06/2012  . Abdominal tightness 01/11/2013  . Allergic rhinitis   . Arthritis   . DVT (deep venous thrombosis) ([place])   . Dysmetabolic syndrome   . Gastritis and gastroduodenitis   . Generalized osteoarthritis   . GERD (gastroesophageal reflux disease)   . GI bleed 04/13/2019  . GIB (gastrointestinal bleeding) 04/13/2019  . Gout   . Headache(784.0)   . HFrEF (heart failure with reduced ejection fraction) ([place]) 08/06/2019  . History of GI bleed 08/06/2019  . Hypercholesterolemia   . Hypertension   . Ischemic cardiomyopathy 08/06/2019  . Lower extremity edema   . Melena 04/13/2019  . Mixed hyperlipidemia 08/06/2019  . Nausea alone 07/13/2013  . 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 Peptic ulcer   . Shortness of breath   . Symptomatic anemia 04/13/2019  . UGI bleed     Past Surgical History:  Procedure Laterality Date  . ABDOMINAL AORTIC ANEURYSM REPAIR  11/28/11   stent   . ABDOMINAL AORTIC ANEURYSM REPAIR    . APPENDECTOMY    . BIOPSY  04/15/2019   Procedure: BIOPSY;  Surgeon: Thornton Park, MD;  Location: [facility];  Service: Gastroenterology;;  .  cataracts    . ESOPHAGOGASTRODUODENOSCOPY (EGD) WITH PROPOFOL N/A 04/15/2019   Procedure: ESOPHAGOGASTRODUODENOSCOPY (EGD) WITH PROPOFOL;  Surgeon: Thornton Park, MD;  Location: [facility];  Service: Gastroenterology;  Laterality: N/A;  . EYE SURGERY  2009   Left cataract  . 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 FOOT SURGERY    . IR IVC FILTER PLMT / S&I /IMG GUID/MOD SED  04/14/2019  . KNEE ARTHROSCOPY    . TONSILLECTOMY      Current Medications: Current Meds  Medication Sig  . acetaminophen (TYLENOL) 325 MG  tablet Take 650 mg by mouth every 6 (six) hours as needed for mild pain.   Marland Kitchen albuterol (VENTOLIN HFA) 108 (90 Base) MCG/ACT inhaler Inhale 2 puffs into the lungs daily as needed for wheezing or shortness of breath.   . allopurinol (ZYLOPRIM) 300 MG tablet Take 300 mg by mouth daily.  Marland Kitchen atorvastatin (LIPITOR) 20 MG tablet Take 20 mg by mouth daily.   . beta carotene w/minerals (OCUVITE) tablet Take 1 tablet by mouth daily.  . famotidine (PEPCID) 40 MG tablet Take 40 mg by mouth every evening.   . ferrous sulfate 324 MG TBEC Take 324 mg by mouth daily with breakfast.  . furosemide (LASIX) 40 MG tablet Take 1 tablet (40 mg total) by mouth 2 (two) times daily.  Marland Kitchen gabapentin (NEURONTIN) 300 MG capsule Take 300 mg by mouth at bedtime.   Marland Kitchen HYDROcodone-acetaminophen (NORCO/VICODIN) 5-325 MG tablet Take 1 tablet by mouth every 6 (six) hours as needed for moderate pain.   . isosorbide mononitrate (IMDUR) 60 MG 24 hr tablet TAKE 1 TABLET(60 MG) BY MOUTH DAILY (Patient taking differently: Take 60 mg by mouth daily.)  . metoprolol succinate (TOPROL-XL) 25 MG 24 hr tablet Take 0.5 tablets (12.5 mg total) by mouth daily.  . pantoprazole (PROTONIX) 40 MG tablet Take 40 mg by mouth daily.  . potassium chloride (KLOR-CON) 10 MEQ tablet Take 1 tablet (10 mEq total) by mouth daily.  . sucralfate (CARAFATE) 1 g tablet Take 1 g by mouth 4 (four) times daily.  . SYMBICORT 160-4.5 MCG/ACT inhaler Inhale 2 puffs into the lungs daily as needed (for asthma).      Allergies:   Lisinopril and Losartan   Social History   Socioeconomic History  . Marital status: Married    Spouse name: Not on file  . Number of children: Not on file  . Years of education: Not on file  . Highest education level: Not on file  Occupational History  . Not on file  Tobacco Use  . Smoking status: Current Every Day Smoker    Packs/day: 0.25    Years: 51.00    Pack years: 12.75    Types: Cigarettes  . Smokeless tobacco: Never Used   Vaping Use  . Vaping Use: Never used  Substance and Sexual Activity  . Alcohol use: No  . Drug use: No  . Sexual activity: Not on file  Other Topics Concern  . Not on file  Social History Narrative  . Not on file   Social Determinants of Health   Financial Resource Strain: Not on file  Food Insecurity: No Food Insecurity  . Worried About Charity fundraiser in the Last Year: Never true  . Ran Out of Food in the Last Year: Never true  Transportation Needs: No Transportation Needs  . Lack of Transportation (Medical): No  . Lack of Transportation (Non-Medical): No  Physical Activity: Not on  file  Stress: Not on file  Social Connections: Not on file     Family History: The patient's family history includes Cancer in his mother and sister; Heart attack in his maternal grandfather; Hypertension in his brother, daughter, mother, and sister; Leukemia in his mother; Prostate cancer in his father.  ROS:   Review of Systems  Constitution: Negative for decreased appetite, fever and weight gain.  HENT: Negative for congestion, ear discharge, hoarse voice and sore throat.   Eyes: Negative for discharge, redness, vision loss in right eye and visual halos.  Cardiovascular: Negative for chest pain, dyspnea on exertion, leg swelling, orthopnea and palpitations.  Respiratory: Negative for cough, hemoptysis, shortness of breath and snoring.   Endocrine: Negative for heat intolerance and polyphagia.  Hematologic/Lymphatic: Negative for bleeding problem. Does not bruise/bleed easily.  Skin: Negative for flushing, nail changes, rash and suspicious lesions.  Musculoskeletal: Negative for arthritis, joint pain, muscle cramps, myalgias, neck pain and stiffness.  Gastrointestinal: Negative for abdominal pain, bowel incontinence, diarrhea and excessive appetite.  Genitourinary: Negative for decreased libido, genital sores and incomplete emptying.  Neurological: Negative for brief paralysis, focal  weakness, headaches and loss of balance.  Psychiatric/Behavioral: Negative for altered mental status, depression and suicidal ideas.  Allergic/Immunologic: Negative for HIV exposure and persistent infections.    EKGs/Labs/Other Studies Reviewed:    The following studies were reviewed today:   EKG:  The ekg ordered today demonstrates    TTE IMPRESSIONS  1. Left ventricular ejection fraction, by estimation, is 30 to 35%. The left ventricle has moderately decreased function. The left ventricle  demonstrates global hypokinesis. There is moderate left ventricular hypertrophy. Left ventricular diastolic  function could not be evaluated.  2. Right ventricular systolic function is normal. The right ventricular size is normal. There is normal pulmonary artery systolic pressure. The estimated right ventricular systolic pressure is 70.1 mmHg.  3. Left atrial size was moderately dilated.  4. Right atrial size was moderately dilated.  5. The mitral valve is abnormal. Mild mitral valve regurgitation. Moderate mitral annular calcification. Reduced excursion of the anterior  mitral leaflet due to posteriorly directed AI, causing at probably mild mitral stenosis.  6. Tricuspid valve regurgitation is moderate.  7. The aortic valve is calcified. Aortic valve regurgitation is mild to moderate. The AI jet is posteriorly directed and impair anterior excursion of the mitral leaflet. Moderate to severe aortic valve stenosis. Aortic valve area, by VTI measures 0.72  cm. Aortic valve mean gradient measures 16 mmHg. Aortic valve Vmax measures 2.61 m/s. DI is 0.21.  8. The inferior vena cava is normal in size with greater than 50% respiratory variability, suggesting right atrial pressure of 3 mmHg.   Recent Labs: 09/03/2019: NT-Pro BNP 17,585 04/30/2020: B Natriuretic Peptide 3,258.8 05/01/2020: ALT 103 05/02/2020: BUN 60; Creatinine, Ser 1.88; Hemoglobin 15.0; Magnesium 2.1; Platelets 115; Potassium 3.9;  Sodium 135  Recent Lipid Panel No results found for: CHOL, TRIG, HDL, CHOLHDL, VLDL, LDLCALC, LDLDIRECT  Physical Exam:    VS:  BP (!) 150/82   Pulse 60   Ht 5\' 5"  (1.651 m)   Wt 159 lb 3.2 oz (72.2 kg)   SpO2 98%   BMI 26.49 kg/m     Wt Readings from Last 3 Encounters:  05/23/20 159 lb 3.2 oz (72.2 kg)  05/02/20 153 lb 3.2 oz (69.5 kg)  04/26/20 173 lb 6.4 oz (78.7 kg)     GEN: Well nourished, well developed in no acute distress HEENT: Normal NECK: No  JVD; No carotid bruits LYMPHATICS: No lymphadenopathy CARDIAC: S1S2 noted,RRR, no murmurs, rubs, gallops RESPIRATORY:  Clear to auscultation without rales, wheezing or rhonchi  ABDOMEN: Soft, non-tender, non-distended, +bowel sounds, no guarding. EXTREMITIES: No edema, No cyanosis, no clubbing MUSCULOSKELETAL:  No deformity  SKIN: Warm and dry NEUROLOGIC:  Alert and oriented x 3, non-focal PSYCHIATRIC:  Normal affect, good insight  ASSESSMENT:    1. Severe aortic stenosis   2. Aortic valve stenosis, etiology of cardiac valve disease unspecified   3. Depressed left ventricular ejection fraction   4. HFrEF (heart failure with reduced ejection fraction) ([place])   5. Essential hypertension   6. Mild CAD   7. Primary hypertension   8. Mixed hyperlipidemia    PLAN:    Heart failure with reduced ejection fraction-his repeated echocardiogram still show EF of 30 to 35%.  Is been challenging with this patient as he was not able to tolerate lisinopril and losartan and got significantly hypotensive on Entresto.  He has had hyperkalemia recently so I am holding off on adding Aldactone.  He tells me he just got some blood work done by his PCP so I am going to request this.  Once reviewed if his potassium is within normal I plan to start low-dose Aldactone.  I also am going to consider vericiguat once he is on optimal medical therapy.  I reviewed his echocardiogram which is concerning for low flow low gradient severe aortic stenosis  (stroke-volume index is 18, aortic valve area by VTI 0.72, mean gradient 50mmHg, velocity 2.6 m/s, dimensionless index 0.21).  I like the patient to be evaluated by our valve team to see if there is any value for TAVR in this patient.  He has agreed for this evaluation for now.   In the meantime, he will also be referred to EP to be evaluated for potential discussion for defibrillator placement.  As noted above he is not quite yet on ideal guideline medical therapy due to his different circumstances of not being able to tolerate some of the medications.   Hypertension-his blood pressure is slightly elevated in the office but he tells me at home he has his home nurse who is reporting blood pressures in the 120s so at this time I am going to have him bring these recordings at his next visit to avoid any type of hypotension in a patient who is have history of multiple events of hypotension.  Peripheral artery disease-at this time he has deferred any further vascular diagnostic testing or intervention.  Coronary artery disease having no anginal symptoms continue his current medication regimen.  Overall we discussed in the office today with his son-in-law who is his POA.  He is considering palliative care which I do not think is a bad idea.  He also is still moaning his wife and is very emotional during this holiday season as Christmas was her best we will continue to monitor the patient.  He does have good support with his family son-in-law and daughter.  He reports significant vomiting since being off his sucrafate so I have restarted this medication for him before he see his PCP   The patient is in agreement with the above plan. The patient left the office in stable condition.  The patient will follow up in 8 weeks.    Medication Adjustments/Labs and Tests Ordered: Current medicines are reviewed at length with the patient today.  Concerns regarding medicines are outlined above.  Orders Placed  This Encounter  Procedures  .  Ambulatory referral to Cardiac Electrophysiology  . Ambulatory referral to Structural Heart/Valve Clinic (only at [place])   No orders of the defined types were placed in this encounter.   Patient Instructions  Medication Instructions:  No medication changes. *If you need a refill on your cardiac medications before your next appointment, please call your pharmacy*   Lab Work: None ordered If you have labs (blood work) drawn today and your tests are completely normal, you will receive your results only by: Marland Kitchen MyChart Message (if you have MyChart) OR . A paper copy in the mail If you have any lab test that is abnormal or we need to change your treatment, we will call you to review the results.   Testing/Procedures: None ordered   Follow-Up: At [facility], you and your health needs are our priority.  As part of our continuing mission to provide you with exceptional heart care, we have created designated Provider Care Teams.  These Care Teams include your primary Cardiologist (physician) and Advanced Practice Providers (APPs -  Physician Assistants and Nurse Practitioners) who all work together to provide you with the care you need, when you need it.  We recommend signing up for the patient portal called "MyChart".  Sign up information is provided on this After Visit Summary.  MyChart is used to connect with patients for Virtual Visits (Telemedicine).  Patients are able to view lab/test results, encounter notes, upcoming appointments, etc.  Non-urgent messages can be sent to your provider as well.   To learn more about what you can do with MyChart, go to [email or web address].    Your next appointment:   8 week(s)  The format for your next appointment:   In Person  Provider:   [name], DO   Other Instructions NA     Adopting a Healthy Lifestyle.  Know what a healthy weight is for you (roughly BMI <25) and aim to maintain this    Aim for 7+ servings of fruits and vegetables daily   65-80+ fluid ounces of water or unsweet tea for healthy kidneys   Limit to max 1 drink of alcohol per day; avoid smoking/tobacco   Limit animal fats in diet for cholesterol and heart health - choose grass fed whenever available   Avoid highly processed foods, and foods high in saturated/trans fats   Aim for low stress - take time to unwind and care for your mental health   Aim for 150 min of moderate intensity exercise weekly for heart health, and weights twice weekly for bone health   Aim for 7-9 hours of sleep daily   When it comes to diets, agreement about the perfect plan isnt easy to find, even among the experts. Experts at the [employer] developed an idea known as the Healthy Eating Plate. Just imagine a plate divided into logical, healthy portions.   The emphasis is on diet quality:   Load up on vegetables and fruits - one-half of your plate: Aim for color and variety, and remember that potatoes dont count.   Go for whole grains - one-quarter of your plate: Whole wheat, barley, wheat berries, quinoa, oats, brown rice, and foods made with them. If you want pasta, go with whole wheat pasta.   Protein power - one-quarter of your plate: Fish, chicken, beans, and nuts are all healthy, versatile protein sources. Limit red meat.   The diet, however, does go beyond the plate, offering a few other suggestions.   Use  healthy plant oils, such as olive, canola, soy, corn, sunflower and peanut. Check the labels, and avoid partially hydrogenated oil, which have unhealthy trans fats.   If youre thirsty, drink water. Coffee and tea are good in moderation, but skip sugary drinks and limit milk and dairy products to one or two daily servings.   The type of carbohydrate in the diet is more important than the amount. Some sources of carbohydrates, such as vegetables, fruits, whole grains, and beans-are healthier than  others.   Finally, stay active  Signed, [name], DO  05/23/2020 1:13 PM    [facility]

## 2020-05-23 NOTE — Patient Instructions (Signed)
Medication Instructions:  No medication changes. *If you need a refill on your cardiac medications before your next appointment, please call your pharmacy*   Lab Work: None ordered If you have labs (blood work) drawn today and your tests are completely normal, you will receive your results only by: Marland Kitchen MyChart Message (if you have MyChart) OR . A paper copy in the mail If you have any lab test that is abnormal or we need to change your treatment, we will call you to review the results.   Testing/Procedures: None ordered   Follow-Up: At [facility], you and your health needs are our priority.  As part of our continuing mission to provide you with exceptional heart care, we have created designated Provider Care Teams.  These Care Teams include your primary Cardiologist (physician) and Advanced Practice Providers (APPs -  Physician Assistants and Nurse Practitioners) who all work together to provide you with the care you need, when you need it.  We recommend signing up for the patient portal called "MyChart".  Sign up information is provided on this After Visit Summary.  MyChart is used to connect with patients for Virtual Visits (Telemedicine).  Patients are able to view lab/test results, encounter notes, upcoming appointments, etc.  Non-urgent messages can be sent to your provider as well.   To learn more about what you can do with MyChart, go to [email or web address].    Your next appointment:   8 week(s)  The format for your next appointment:   In Person  Provider:   [name], DO   Other Instructions NA

## 2020-05-24 DIAGNOSIS — D631 Anemia in chronic kidney disease: Secondary | ICD-10-CM | POA: Diagnosis not present

## 2020-05-24 DIAGNOSIS — M1030 Gout due to renal impairment, unspecified site: Secondary | ICD-10-CM | POA: Diagnosis not present

## 2020-05-24 DIAGNOSIS — I5022 Chronic systolic (congestive) heart failure: Secondary | ICD-10-CM | POA: Diagnosis not present

## 2020-05-24 DIAGNOSIS — I2510 Atherosclerotic heart disease of native coronary artery without angina pectoris: Secondary | ICD-10-CM | POA: Diagnosis not present

## 2020-05-24 DIAGNOSIS — I130 Hypertensive heart and chronic kidney disease with heart failure and stage 1 through stage 4 chronic kidney disease, or unspecified chronic kidney disease: Secondary | ICD-10-CM | POA: Diagnosis not present

## 2020-05-24 DIAGNOSIS — L97528 Non-pressure chronic ulcer of other part of left foot with other specified severity: Secondary | ICD-10-CM | POA: Diagnosis not present

## 2020-05-24 DIAGNOSIS — I252 Old myocardial infarction: Secondary | ICD-10-CM | POA: Diagnosis not present

## 2020-05-24 DIAGNOSIS — N182 Chronic kidney disease, stage 2 (mild): Secondary | ICD-10-CM | POA: Diagnosis not present

## 2020-05-24 DIAGNOSIS — I872 Venous insufficiency (chronic) (peripheral): Secondary | ICD-10-CM | POA: Diagnosis not present

## 2020-05-25 DIAGNOSIS — I130 Hypertensive heart and chronic kidney disease with heart failure and stage 1 through stage 4 chronic kidney disease, or unspecified chronic kidney disease: Secondary | ICD-10-CM | POA: Diagnosis not present

## 2020-05-25 DIAGNOSIS — N182 Chronic kidney disease, stage 2 (mild): Secondary | ICD-10-CM | POA: Diagnosis not present

## 2020-05-25 DIAGNOSIS — I5022 Chronic systolic (congestive) heart failure: Secondary | ICD-10-CM | POA: Diagnosis not present

## 2020-05-25 DIAGNOSIS — I252 Old myocardial infarction: Secondary | ICD-10-CM | POA: Diagnosis not present

## 2020-05-25 DIAGNOSIS — I872 Venous insufficiency (chronic) (peripheral): Secondary | ICD-10-CM | POA: Diagnosis not present

## 2020-05-25 DIAGNOSIS — L97528 Non-pressure chronic ulcer of other part of left foot with other specified severity: Secondary | ICD-10-CM | POA: Diagnosis not present

## 2020-05-25 DIAGNOSIS — D631 Anemia in chronic kidney disease: Secondary | ICD-10-CM | POA: Diagnosis not present

## 2020-05-25 DIAGNOSIS — I2510 Atherosclerotic heart disease of native coronary artery without angina pectoris: Secondary | ICD-10-CM | POA: Diagnosis not present

## 2020-05-25 DIAGNOSIS — M1030 Gout due to renal impairment, unspecified site: Secondary | ICD-10-CM | POA: Diagnosis not present

## 2020-05-29 ENCOUNTER — Other Ambulatory Visit: Payer: Self-pay | Admitting: Family Medicine

## 2020-05-29 DIAGNOSIS — I130 Hypertensive heart and chronic kidney disease with heart failure and stage 1 through stage 4 chronic kidney disease, or unspecified chronic kidney disease: Secondary | ICD-10-CM | POA: Diagnosis not present

## 2020-05-29 DIAGNOSIS — I872 Venous insufficiency (chronic) (peripheral): Secondary | ICD-10-CM | POA: Diagnosis not present

## 2020-05-29 DIAGNOSIS — I5022 Chronic systolic (congestive) heart failure: Secondary | ICD-10-CM | POA: Diagnosis not present

## 2020-05-29 DIAGNOSIS — I2510 Atherosclerotic heart disease of native coronary artery without angina pectoris: Secondary | ICD-10-CM | POA: Diagnosis not present

## 2020-05-29 DIAGNOSIS — I252 Old myocardial infarction: Secondary | ICD-10-CM | POA: Diagnosis not present

## 2020-05-29 DIAGNOSIS — D631 Anemia in chronic kidney disease: Secondary | ICD-10-CM | POA: Diagnosis not present

## 2020-05-29 DIAGNOSIS — M1030 Gout due to renal impairment, unspecified site: Secondary | ICD-10-CM | POA: Diagnosis not present

## 2020-05-29 DIAGNOSIS — N182 Chronic kidney disease, stage 2 (mild): Secondary | ICD-10-CM | POA: Diagnosis not present

## 2020-05-29 DIAGNOSIS — L97528 Non-pressure chronic ulcer of other part of left foot with other specified severity: Secondary | ICD-10-CM | POA: Diagnosis not present

## 2020-05-30 ENCOUNTER — Other Ambulatory Visit: Payer: Self-pay

## 2020-05-30 ENCOUNTER — Encounter (HOSPITAL_COMMUNITY): Payer: Self-pay

## 2020-05-30 ENCOUNTER — Telehealth: Payer: Self-pay | Admitting: Cardiology

## 2020-05-30 ENCOUNTER — Inpatient Hospital Stay (HOSPITAL_COMMUNITY)
Admission: EM | Admit: 2020-05-30 | Discharge: 2020-06-03 | DRG: 291 | Disposition: A | Discharge status: Hospice | Payer: Medicare HMO | Attending: Family Medicine | Admitting: Family Medicine

## 2020-05-30 DIAGNOSIS — R002 Palpitations: Secondary | ICD-10-CM | POA: Diagnosis present

## 2020-05-30 DIAGNOSIS — I517 Cardiomegaly: Secondary | ICD-10-CM | POA: Diagnosis not present

## 2020-05-30 DIAGNOSIS — I130 Hypertensive heart and chronic kidney disease with heart failure and stage 1 through stage 4 chronic kidney disease, or unspecified chronic kidney disease: Secondary | ICD-10-CM | POA: Diagnosis not present

## 2020-05-30 DIAGNOSIS — K2970 Gastritis, unspecified, without bleeding: Secondary | ICD-10-CM | POA: Diagnosis present

## 2020-05-30 DIAGNOSIS — Z86718 Personal history of other venous thrombosis and embolism: Secondary | ICD-10-CM | POA: Diagnosis not present

## 2020-05-30 DIAGNOSIS — I5023 Acute on chronic systolic (congestive) heart failure: Secondary | ICD-10-CM | POA: Diagnosis not present

## 2020-05-30 DIAGNOSIS — I824Y3 Acute embolism and thrombosis of unspecified deep veins of proximal lower extremity, bilateral: Secondary | ICD-10-CM

## 2020-05-30 DIAGNOSIS — Z95828 Presence of other vascular implants and grafts: Secondary | ICD-10-CM

## 2020-05-30 DIAGNOSIS — I2510 Atherosclerotic heart disease of native coronary artery without angina pectoris: Secondary | ICD-10-CM | POA: Diagnosis present

## 2020-05-30 DIAGNOSIS — Z888 Allergy status to other drugs, medicaments and biological substances status: Secondary | ICD-10-CM | POA: Diagnosis not present

## 2020-05-30 DIAGNOSIS — I5041 Acute combined systolic (congestive) and diastolic (congestive) heart failure: Secondary | ICD-10-CM | POA: Diagnosis not present

## 2020-05-30 DIAGNOSIS — I252 Old myocardial infarction: Secondary | ICD-10-CM

## 2020-05-30 DIAGNOSIS — R1110 Vomiting, unspecified: Secondary | ICD-10-CM | POA: Diagnosis present

## 2020-05-30 DIAGNOSIS — M79672 Pain in left foot: Secondary | ICD-10-CM | POA: Diagnosis present

## 2020-05-30 DIAGNOSIS — M79671 Pain in right foot: Secondary | ICD-10-CM | POA: Diagnosis present

## 2020-05-30 DIAGNOSIS — F17210 Nicotine dependence, cigarettes, uncomplicated: Secondary | ICD-10-CM | POA: Diagnosis present

## 2020-05-30 DIAGNOSIS — Z515 Encounter for palliative care: Secondary | ICD-10-CM

## 2020-05-30 DIAGNOSIS — Z806 Family history of leukemia: Secondary | ICD-10-CM

## 2020-05-30 DIAGNOSIS — Z602 Problems related to living alone: Secondary | ICD-10-CM

## 2020-05-30 DIAGNOSIS — I70263 Atherosclerosis of native arteries of extremities with gangrene, bilateral legs: Secondary | ICD-10-CM | POA: Diagnosis not present

## 2020-05-30 DIAGNOSIS — M7989 Other specified soft tissue disorders: Secondary | ICD-10-CM | POA: Diagnosis present

## 2020-05-30 DIAGNOSIS — I739 Peripheral vascular disease, unspecified: Secondary | ICD-10-CM

## 2020-05-30 DIAGNOSIS — F09 Unspecified mental disorder due to known physiological condition: Secondary | ICD-10-CM | POA: Diagnosis present

## 2020-05-30 DIAGNOSIS — R0602 Shortness of breath: Secondary | ICD-10-CM | POA: Diagnosis not present

## 2020-05-30 DIAGNOSIS — Z7951 Long term (current) use of inhaled steroids: Secondary | ICD-10-CM

## 2020-05-30 DIAGNOSIS — E8770 Fluid overload, unspecified: Secondary | ICD-10-CM | POA: Diagnosis not present

## 2020-05-30 DIAGNOSIS — Z8042 Family history of malignant neoplasm of prostate: Secondary | ICD-10-CM

## 2020-05-30 DIAGNOSIS — M2550 Pain in unspecified joint: Secondary | ICD-10-CM | POA: Diagnosis not present

## 2020-05-30 DIAGNOSIS — I255 Ischemic cardiomyopathy: Secondary | ICD-10-CM | POA: Diagnosis present

## 2020-05-30 DIAGNOSIS — R339 Retention of urine, unspecified: Secondary | ICD-10-CM | POA: Diagnosis not present

## 2020-05-30 DIAGNOSIS — I70203 Unspecified atherosclerosis of native arteries of extremities, bilateral legs: Secondary | ICD-10-CM | POA: Diagnosis present

## 2020-05-30 DIAGNOSIS — R34 Anuria and oliguria: Secondary | ICD-10-CM | POA: Diagnosis not present

## 2020-05-30 DIAGNOSIS — E782 Mixed hyperlipidemia: Secondary | ICD-10-CM | POA: Diagnosis present

## 2020-05-30 DIAGNOSIS — N179 Acute kidney failure, unspecified: Secondary | ICD-10-CM | POA: Diagnosis not present

## 2020-05-30 DIAGNOSIS — I509 Heart failure, unspecified: Secondary | ICD-10-CM

## 2020-05-30 DIAGNOSIS — R059 Cough, unspecified: Secondary | ICD-10-CM | POA: Diagnosis present

## 2020-05-30 DIAGNOSIS — Z8249 Family history of ischemic heart disease and other diseases of the circulatory system: Secondary | ICD-10-CM

## 2020-05-30 DIAGNOSIS — I82411 Acute embolism and thrombosis of right femoral vein: Secondary | ICD-10-CM | POA: Diagnosis not present

## 2020-05-30 DIAGNOSIS — Z20822 Contact with and (suspected) exposure to covid-19: Secondary | ICD-10-CM | POA: Diagnosis present

## 2020-05-30 DIAGNOSIS — R4182 Altered mental status, unspecified: Secondary | ICD-10-CM | POA: Diagnosis not present

## 2020-05-30 DIAGNOSIS — Z66 Do not resuscitate: Secondary | ICD-10-CM | POA: Diagnosis present

## 2020-05-30 DIAGNOSIS — Z8711 Personal history of peptic ulcer disease: Secondary | ICD-10-CM | POA: Diagnosis not present

## 2020-05-30 DIAGNOSIS — M1A9XX0 Chronic gout, unspecified, without tophus (tophi): Secondary | ICD-10-CM | POA: Diagnosis present

## 2020-05-30 DIAGNOSIS — R609 Edema, unspecified: Secondary | ICD-10-CM | POA: Diagnosis not present

## 2020-05-30 DIAGNOSIS — Z7189 Other specified counseling: Secondary | ICD-10-CM | POA: Diagnosis not present

## 2020-05-30 DIAGNOSIS — G9389 Other specified disorders of brain: Secondary | ICD-10-CM | POA: Diagnosis not present

## 2020-05-30 DIAGNOSIS — Z7401 Bed confinement status: Secondary | ICD-10-CM | POA: Diagnosis not present

## 2020-05-30 DIAGNOSIS — I700 Atherosclerosis of aorta: Secondary | ICD-10-CM | POA: Diagnosis not present

## 2020-05-30 DIAGNOSIS — D696 Thrombocytopenia, unspecified: Secondary | ICD-10-CM | POA: Diagnosis present

## 2020-05-30 DIAGNOSIS — N1832 Chronic kidney disease, stage 3b: Secondary | ICD-10-CM | POA: Diagnosis not present

## 2020-05-30 DIAGNOSIS — I110 Hypertensive heart disease with heart failure: Secondary | ICD-10-CM | POA: Diagnosis not present

## 2020-05-30 DIAGNOSIS — J449 Chronic obstructive pulmonary disease, unspecified: Secondary | ICD-10-CM | POA: Diagnosis present

## 2020-05-30 DIAGNOSIS — E871 Hypo-osmolality and hyponatremia: Secondary | ICD-10-CM | POA: Diagnosis not present

## 2020-05-30 DIAGNOSIS — Z79899 Other long term (current) drug therapy: Secondary | ICD-10-CM

## 2020-05-30 DIAGNOSIS — S90822A Blister (nonthermal), left foot, initial encounter: Secondary | ICD-10-CM | POA: Diagnosis present

## 2020-05-30 DIAGNOSIS — N183 Chronic kidney disease, stage 3 unspecified: Secondary | ICD-10-CM | POA: Diagnosis not present

## 2020-05-30 DIAGNOSIS — R54 Age-related physical debility: Secondary | ICD-10-CM | POA: Diagnosis present

## 2020-05-30 DIAGNOSIS — Z8719 Personal history of other diseases of the digestive system: Secondary | ICD-10-CM

## 2020-05-30 DIAGNOSIS — F4320 Adjustment disorder, unspecified: Secondary | ICD-10-CM | POA: Diagnosis present

## 2020-05-30 DIAGNOSIS — K219 Gastro-esophageal reflux disease without esophagitis: Secondary | ICD-10-CM | POA: Diagnosis present

## 2020-05-30 DIAGNOSIS — R404 Transient alteration of awareness: Secondary | ICD-10-CM | POA: Diagnosis not present

## 2020-05-30 DIAGNOSIS — I82492 Acute embolism and thrombosis of other specified deep vein of left lower extremity: Secondary | ICD-10-CM | POA: Diagnosis not present

## 2020-05-30 DIAGNOSIS — Z8679 Personal history of other diseases of the circulatory system: Secondary | ICD-10-CM

## 2020-05-30 DIAGNOSIS — S90821A Blister (nonthermal), right foot, initial encounter: Secondary | ICD-10-CM | POA: Diagnosis present

## 2020-05-30 DIAGNOSIS — R066 Hiccough: Secondary | ICD-10-CM | POA: Diagnosis present

## 2020-05-30 LAB — CBC
HCT: 45.7 % (ref 39.0–52.0)
Hemoglobin: 14.9 g/dL (ref 13.0–17.0)
MCH: 29.7 pg (ref 26.0–34.0)
MCHC: 32.6 g/dL (ref 30.0–36.0)
MCV: 91.2 fL (ref 80.0–100.0)
Platelets: 106 10*3/uL — ABNORMAL LOW (ref 150–400)
RBC: 5.01 MIL/uL (ref 4.22–5.81)
RDW: 18.9 % — ABNORMAL HIGH (ref 11.5–15.5)
WBC: 14.3 10*3/uL — ABNORMAL HIGH (ref 4.0–10.5)
nRBC: 1.1 % — ABNORMAL HIGH (ref 0.0–0.2)

## 2020-05-30 LAB — BASIC METABOLIC PANEL
Anion gap: 16 — ABNORMAL HIGH (ref 5–15)
BUN: 94 mg/dL — ABNORMAL HIGH (ref 8–23)
CO2: 16 mmol/L — ABNORMAL LOW (ref 22–32)
Calcium: 9.3 mg/dL (ref 8.9–10.3)
Chloride: 98 mmol/L (ref 98–111)
Creatinine, Ser: 2.64 mg/dL — ABNORMAL HIGH (ref 0.61–1.24)
GFR, Estimated: 24 mL/min — ABNORMAL LOW (ref >60)
Glucose, Bld: 137 mg/dL — ABNORMAL HIGH (ref 70–99)
Potassium: 4.7 mmol/L (ref 3.5–5.1)
Sodium: 130 mmol/L — ABNORMAL LOW (ref 135–145)

## 2020-05-30 LAB — BRAIN NATRIURETIC PEPTIDE: B Natriuretic Peptide: >4500 pg/mL — ABNORMAL HIGH (ref 0.0–100.0)

## 2020-05-30 NOTE — Telephone Encounter (Signed)
Pt son in law  called in and stated that pt would like to speak the nurse to easy his mind about all the test he is going to have.  He stated that he is not comfortable.  Pt son in law  stated that he really isn't doing well. He thinks pt is just been grieving since wife passed.     Best number [phone]

## 2020-05-30 NOTE — ED Triage Notes (Signed)
Pt here for eval of bilateral foot swelling x3wks, +3 pitting edema on both, pt states pain is worse in L foot. Hx CHF, multiple MI & states he is due for valve assessment to determine need for replacement. States he is complaint with all medications, but reduced lasix dosage from 80 to 60mg  but states that made no difference/increase to swelling.

## 2020-05-31 ENCOUNTER — Emergency Department (HOSPITAL_COMMUNITY): Payer: Medicare HMO

## 2020-05-31 ENCOUNTER — Observation Stay (HOSPITAL_COMMUNITY): Payer: Medicare HMO

## 2020-05-31 ENCOUNTER — Ambulatory Visit (HOSPITAL_COMMUNITY): Payer: Medicare HMO

## 2020-05-31 ENCOUNTER — Observation Stay (HOSPITAL_BASED_OUTPATIENT_CLINIC_OR_DEPARTMENT_OTHER): Payer: Medicare HMO

## 2020-05-31 DIAGNOSIS — R609 Edema, unspecified: Secondary | ICD-10-CM

## 2020-05-31 DIAGNOSIS — I5041 Acute combined systolic (congestive) and diastolic (congestive) heart failure: Secondary | ICD-10-CM | POA: Diagnosis present

## 2020-05-31 DIAGNOSIS — I824Y3 Acute embolism and thrombosis of unspecified deep veins of proximal lower extremity, bilateral: Secondary | ICD-10-CM | POA: Diagnosis not present

## 2020-05-31 DIAGNOSIS — R0602 Shortness of breath: Secondary | ICD-10-CM | POA: Diagnosis not present

## 2020-05-31 DIAGNOSIS — Z86718 Personal history of other venous thrombosis and embolism: Secondary | ICD-10-CM | POA: Diagnosis not present

## 2020-05-31 DIAGNOSIS — I82492 Acute embolism and thrombosis of other specified deep vein of left lower extremity: Secondary | ICD-10-CM

## 2020-05-31 DIAGNOSIS — Z8719 Personal history of other diseases of the digestive system: Secondary | ICD-10-CM | POA: Diagnosis not present

## 2020-05-31 DIAGNOSIS — I739 Peripheral vascular disease, unspecified: Secondary | ICD-10-CM | POA: Insufficient documentation

## 2020-05-31 DIAGNOSIS — I509 Heart failure, unspecified: Secondary | ICD-10-CM | POA: Diagnosis not present

## 2020-05-31 DIAGNOSIS — I700 Atherosclerosis of aorta: Secondary | ICD-10-CM | POA: Diagnosis not present

## 2020-05-31 DIAGNOSIS — I5023 Acute on chronic systolic (congestive) heart failure: Secondary | ICD-10-CM | POA: Diagnosis not present

## 2020-05-31 DIAGNOSIS — Z602 Problems related to living alone: Secondary | ICD-10-CM | POA: Diagnosis not present

## 2020-05-31 DIAGNOSIS — N179 Acute kidney failure, unspecified: Secondary | ICD-10-CM | POA: Diagnosis not present

## 2020-05-31 DIAGNOSIS — I110 Hypertensive heart disease with heart failure: Secondary | ICD-10-CM | POA: Diagnosis not present

## 2020-05-31 DIAGNOSIS — I517 Cardiomegaly: Secondary | ICD-10-CM | POA: Diagnosis not present

## 2020-05-31 LAB — HEPARIN LEVEL (UNFRACTIONATED): Heparin Unfractionated: 0.12 IU/mL — ABNORMAL LOW (ref 0.30–0.70)

## 2020-05-31 LAB — RESP PANEL BY RT-PCR (FLU A&B, COVID) ARPGX2
Influenza A by PCR: NEGATIVE
Influenza B by PCR: NEGATIVE
SARS Coronavirus 2 by RT PCR: NEGATIVE

## 2020-05-31 MED ORDER — PANTOPRAZOLE SODIUM 40 MG PO TBEC: 40.0000 mg | DELAYED_RELEASE_TABLET | Freq: Every day | ORAL | Status: DC

## 2020-05-31 MED ORDER — ATORVASTATIN CALCIUM 10 MG PO TABS
20.0000 mg | ORAL_TABLET | Freq: Every day | ORAL | Status: DC
  Administered 2020-05-31 – 2020-06-02 (×3): 20 mg via ORAL
  Filled 2020-05-31 (×3): qty 2

## 2020-05-31 MED ORDER — SUCRALFATE 1 G PO TABS
1.0000 g | ORAL_TABLET | Freq: Three times a day (TID) | ORAL | Status: DC
  Administered 2020-05-31 – 2020-06-02 (×10): 1 g via ORAL
  Filled 2020-05-31 (×9): qty 1

## 2020-05-31 MED ORDER — ALLOPURINOL 300 MG PO TABS
300.0000 mg | ORAL_TABLET | Freq: Every day | ORAL | Status: DC
  Administered 2020-05-31 – 2020-06-02 (×3): 300 mg via ORAL
  Filled 2020-05-31: qty 3
  Filled 2020-05-31 (×3): qty 1

## 2020-05-31 MED ORDER — ISOSORBIDE MONONITRATE ER 60 MG PO TB24
60.0000 mg | ORAL_TABLET | Freq: Every day | ORAL | Status: DC
  Administered 2020-05-31 – 2020-06-03 (×4): 60 mg via ORAL
  Filled 2020-05-31: qty 2
  Filled 2020-05-31 (×3): qty 1

## 2020-05-31 MED ORDER — FERROUS SULFATE 325 (65 FE) MG PO TABS
325.0000 mg | ORAL_TABLET | Freq: Every day | ORAL | Status: DC
  Administered 2020-06-01 – 2020-06-02 (×2): 325 mg via ORAL
  Filled 2020-05-31 (×2): qty 1

## 2020-05-31 MED ORDER — FUROSEMIDE 10 MG/ML IJ SOLN
80.0000 mg | Freq: Two times a day (BID) | INTRAMUSCULAR | Status: DC
  Administered 2020-05-31 – 2020-06-02 (×4): 80 mg via INTRAVENOUS
  Filled 2020-05-31 (×5): qty 8

## 2020-05-31 MED ORDER — FAMOTIDINE 20 MG PO TABS
40.0000 mg | ORAL_TABLET | Freq: Every evening | ORAL | Status: DC
  Administered 2020-05-31: 18:00:00 40 mg via ORAL
  Filled 2020-05-31: qty 2

## 2020-05-31 MED ORDER — PANTOPRAZOLE SODIUM 40 MG PO TBEC: 40.0000 mg | DELAYED_RELEASE_TABLET | Freq: Two times a day (BID) | ORAL | Status: DC

## 2020-05-31 MED ORDER — FAMOTIDINE 20 MG PO TABS
20.0000 mg | ORAL_TABLET | Freq: Every evening | ORAL | Status: DC
  Administered 2020-06-01 – 2020-06-02 (×2): 20 mg via ORAL
  Filled 2020-05-31 (×2): qty 1

## 2020-05-31 MED ORDER — METOPROLOL SUCCINATE ER 25 MG PO TB24
12.5000 mg | ORAL_TABLET | Freq: Every day | ORAL | Status: DC
  Administered 2020-05-31 – 2020-06-03 (×4): 12.5 mg via ORAL
  Filled 2020-05-31 (×5): qty 1

## 2020-05-31 MED ORDER — FAMOTIDINE 20 MG PO TABS: 20.0000 mg | ORAL_TABLET | Freq: Every evening | ORAL | Status: DC

## 2020-05-31 MED ORDER — HEPARIN (PORCINE) 25000 UT/250ML-% IV SOLN
1500.0000 [IU]/h | INTRAVENOUS | Status: DC
  Administered 2020-05-31: 12:00:00 1000 [IU]/h via INTRAVENOUS
  Administered 2020-06-01 – 2020-06-02 (×2): 1400 [IU]/h via INTRAVENOUS
  Filled 2020-05-31 (×4): qty 250

## 2020-05-31 MED ORDER — GABAPENTIN 300 MG PO CAPS
300.0000 mg | ORAL_CAPSULE | Freq: Every day | ORAL | Status: DC
  Administered 2020-05-31 – 2020-06-02 (×3): 300 mg via ORAL
  Filled 2020-05-31 (×3): qty 1

## 2020-05-31 MED ORDER — HEPARIN BOLUS VIA INFUSION
4000.0000 [IU] | Freq: Once | INTRAVENOUS | Status: AC
  Administered 2020-05-31: 13:00:00 4000 [IU] via INTRAVENOUS
  Filled 2020-05-31: qty 4000

## 2020-05-31 MED ORDER — FUROSEMIDE 10 MG/ML IJ SOLN
80.0000 mg | Freq: Once | INTRAMUSCULAR | Status: AC
  Administered 2020-05-31: 05:00:00 80 mg via INTRAVENOUS
  Filled 2020-05-31: qty 8

## 2020-05-31 MED ORDER — PANTOPRAZOLE SODIUM 40 MG PO TBEC
40.0000 mg | DELAYED_RELEASE_TABLET | Freq: Two times a day (BID) | ORAL | Status: DC
  Administered 2020-05-31 – 2020-06-03 (×7): 40 mg via ORAL
  Filled 2020-05-31 (×7): qty 1

## 2020-05-31 MED ORDER — OXYCODONE HCL 5 MG PO TABS
5.0000 mg | ORAL_TABLET | Freq: Four times a day (QID) | ORAL | Status: DC | PRN
  Administered 2020-05-31 – 2020-06-02 (×3): 5 mg via ORAL
  Filled 2020-05-31 (×3): qty 1

## 2020-05-31 MED ORDER — ACETAMINOPHEN 325 MG PO TABS
650.0000 mg | ORAL_TABLET | Freq: Four times a day (QID) | ORAL | Status: DC | PRN
  Administered 2020-05-31 – 2020-06-01 (×2): 650 mg via ORAL
  Filled 2020-05-31 (×2): qty 2

## 2020-05-31 MED ORDER — ALBUTEROL SULFATE HFA 108 (90 BASE) MCG/ACT IN AERS: 2.0000 | INHALATION_SPRAY | Freq: Every day | RESPIRATORY_TRACT | Status: DC | PRN

## 2020-05-31 NOTE — Progress Notes (Signed)
ANTICOAGULATION CONSULT NOTE - Initial Consult  Pharmacy Consult for Heparin Indication: 12/22 BL LE DVT  Allergies  Allergen Reactions  . Lisinopril Cough    Pt reports significant cough  . Losartan Other (See Comments)    Pt reported cough and chest pain    Patient Measurements: Wt 72.2kg Ht 5'5" Heparin Dosing Weight: 72.2kg  Vital Signs: Temp: 98.2 F (36.8 C) (12/22 0837) Temp Source: Oral (12/22 0837) BP: 145/84 (12/22 0931) Pulse Rate: 93 (12/22 1045)  Labs: Recent Labs    05/30/20 2141  HGB 14.9  HCT 45.7  PLT 106*  CREATININE 2.64*    Estimated Creatinine Clearance: 20.4 mL/min (A) (by C-G formula based on SCr of 2.64 mg/dL (H)).   Medical History: Past Medical History:  Diagnosis Date  . AAA (abdominal aortic aneurysm) ([place]) 11/2011  . Abdominal aneurysm without mention of rupture 01/06/2012  . Abdominal tightness 01/11/2013  . Allergic rhinitis   . Arthritis   . DVT (deep venous thrombosis) ([place])   . Dysmetabolic syndrome   . Gastritis and gastroduodenitis   . Generalized osteoarthritis   . GERD (gastroesophageal reflux disease)   . GI bleed 04/13/2019  . GIB (gastrointestinal bleeding) 04/13/2019  . Gout   . Headache(784.0)   . HFrEF (heart failure with reduced ejection fraction) ([place]) 08/06/2019  . History of GI bleed 08/06/2019  . Hypercholesterolemia   . Hypertension   . Ischemic cardiomyopathy 08/06/2019  . Lower extremity edema   . Melena 04/13/2019  . Mixed hyperlipidemia 08/06/2019  . Nausea alone 07/13/2013  . 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 Peptic ulcer   . Shortness of breath   . Symptomatic anemia 04/13/2019  . UGI bleed    Assessment: 79 yo M with acute BLLE DVT 05/31/20. Pharmacy consulted to start heparin given renal function. Of note, patient with hx of GIB and PUD in the setting of using a lot of Goody Powder, requiring 4 units RBC. 02/28/19 EGD found multiple gastric and  duodenal ulcers. He was admitted to [facility] 9/21-10/1/20 and developed LLE DVT, for which he was discharged on apixaban. Unfortunately, he had another GIB 04/13/2019 while on apixaban and received an IVC filter. He is not on any AC PTA.  Currently with AKI on CKD. Hgb 14.9.   Goal of Therapy:  Heparin level 0.3-0.5 units/ml (lower goal for hx multiple GIB) Monitor platelets by anticoagulation protocol: Yes   Plan:  Heparin 4000 unit bolus x1 then 1000 units/hr  F/u 6hr HL Monitor daily HL, CBC/plt Monitor for signs/symptoms of bleeding  F/u long term anticoagulation plan   Benetta Spar, PharmD, BCPS, [place] Pharmacist  Please check AMION for all [place] phone numbers After 10:00 PM, call [place]

## 2020-05-31 NOTE — ED Notes (Signed)
Small, approx 2 inch skin tear to dorsal forearm noted upon arrival to room. Pt states that his dog scratched him yesterday. Non stick gauze applied and wound wrapped.

## 2020-05-31 NOTE — ED Notes (Signed)
Family at bedside. 

## 2020-05-31 NOTE — Progress Notes (Signed)
ANTICOAGULATION CONSULT NOTE - Initial Consult  Pharmacy Consult for Heparin Indication: 12/22 BL LE DVT  Allergies  Allergen Reactions  . Lisinopril Cough    Pt reports significant cough  . Losartan Other (See Comments)    Pt reported cough and chest pain    Patient Measurements: Wt 72.2kg Ht 5'5" Heparin Dosing Weight: 72.2kg  Vital Signs: Temp: 97.3 F (36.3 C) (12/22 1644) Temp Source: Oral (12/22 1644) BP: 109/77 (12/22 1644) Pulse Rate: 108 (12/22 1644)  Labs: Recent Labs    05/30/20 2141 05/31/20 1738  HGB 14.9  --   HCT 45.7  --   PLT 106*  --   HEPARINUNFRC  --  0.12*  CREATININE 2.64*  --     Estimated Creatinine Clearance: 20.4 mL/min (A) (by C-G formula based on SCr of 2.64 mg/dL (H)).   Medical History: Past Medical History:  Diagnosis Date  . AAA (abdominal aortic aneurysm) ([place]) 11/2011  . Abdominal aneurysm without mention of rupture 01/06/2012  . Abdominal tightness 01/11/2013  . Allergic rhinitis   . Arthritis   . DVT (deep venous thrombosis) ([place])   . Dysmetabolic syndrome   . Gastritis and gastroduodenitis   . Generalized osteoarthritis   . GERD (gastroesophageal reflux disease)   . GI bleed 04/13/2019  . GIB (gastrointestinal bleeding) 04/13/2019  . Gout   . Headache(784.0)   . HFrEF (heart failure with reduced ejection fraction) ([place]) 08/06/2019  . History of GI bleed 08/06/2019  . Hypercholesterolemia   . Hypertension   . Ischemic cardiomyopathy 08/06/2019  . Lower extremity edema   . Melena 04/13/2019  . Mixed hyperlipidemia 08/06/2019  . Nausea alone 07/13/2013  . 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 Peptic ulcer   . Shortness of breath   . Symptomatic anemia 04/13/2019  . UGI bleed    Assessment: 79 yo M with acute BLLE DVT 05/31/20. Pharmacy consulted to start heparin given renal function. Of note, patient with hx of GIB and PUD in the setting of using a lot of Goody Powder,  requiring 4 units RBC. 02/28/19 EGD found multiple gastric and duodenal ulcers. He was admitted to [facility] 9/21-10/1/20 and developed LLE DVT, for which he was discharged on apixaban. Unfortunately, he had another GIB 04/13/2019 while on apixaban and received an IVC filter. He is not on any AC PTA.  Currently with AKI on CKD. Hgb 14.9.   Heparin level this afternoon is subtherapeutic.   Goal of Therapy:  Heparin level 0.3-0.5 units/ml (lower goal for hx multiple GIB) Monitor platelets by anticoagulation protocol: Yes   Plan:  Increase Heparin to 1200 units/hr  F/u 8hr HL Monitor daily HL, CBC/plt Monitor for signs/symptoms of bleeding  F/u long term anticoagulation plan     Thank you for allowing [place] to participate in this patients care.   Jens Som, PharmD Please see amion for complete clinical pharmacist phone list per unit. 05/31/2020 6:55 PM

## 2020-05-31 NOTE — Progress Notes (Signed)
Bilateral lower extremity venous study completed.   Results given to RN.    Results given to Dr. Rosita Kea.   Please see CV Proc for preliminary results.   Vonzell Schlatter, RVT

## 2020-05-31 NOTE — Consult Note (Addendum)
Hospital Consult    Reason for Consult:  DVT, SFA occlusions bilaterally Requesting Physician:   MRN #:  [id number]  History of Present Illness: This is a 79 y.o. male who is known to [facility] from prior EVAR in June of 2013 by Dr. Trula Slade. He has extensive medical history significant for HF, hypertension, CAD, hyperlipidemia, carotid stenosis, PAD, and history of DVT with IVC filter. Vascular surgery has been asked to consult on him for his acute DVT and incidental finding of bilateral superficial femoral artery occlusion seen on venous Duplex. The patient explains that he presented to the ER with worsening lower extremity welling and pain over past couple days. He additionally complaining of shortness of breath. He explains that the swelling and pain in his legs has been present for approximately 3 weeks sort of intermittently getting worse. He explains that they are throbbing continuously and he has not been able to sleep. He has to hang his legs off the bed to help them feel better. He also endorses coldness in his feet. Prior to this recent swelling and pain he denies any pain in his legs or feet. He denies any claudication type symptoms unless he ambulates up an incline. He says he did not have any pain in his feet that was waking him up. He says he did not have any sores on his legs or feet either. He also says he does not normally have any swelling in his legs.  He has been compliant with taking Aspirin and Statin. He does continue to smoke  Past Medical History:  Diagnosis Date  . AAA (abdominal aortic aneurysm) ([place]) 11/2011  . Abdominal aneurysm without mention of rupture 01/06/2012  . Abdominal tightness 01/11/2013  . Allergic rhinitis   . Arthritis   . DVT (deep venous thrombosis) ([place])   . Dysmetabolic syndrome   . Gastritis and gastroduodenitis   . Generalized osteoarthritis   . GERD (gastroesophageal reflux disease)   . GI bleed 04/13/2019  . GIB (gastrointestinal bleeding) 04/13/2019   . Gout   . Headache(784.0)   . HFrEF (heart failure with reduced ejection fraction) ([place]) 08/06/2019  . History of GI bleed 08/06/2019  . Hypercholesterolemia   . Hypertension   . Ischemic cardiomyopathy 08/06/2019  . Lower extremity edema   . Melena 04/13/2019  . Mixed hyperlipidemia 08/06/2019  . Nausea alone 07/13/2013  . 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 Peptic ulcer   . Shortness of breath   . Symptomatic anemia 04/13/2019  . UGI bleed     Past Surgical History:  Procedure Laterality Date  . ABDOMINAL AORTIC ANEURYSM REPAIR  11/28/11   stent   . ABDOMINAL AORTIC ANEURYSM REPAIR    . APPENDECTOMY    . BIOPSY  04/15/2019   Procedure: BIOPSY;  Surgeon: Thornton Park, MD;  Location: [facility];  Service: Gastroenterology;;  . cataracts    . ESOPHAGOGASTRODUODENOSCOPY (EGD) WITH PROPOFOL N/A 04/15/2019   Procedure: ESOPHAGOGASTRODUODENOSCOPY (EGD) WITH PROPOFOL;  Surgeon: Thornton Park, MD;  Location: [facility];  Service: Gastroenterology;  Laterality: N/A;  . EYE SURGERY  2009   Left cataract  . 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 FOOT SURGERY    . IR IVC FILTER PLMT / S&I /IMG GUID/MOD SED  04/14/2019  . KNEE ARTHROSCOPY    . TONSILLECTOMY      Allergies  Allergen Reactions  . Lisinopril Cough    Pt reports significant cough  .  Losartan Other (See Comments)    Pt reported cough and chest pain    Prior to Admission medications   Medication Sig Start Date End Date Taking? Authorizing Provider  acetaminophen (TYLENOL) 325 MG tablet Take 650 mg by mouth every 6 (six) hours as needed for mild pain.    Yes [provider]  albuterol (VENTOLIN HFA) 108 (90 Base) MCG/ACT inhaler Inhale 2 puffs into the lungs daily as needed for wheezing or shortness of breath.  10/30/19  Yes [provider]  allopurinol (ZYLOPRIM) 300 MG tablet Take 300 mg by mouth daily. 06/24/12  Yes [provider]  atorvastatin (LIPITOR) 40 MG tablet Take 20 mg by mouth daily.   Yes [provider]  beta carotene w/minerals (OCUVITE) tablet Take 1 tablet by mouth daily.   Yes [provider]  famotidine (PEPCID) 40 MG tablet Take 40 mg by mouth every evening.  02/15/20  Yes [provider]  ferrous sulfate 324 MG TBEC Take 324 mg by mouth daily with breakfast.   Yes [provider]  furosemide (LASIX) 40 MG tablet Take 1 tablet (40 mg total) by mouth 2 (two) times daily. Patient taking differently: Take 20-40 mg by mouth See admin instructions. Take 1 tablet (40mg ) by mouth in the morning and 1/2 tablet (20mg ) by mouth in the evening. 11/12/19  Yes Tobb, Kardie, DO  gabapentin (NEURONTIN) 300 MG capsule Take 300 mg by mouth at bedtime as needed (pain). 07/19/19  Yes [provider]  HYDROcodone-acetaminophen (NORCO/VICODIN) 5-325 MG tablet Take 1 tablet by mouth every 6 (six) hours as needed for moderate pain.  04/20/20  Yes [provider]  isosorbide mononitrate (IMDUR) 60 MG 24 hr tablet TAKE 1 TABLET(60 MG) BY MOUTH DAILY Patient taking differently: Take 60 mg by mouth daily. 02/16/20  Yes Tobb, Kardie, DO  metoprolol succinate (TOPROL-XL) 25 MG 24 hr tablet Take 0.5 tablets (12.5 mg total) by mouth daily. 05/03/20  Yes Ezequiel Essex, MD  nitroGLYCERIN (NITROSTAT) 0.4 MG SL tablet Place 1 tablet (0.4 mg total) under the tongue every 5 (five) minutes as needed. Patient taking differently: Place 0.4 mg under the tongue every 5 (five) minutes as needed for chest pain. 08/20/19 04/30/20 Yes Tobb, Kardie, DO  pantoprazole (PROTONIX) 40 MG tablet Take 40 mg by mouth daily.   Yes [provider]  potassium chloride (KLOR-CON) 10 MEQ tablet Take 1 tablet (10 mEq total) by mouth daily. 05/02/20  Yes Ezequiel Essex, MD  sucralfate (CARAFATE) 1 g tablet Take 1 g by mouth 3 (three) times daily.   Yes [provider]  SYMBICORT 160-4.5  MCG/ACT inhaler Inhale 2 puffs into the lungs daily as needed (for asthma).  10/21/19  Yes [provider]    Social History   Socioeconomic History  . Marital status: Married    Spouse name: Not on file  . Number of children: Not on file  . Years of education: Not on file  . Highest education level: Not on file  Occupational History  . Not on file  Tobacco Use  . Smoking status: Current Every Day Smoker    Packs/day: 0.25    Years: 51.00    Pack years: 12.75    Types: Cigarettes  . Smokeless tobacco: Never Used  Vaping Use  . Vaping Use: Never used  Substance and Sexual Activity  . Alcohol use: No  . Drug use: No  . Sexual activity: Not on file  Other Topics Concern  .  Not on file  Social History Narrative  . Not on file   Social Determinants of Health   Financial Resource Strain: Not on file  Food Insecurity: No Food Insecurity  . Worried About Charity fundraiser in the Last Year: Never true  . Ran Out of Food in the Last Year: Never true  Transportation Needs: No Transportation Needs  . Lack of Transportation (Medical): No  . Lack of Transportation (Non-Medical): No  Physical Activity: Not on file  Stress: Not on file  Social Connections: Not on file  Intimate Partner Violence: Not on file     Family History  Problem Relation Age of Onset  . Cancer Mother   . Hypertension Mother   . Leukemia Mother   . Prostate cancer Father   . Cancer Sister   . Hypertension Sister   . Hypertension Brother   . Hypertension Daughter   . Heart attack Maternal Grandfather     ROS: Otherwise negative unless mentioned in HPI  Physical Examination  Vitals:   05/31/20 1300 05/31/20 1515  BP: (!) 133/99 (!) 120/97  Pulse: 88 89  Resp: (!) 25 (!) 21  Temp:  97.8 F (36.6 C)  SpO2: 96% 98%   There is no height or weight on file to calculate BMI.  General:  WDWN in NAD Gait: Not observed HENT: WNL, normocephalic Pulmonary: normal non-labored  breathing Cardiac: regular Abdomen: soft, NT/ND, no masses Vascular Exam/Pulses: 2+ femoral pulses bilaterally, no palpable popliteal or distal pulses. Doppler PT signals bilaterally, peroneal signal left leg. Feet bilaterally are cool. Left great toe with several small superficial eschars. Left great toe with cyanosis. Motor and sensation is intact Extremities: with ischemic changes to left greater than right foot, without Gangrene , without cellulitis; without open wounds; Bilateral mid to distal legs and dorsum of feet edematous 2+ pitting     Musculoskeletal: no muscle wasting or atrophy  Neurologic: A&O X 3;  No focal weakness or paresthesias are detected; speech is fluent/normal Psychiatric:  The pt has Normal affect. Lymph:  Unremarkable  CBC    Component Value Date/Time   WBC 14.3 (H) 05/30/2020 2141   RBC 5.01 05/30/2020 2141   HGB 14.9 05/30/2020 2141   HGB 15.3 04/26/2020 1055   HCT 45.7 05/30/2020 2141   HCT 44.9 04/26/2020 1055   PLT 106 (L) 05/30/2020 2141   PLT 140 (L) 04/26/2020 1055   MCV 91.2 05/30/2020 2141   MCV 90 04/26/2020 1055   MCH 29.7 05/30/2020 2141   MCHC 32.6 05/30/2020 2141   RDW 18.9 (H) 05/30/2020 2141   RDW 18.8 (H) 04/26/2020 1055   LYMPHSABS 1.3 04/30/2020 1222   MONOABS 0.8 04/30/2020 1222   EOSABS 0.0 04/30/2020 1222   BASOSABS 0.0 04/30/2020 1222    BMET    Component Value Date/Time   NA 130 (L) 05/30/2020 2141   NA 135 04/26/2020 1055   K 4.7 05/30/2020 2141   CL 98 05/30/2020 2141   CO2 16 (L) 05/30/2020 2141   GLUCOSE 137 (H) 05/30/2020 2141   BUN 94 (H) 05/30/2020 2141   BUN 57 (H) 04/26/2020 1055   CREATININE 2.64 (H) 05/30/2020 2141   CALCIUM 9.3 05/30/2020 2141   GFRNONAA 24 (L) 05/30/2020 2141   GFRAA 42 (L) 04/26/2020 1055    COAGS: Lab Results  Component Value Date   INR 1.1 04/30/2020   INR 1.3 (H) 04/13/2019   INR 1.06 11/28/2011     Non-Invasive Vascular Imaging:  VAS [place] Lower extremity Venous (DVT)  05/31/20: RIGHT:  - Findings consistent with acute deep vein thrombosis involving the right  peroneal veins.  - No cystic structure found in the popliteal fossa.    LEFT:  - Findings consistent with acute deep vein thrombosis involving the left  common femoral vein, SF junction, left posterior tibial veins, and left  peroneal veins.  - No cystic structure found in the popliteal fossa.   Incidental finding of bilateral occlusion of Superficial femoral arteries  05/01/2020 +-------+-----------+-----------+------------+------------+  ABI/TBIToday's ABIToday's TBIPrevious ABIPrevious TBI  +-------+-----------+-----------+------------+------------+  Right 0.69                        +-------+-----------+-----------+------------+------------+  Left  0.70                        +-------+-----------+-----------+------------+------------+     Statin:  Yes.   Beta Blocker:  Yes.   Aspirin:  No. ACEI:  No. ARB:  No. CCB use:  No Other antiplatelets/anticoagulants:  No.    ASSESSMENT/PLAN: This is a 79 y.o. male with multiple medical co morbidities who presented with acute DVT bilaterally. Left lower extremity with extensive DVT involving the common femoral vein, SFJ, posterior tibial veins and peroneal vein. Right lower extremity DVT in the tibial vessels. 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 He had ABI's on last admission showing moderate arterial disease with right ABI of 0.69 and Left 0.70. On exam he has no distal palpable pulses but Doppler PT bilaterally. His acute symptoms are likely secondary to his acute DVT, but he also appears to have some rest pain with some ischemic changes to his left greater than right lower extremity. He is currently on IV heparin for his DVT. Previously unable to tolerate Eliquis due to GI bleed. He has an IVC filter as well. We will follow to see if his symptoms  improve over next day or two with medical management. He will likely benefit from Arteriogram to further evaluate his arterial disease. He has AKI on CKD stage III with Scr of 2.64 up from baseline of about 1.7. For this reason would recommend medical optimization prior to undergoing arteriogram. The on call vascular surgeon, Dr. Donnetta Hutching, will evaluate the patient and provide further management plans   [facility] [phone] 05/31/2020  4:43 PM   I have examined the patient, reviewed and agree with above.  Very complex patient with multiple medical issues.  Presented with leg swelling and discomfort related to heart failure and DVT.  Had incidental finding of superficial femoral artery occlusions.  Difficult to determine by his history exactly what his pain is related to.  Had noninvasive studies with a recent hospitalization on May 01, 2020.  At that time he was found to have monophasic waveform bilaterally with ankle arm index of 0.7 bilaterally.  Certainly does not have any new onset of critical limb ischemia.  Would be extremely high risk for any sort of operative treatment.  We will follow as he continues with the treatment of his acute DVT and diuresis for his congestive heart failure.  Hopefully can avoid any arteriography if possible.  Does have baseline renal insufficiency.  We will follow with you. Curt Jews, MD 05/31/2020 7:00 PM

## 2020-05-31 NOTE — Telephone Encounter (Signed)
Called the "best number" that was provided but no answer and no mailbox setup.

## 2020-05-31 NOTE — Telephone Encounter (Signed)
Please call the patient son-in law ([name]) to understand the details.

## 2020-05-31 NOTE — ED Provider Notes (Signed)
[facility] EMERGENCY DEPARTMENT Provider Note   CSN: [id number] Arrival date & time: 05/30/20  2123     History Chief Complaint  Patient presents with  . Foot Swelling    Richard Townsend is a 79 y.o. male.  Patient presents to the emergency department for evaluation of shortness of breath and swelling of his legs.  Patient reports that he has been experiencing increased swelling of his legs for more than 3 weeks.  He does have a history of CHF.  Patient reports that his legs are more swollen now than they have ever been.  He does report that recently his Lasix was reduced from 80 mg to 60 mg twice a day, he is not sure why.  Patient not experiencing any chest pain.        Past Medical History:  Diagnosis Date  . AAA (abdominal aortic aneurysm) ([place]) 11/2011  . Abdominal aneurysm without mention of rupture 01/06/2012  . Abdominal tightness 01/11/2013  . Allergic rhinitis   . Arthritis   . DVT (deep venous thrombosis) ([place])   . Dysmetabolic syndrome   . Gastritis and gastroduodenitis   . Generalized osteoarthritis   . GERD (gastroesophageal reflux disease)   . GI bleed 04/13/2019  . GIB (gastrointestinal bleeding) 04/13/2019  . Gout   . Headache(784.0)   . HFrEF (heart failure with reduced ejection fraction) ([place]) 08/06/2019  . History of GI bleed 08/06/2019  . Hypercholesterolemia   . Hypertension   . Ischemic cardiomyopathy 08/06/2019  . Lower extremity edema   . Melena 04/13/2019  . Mixed hyperlipidemia 08/06/2019  . Nausea alone 07/13/2013  . 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 Peptic ulcer   . Shortness of breath   . Symptomatic anemia 04/13/2019  . UGI bleed     Patient Active Problem List   Diagnosis Date Noted  . Aortic valve stenosis 05/23/2020  . Allergic rhinitis   . Arthritis   . Dysmetabolic syndrome   . Generalized osteoarthritis   . GERD (gastroesophageal reflux disease)   . Gout    . Hypercholesterolemia   . Hypertension   . Lower extremity edema   . Peptic ulcer   . UGI bleed   . CHF (congestive heart failure) ([place]) 04/30/2020  . Pressure injury of skin 04/30/2020  . Essential hypertension 11/13/2019  . Mild CAD 11/13/2019  . Shortness of breath 09/28/2019  . Ischemic cardiomyopathy 08/06/2019  . HFrEF (heart failure with reduced ejection fraction) ([place]) 08/06/2019  . Mixed hyperlipidemia 08/06/2019  . History of GI bleed 08/06/2019  . Gastritis and gastroduodenitis   . Melena 04/13/2019  . GIB (gastrointestinal bleeding) 04/13/2019  . Symptomatic anemia 04/13/2019  . DVT (deep venous thrombosis) ([place]) 04/13/2019  . GI bleed 04/13/2019  . Nausea alone 07/13/2013  . AAA (abdominal aortic aneurysm) ([place]) 01/11/2013  . 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 Abdominal tightness 01/11/2013  . Abdominal aneurysm without mention of rupture 01/06/2012    Past Surgical History:  Procedure Laterality Date  . ABDOMINAL AORTIC ANEURYSM REPAIR  11/28/11   stent   . ABDOMINAL AORTIC ANEURYSM REPAIR    . APPENDECTOMY    . BIOPSY  04/15/2019   Procedure: BIOPSY;  Surgeon: Thornton Park, MD;  Location: [facility] END[facility]COPY;  Service: Gastroenterology;;  . cataracts    . ESOPHAGOGASTRODUODENOSCOPY (EGD) WITH PROPOFOL N/A 04/15/2019   Procedure: ESOPHAGOGASTRODUODENOSCOPY (EGD) WITH PROPOFOL;  Surgeon:  Thornton Park, MD;  Location: [facility];  Service: Gastroenterology;  Laterality: N/A;  . EYE SURGERY  2009   Left cataract  . 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 FOOT SURGERY    . IR IVC FILTER PLMT / S&I /IMG GUID/MOD SED  04/14/2019  . KNEE ARTHROSCOPY    . TONSILLECTOMY         Family History  Problem Relation Age of Onset  . Cancer Mother   . Hypertension Mother   . Leukemia Mother   . Prostate cancer Father   . Cancer Sister   . Hypertension Sister   . Hypertension Brother   .  Hypertension Daughter   . Heart attack Maternal Grandfather     Social History   Tobacco Use  . Smoking status: Current Every Day Smoker    Packs/day: 0.25    Years: 51.00    Pack years: 12.75    Types: Cigarettes  . Smokeless tobacco: Never Used  Vaping Use  . Vaping Use: Never used  Substance Use Topics  . Alcohol use: No  . Drug use: No    Home Medications Prior to Admission medications   Medication Sig Start Date End Date Taking? Authorizing Provider  acetaminophen (TYLENOL) 325 MG tablet Take 650 mg by mouth every 6 (six) hours as needed for mild pain.     [provider]  albuterol (VENTOLIN HFA) 108 (90 Base) MCG/ACT inhaler Inhale 2 puffs into the lungs daily as needed for wheezing or shortness of breath.  10/30/19   [provider]  allopurinol (ZYLOPRIM) 300 MG tablet Take 300 mg by mouth daily. 06/24/12   [provider]  atorvastatin (LIPITOR) 20 MG tablet Take 20 mg by mouth daily.     [provider]  beta carotene w/minerals (OCUVITE) tablet Take 1 tablet by mouth daily.    [provider]  famotidine (PEPCID) 40 MG tablet Take 40 mg by mouth every evening.  02/15/20   [provider]  ferrous sulfate 324 MG TBEC Take 324 mg by mouth daily with breakfast.    [provider]  furosemide (LASIX) 40 MG tablet Take 1 tablet (40 mg total) by mouth 2 (two) times daily. 11/12/19   Tobb, Kardie, DO  gabapentin (NEURONTIN) 300 MG capsule Take 300 mg by mouth at bedtime.  07/19/19   [provider]  HYDROcodone-acetaminophen (NORCO/VICODIN) 5-325 MG tablet Take 1 tablet by mouth every 6 (six) hours as needed for moderate pain.  04/20/20   [provider]  isosorbide mononitrate (IMDUR) 60 MG 24 hr tablet TAKE 1 TABLET(60 MG) BY MOUTH DAILY Patient taking differently: Take 60 mg by mouth daily. 02/16/20   Tobb, Kardie, DO  metoprolol succinate (TOPROL-XL) 25 MG 24 hr tablet Take 0.5 tablets (12.5 mg total)  by mouth daily. 05/03/20   Ezequiel Essex, MD  nitroGLYCERIN (NITROSTAT) 0.4 MG SL tablet Place 1 tablet (0.4 mg total) under the tongue every 5 (five) minutes as needed. Patient taking differently: Place 0.4 mg under the tongue every 5 (five) minutes as needed for chest pain.  08/20/19 04/30/20  Tobb, Kardie, DO  pantoprazole (PROTONIX) 40 MG tablet Take 40 mg by mouth daily.    [provider]  potassium chloride (KLOR-CON) 10 MEQ tablet Take 1 tablet (10 mEq total) by mouth daily. 05/02/20   Ezequiel Essex, MD  sucralfate (CARAFATE) 1 g tablet Take 1 g by mouth 4 (four) times daily.    [provider]  SYMBICORT 160-4.5 MCG/ACT inhaler Inhale 2 puffs into the lungs daily as needed (for asthma).  10/21/19   [provider]    Allergies    Lisinopril and Losartan  Review of Systems   Review of Systems  Respiratory: Positive for shortness of breath.   Cardiovascular: Positive for leg swelling.  All other systems reviewed and are negative.   Physical Exam Updated Vital Signs BP (!) 135/94   Pulse 89   Temp 97.7 F (36.5 C) (Oral)   Resp (!) 21   SpO2 98%   Physical Exam Vitals and nursing note reviewed.  Constitutional:      General: He is not in acute distress.    Appearance: Normal appearance. He is well-developed and well-nourished.  HENT:     Head: Normocephalic and atraumatic.     Right Ear: Hearing normal.     Left Ear: Hearing normal.     Nose: Nose normal.     Mouth/Throat:     Mouth: Oropharynx is clear and moist and mucous membranes are normal.  Eyes:     Extraocular Movements: EOM normal.     Conjunctiva/sclera: Conjunctivae normal.     Pupils: Pupils are equal, round, and reactive to light.  Cardiovascular:     Rate and Rhythm: Regular rhythm.     Heart sounds: S1 normal and S2 normal. No murmur heard. No friction rub. No gallop.   Pulmonary:     Effort: Pulmonary effort is normal. No respiratory distress.     Breath sounds:  Normal breath sounds.  Chest:     Chest wall: No tenderness.  Abdominal:     General: Bowel sounds are normal.     Palpations: Abdomen is soft. There is no hepatosplenomegaly.     Tenderness: There is no abdominal tenderness. There is no guarding or rebound. Negative signs include Murphy's sign and McBurney's sign.     Hernia: No hernia is present.  Musculoskeletal:        General: Normal range of motion.     Cervical back: Normal range of motion and neck supple.     Right lower leg: 4+ Edema present.     Left lower leg: 4+ Edema present.  Skin:    General: Skin is warm, dry and intact.     Findings: No rash.     Nails: There is no cyanosis.  Neurological:     Mental Status: He is alert and oriented to person, place, and time.     GCS: GCS eye subscore is 4. GCS verbal subscore is 5. GCS motor subscore is 6.     Cranial Nerves: No cranial nerve deficit.     Sensory: No sensory deficit.     Coordination: Coordination normal.     Deep Tendon Reflexes: Strength normal.  Psychiatric:        Mood and Affect: Mood and affect normal.        Speech: Speech normal.        Behavior: Behavior normal.        Thought Content: Thought content normal.     ED Results / Procedures / Treatments   Labs (all labs ordered are listed, but only abnormal results are displayed) Labs Reviewed  BASIC METABOLIC PANEL - Abnormal; Notable for the following components:      Result Value   Sodium 130 (*)    CO2 16 (*)    Glucose, Bld 137 (*)    BUN 94 (*)  Creatinine, Ser 2.64 (*)    GFR, Estimated 24 (*)    Anion gap 16 (*)    All other components within normal limits  CBC - Abnormal; Notable for the following components:   WBC 14.3 (*)    RDW 18.9 (*)    Platelets 106 (*)    nRBC 1.1 (*)    All other components within normal limits  BRAIN NATRIURETIC PEPTIDE - Abnormal; Notable for the following components:   B Natriuretic Peptide >4,500.0 (*)    All other components within normal limits   RESP PANEL BY RT-PCR (FLU A&B, COVID) [id number]    EKG None  Radiology DG Chest Port 1 View  Result Date: 05/31/2020 CLINICAL DATA:  Shortness of breath and edema EXAM: PORTABLE CHEST 1 VIEW COMPARISON:  Radiograph 04/30/2020 FINDINGS: Cardiomegaly is similar to comparison exam. Calcified, tortuous aorta. Pulmonary vascular congestion and cephalization. Some hazy interstitial opacity is present in the mid to lower lungs with some fissural and septal thickening as well. No focal consolidative opacity. No pneumothorax. No visible effusion though the costophrenic sulci are partially collimated. No acute osseous or soft tissue abnormality. Degenerative changes are present in the imaged spine and shoulders including more severe degenerative change and bony remodeling of the left humeral head and glenoid. IMPRESSION: 1. Features compatible with CHF/volume overload including cardiomegaly, pulmonary vascular congestion and interstitial edema. 2.  Aortic Atherosclerosis (ICD10-I70.0). Electronically Signed   By: Lovena Le M.D.   On: 05/31/2020 04:52    Procedures Procedures (including critical care time)  Medications Ordered in ED Medications  furosemide (LASIX) injection 80 mg (80 mg Intravenous Given 05/31/20 0514)    ED Course  I have reviewed the triage vital signs and the nursing notes.  Pertinent labs & imaging results that were available during my care of the patient were reviewed by me and considered in my medical decision making (see chart for details).    MDM Rules/Calculators/A&P                          Patient with history of systolic heart failure, ejection fraction 30 to 35% seen on echo last month presents to the emergency department with complaints of shortness of breath and increasing leg swelling.  Patient has significant bilateral lower extremity pitting edema with rales on exam.  Chest x-ray compatible with congestive heart failure exacerbation.  Not requiring oxygen at  this time.  Patient taking 60 mg of Lasix twice a day, recently was cut down, presumably because of worsening renal function.  Creatinine worse today at 2.6, but will require aggressive diuresis.  Patient given 80 mg of Lasix IV in the emergency department and will be hospitalized for further diuresis.  Final Clinical Impression(s) / ED Diagnoses Final diagnoses:  Acute on chronic congestive heart failure, unspecified heart failure type [facility])    Rx / DC Orders ED Discharge Orders    None       Orpah Greek, MD 05/31/20 [phone]

## 2020-05-31 NOTE — ED Notes (Signed)
Patient is resting comfortably. 

## 2020-05-31 NOTE — H&P (Addendum)
[facility] Admission History and Physical Service Pager: [phone]  Patient name: Richard Townsend Medical record number: [id number] Date of birth: June 25, 1940 Age: 79 y.o. Gender: male  Primary Care Provider: Ernestene Kiel, MD Consultants: None Code Status: DNR/DNI  Preferred Emergency Contact: Vicenta Aly (daughter) [phone] or son-in-law Lorrene Reid, [phone]  Chief Complaint: Lower extremity edema  Assessment and Plan: Richard Townsend is a 79 y.o. male presenting with worsening lower extremity edema. 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   Acute on Chronic HFrEF (EF 30-35%) Patient presents with 3 days of worsening lower extremity edema with associated chest tightness and shortness of breath. ED labs significant for BNP > 4,500. WBC increased to 14.3, hyponatremic to 130, bicarb 16. CXR with cardiomegaly, pulmonary congestion and interstitial edema, consistent with CHF exacerbation. Examination significant for 3+ pitting edema b/l feet to distal leg, with 1+ edema from mid leg to tibial tuberosities. Received 80 mg IV Lasix x1 in ED. Patient was recently admitted for similar presentation last month. Echo at that time significant for valvular disease and EF 30-35%. Patient was d/c on 40 mg Lasix BID with 10 mEq potassium supplementation daily.  States that he was seen by his cardiologist last week and advised to cut back to 60 mg Lasix BID. Patient has recorded allergies to Lisinopril and Losartan. Per recent cardiology note, patient also has a history of significant hypotension on Entresto and has been recommended to start on low-dose Aldactone. Patient referred to EP for potential defibrillator placement. Appears that discussions about palliative medicine has also been discussed. Due to similar presentation as before, suspect acute on chronic HFrEF. Other  differential includes DVT/PE. Patient with edematous and cold extremities on exam, unable to appreciate pulses (see below). Wells score 4.5, moderate risk. Patient not on anticoagulation but s/p IVC filter. Breathing well on room air, not tachycardic so believe this is less likely.  - Admit to observation med-tele, attending Dr. Andria Frames  - Consider Palliative consult - Cardiology consult, appreciate recommendations - Continuous cardiac monitoring, pulse ox - IV Lasix 80 mg BID - Monitor electrolytes with daily BMP, keep K>4, Mg>2 - Continue home Imdur 60 mg daily  - Continue Metoprolol 12.5 mg daily  - Consider V/Q scan  - Strict I/O, daily weights - Up with assistance - PT/OT eval and treat  AKI on CKD Stage 3b Admission creatinine of 2.64, baseline creatinine unclear but seems to be around 1.7-1.8. Patient with decreased PO intake, intermittent vomiting and dry MM on examination. Suspect pre-renal causes. As noted above, was recently decreased to 60 mg Lasix BID from 80 mg BID.  - Avoid nephrotoxic drugs as able - Monitor with daily BMP  Thrombocytopenia: chronic Platelets 106 on admission. Per chart reviews, patient has been thrombocytopenic since last admission with platelets around 115-140. Last year, was 330-480.  - Monitor with daily CBC - Consider outpatient work-up   CAD  Ischemic Cardiomyopathy  Hx MI  Hx AAA s/p repair  DVT s/p IVC On Atorvastatin 30 mg daily. [facility] at [facility] 03/24/2019 demonstrated LM mild diffuse CAD, distal 30% stenosis; LAD mild diffuse CAD, proximal 50% stenosis, 30% distal stenosis; LCx co-dominant, mild diffuse CAD, distal collaterals to RPLV system; RCA co-dominant, severe diffuse CAD, ostial 100% CTO. Previously on Eliquis but taken off after large GI bleed.  IVC placed 04/2019. Unable to appreciate distal lower extremity pulses on  examination. B/l feet cold and hyperalgesic. Appears that patient had vascular consult at previous admission,  was recommended aortogram with runoff but patient declined.  -Continue atorvastatin - Arterial and venous lower extremity U/S  Hx GI bleed  GERD  Hx Stomach Ulcer Home medications notable for Famotidine 40 mg, carafate 1 mg, ferrous sulfate 325 mg, Protonix 40 mg BID.  - Continue home Pepcid, iron and protonix   COPD: chronic, stable Home medication includes albuterol PRN.  - Albuterol PRN  Hyperlipidemia: chronic, stable On Atorvastatin 20 mg daily at home. Unsure of last lipid panel.  - Continue home atorvastatin - Consider lipid panel  Gout: chronic, stable - Continue home allopurinol 300 mg daily   Tobacco Abuse:  Current smoker, has cut down significantly. Previously smoked 3-4 ppd, now only 3 cigarettes/day. Has smoked since he was a teenager.  - Nicotine patch PRN - Encourage smoking cessation  Acute Grief Reaction Patient notes recent passing of his son and his wife. Is intermittently tearful on examination when speaking about this. Lives alone, admits to being lonely. Patient is DNR/DNI and states "I want to be with my wife". Not on any medications for depression. Due to his chronic medical conditions, discussion about palliative care has been brought up in the past.  - Monitor mood - Consider palliative consult - Consider SSRI outpatient   FEN/GI: Heart healthy Prophylaxis: SCD's  Disposition: Med-tele  History of Present Illness:  Richard Townsend is a 79 y.o. male presenting with  swollen legs for about 3-4 days.  Patient also notes pain in legs. Denies any SOB and chest pain but feels tired.  Was feeling fine until 2-3 days ago.  Has been compliant with medications and low sodium diet.  Previously taking 80 mg of Lasix twice a day.  Recently decreased to 60 mg twice a day per cardiology recommendations. Patient is followed by Dr Harriet Masson, heart doc in [place].  Patient has had a cough with white sputum production but notes this is normal for him. Denies fevers. Denies  any increase in weight, actually feels that he has been losing weight.   Unable to lie on his back due to SOB and also previous injury to his back. Endorses occasional heart palpitations but not currently and not recently.   Patient lives alone with a dog. Admits to two recent falls, but landed on the couch both times.  No LOC or injury from this. States that his feet tangled up his dog.   Has a Home Health nurse that checks on him weekly.   COVID vaccines x2.   ED: Obviously fluid overloaded with crackles on exam, b/l lower extremity edema.  Received 80 mg IV Lasix x1. VSS.  Review Of Systems: Per HPI with the following additions:   Review of Systems  Constitutional: Positive for appetite change and fatigue. Negative for fever.  Respiratory: Positive for cough, chest tightness, shortness of breath and wheezing.   Cardiovascular: Positive for leg swelling. Negative for chest pain and palpitations.  Gastrointestinal: Positive for nausea. Negative for abdominal distention, abdominal pain and blood in stool.  Genitourinary: Negative for decreased urine volume, difficulty urinating, frequency and hematuria.  Musculoskeletal: Positive for back pain.  Neurological: Positive for weakness. Negative for dizziness, speech difficulty and headaches.  Psychiatric/Behavioral: Negative for confusion.     Patient Active Problem List   Diagnosis Date Noted  . Aortic valve stenosis 05/23/2020  . Allergic rhinitis   . Arthritis   . Dysmetabolic syndrome   .  Generalized osteoarthritis   . GERD (gastroesophageal reflux disease)   . Gout   . Hypercholesterolemia   . Hypertension   . Lower extremity edema   . Peptic ulcer   . UGI bleed   . CHF (congestive heart failure) ([place]) 04/30/2020  . Pressure injury of skin 04/30/2020  . Essential hypertension 11/13/2019  . Mild CAD 11/13/2019  . Shortness of breath 09/28/2019  . Ischemic cardiomyopathy 08/06/2019  . HFrEF (heart failure with reduced  ejection fraction) ([place]) 08/06/2019  . Mixed hyperlipidemia 08/06/2019  . History of GI bleed 08/06/2019  . Gastritis and gastroduodenitis   . Melena 04/13/2019  . GIB (gastrointestinal bleeding) 04/13/2019  . Symptomatic anemia 04/13/2019  . DVT (deep venous thrombosis) ([place]) 04/13/2019  . GI bleed 04/13/2019  . Nausea alone 07/13/2013  . AAA (abdominal aortic aneurysm) ([place]) 01/11/2013  . 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 Abdominal tightness 01/11/2013  . Abdominal aneurysm without mention of rupture 01/06/2012    Past Medical History: Past Medical History:  Diagnosis Date  . AAA (abdominal aortic aneurysm) ([place]) 11/2011  . Abdominal aneurysm without mention of rupture 01/06/2012  . Abdominal tightness 01/11/2013  . Allergic rhinitis   . Arthritis   . DVT (deep venous thrombosis) ([place])   . Dysmetabolic syndrome   . Gastritis and gastroduodenitis   . Generalized osteoarthritis   . GERD (gastroesophageal reflux disease)   . GI bleed 04/13/2019  . GIB (gastrointestinal bleeding) 04/13/2019  . Gout   . Headache(784.0)   . HFrEF (heart failure with reduced ejection fraction) ([place]) 08/06/2019  . History of GI bleed 08/06/2019  . Hypercholesterolemia   . Hypertension   . Ischemic cardiomyopathy 08/06/2019  . Lower extremity edema   . Melena 04/13/2019  . Mixed hyperlipidemia 08/06/2019  . Nausea alone 07/13/2013  . 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 Peptic ulcer   . Shortness of breath   . Symptomatic anemia 04/13/2019  . UGI bleed     Past Surgical History: Past Surgical History:  Procedure Laterality Date  . ABDOMINAL AORTIC ANEURYSM REPAIR  11/28/11   stent   . ABDOMINAL AORTIC ANEURYSM REPAIR    . APPENDECTOMY    . BIOPSY  04/15/2019   Procedure: BIOPSY;  Surgeon: Thornton Park, MD;  Location: [facility];  Service: Gastroenterology;;  . cataracts    .  ESOPHAGOGASTRODUODENOSCOPY (EGD) WITH PROPOFOL N/A 04/15/2019   Procedure: ESOPHAGOGASTRODUODENOSCOPY (EGD) WITH PROPOFOL;  Surgeon: Thornton Park, MD;  Location: [facility];  Service: Gastroenterology;  Laterality: N/A;  . EYE SURGERY  2009   Left cataract  . 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 FOOT SURGERY    . IR IVC FILTER PLMT / S&I /IMG GUID/MOD SED  04/14/2019  . KNEE ARTHROSCOPY    . TONSILLECTOMY      Social History: Social History   Tobacco Use  . Smoking status: Current Every Day Smoker    Packs/day: 0.25    Years: 51.00    Pack years: 12.75    Types: Cigarettes  . Smokeless tobacco: Never Used  Vaping Use  . Vaping Use: Never used  Substance Use Topics  . Alcohol use: No  . Drug use: No    Please also refer to relevant sections of EMR.  Family History: Family History  Problem Relation Age of Onset  . Cancer Mother   . Hypertension Mother   .  Leukemia Mother   . Prostate cancer Father   . Cancer Sister   . Hypertension Sister   . Hypertension Brother   . Hypertension Daughter   . Heart attack Maternal Grandfather     Allergies and Medications: Allergies  Allergen Reactions  . Lisinopril Cough    Pt reports significant cough  . Losartan Other (See Comments)    Pt reported cough and chest pain   No current facility-administered medications on file prior to encounter.   Current Outpatient Medications on File Prior to Encounter  Medication Sig Dispense Refill  . acetaminophen (TYLENOL) 325 MG tablet Take 650 mg by mouth every 6 (six) hours as needed for mild pain.     Marland Kitchen albuterol (VENTOLIN HFA) 108 (90 Base) MCG/ACT inhaler Inhale 2 puffs into the lungs daily as needed for wheezing or shortness of breath.     . allopurinol (ZYLOPRIM) 300 MG tablet Take 300 mg by mouth daily.    Marland Kitchen atorvastatin (LIPITOR) 20 MG tablet Take 20 mg by mouth daily.     . beta carotene w/minerals (OCUVITE) tablet Take 1 tablet by mouth daily.    . famotidine  (PEPCID) 40 MG tablet Take 40 mg by mouth every evening.     . ferrous sulfate 324 MG TBEC Take 324 mg by mouth daily with breakfast.    . furosemide (LASIX) 40 MG tablet Take 1 tablet (40 mg total) by mouth 2 (two) times daily. 180 tablet 3  . gabapentin (NEURONTIN) 300 MG capsule Take 300 mg by mouth at bedtime.     Marland Kitchen HYDROcodone-acetaminophen (NORCO/VICODIN) 5-325 MG tablet Take 1 tablet by mouth every 6 (six) hours as needed for moderate pain.     . isosorbide mononitrate (IMDUR) 60 MG 24 hr tablet TAKE 1 TABLET(60 MG) BY MOUTH DAILY (Patient taking differently: Take 60 mg by mouth daily.) 90 tablet 2  . metoprolol succinate (TOPROL-XL) 25 MG 24 hr tablet Take 0.5 tablets (12.5 mg total) by mouth daily. 15 tablet 0  . nitroGLYCERIN (NITROSTAT) 0.4 MG SL tablet Place 1 tablet (0.4 mg total) under the tongue every 5 (five) minutes as needed. (Patient taking differently: Place 0.4 mg under the tongue every 5 (five) minutes as needed for chest pain. ) 30 tablet 3  . pantoprazole (PROTONIX) 40 MG tablet Take 40 mg by mouth daily.    . potassium chloride (KLOR-CON) 10 MEQ tablet Take 1 tablet (10 mEq total) by mouth daily. 30 tablet 0  . sucralfate (CARAFATE) 1 g tablet Take 1 g by mouth 4 (four) times daily.    . SYMBICORT 160-4.5 MCG/ACT inhaler Inhale 2 puffs into the lungs daily as needed (for asthma).       Objective: BP (!) 135/94   Pulse 89   Temp 97.7 F (36.5 C) (Oral)   Resp (!) 21   SpO2 98%  Exam: General: Elderly, pleasant, in no distress Eyes: EOMI, no scleral icterus ENTM: Dry mucous membranes, no rhinorrhea Neck: Supple, JVD noted  Cardiovascular: RRR, 2+ radial pulses, unable to palpate distal lower extremity pulses, 3+ pitting edema b/l feet, poor vasculature distal extremities  Respiratory: Rhonchi appreciated in b/l bases, fine crackles in left lower base, saturating >92% on room air, no respiratory distress Gastrointestinal: mild tenderness to palpation RLQ and  suprapubic regions. No R/G, soft, non-distended MSK: moving all extremities spontaneously  Derm: distal extremities cold to touch, area of black eschar lateral left great toe. Chronic appearing blisters on b/l feet, onychomycosis of fingers  and toes  Neuro: No focal deficits, speaking clearly, answering questions appropriately  Psych: Pleasant, intermittently tearful, mood appropriate          Labs and Imaging: CBC BMET  Recent Labs  Lab 05/30/20 2141  WBC 14.3*  HGB 14.9  HCT 45.7  PLT 106*   Recent Labs  Lab 05/30/20 2141  NA 130*  K 4.7  CL 98  CO2 16*  BUN 94*  CREATININE 2.64*  GLUCOSE 137*  CALCIUM 9.3     EKG: Sinus rhythm 85 bpm, left atrial enlargement, prolonged PR interval   DG Chest Port 1 View  Result Date: 05/31/2020 CLINICAL DATA:  Shortness of breath and edema EXAM: PORTABLE CHEST 1 VIEW COMPARISON:  Radiograph 04/30/2020 FINDINGS: Cardiomegaly is similar to comparison exam. Calcified, tortuous aorta. Pulmonary vascular congestion and cephalization. Some hazy interstitial opacity is present in the mid to lower lungs with some fissural and septal thickening as well. No focal consolidative opacity. No pneumothorax. No visible effusion though the costophrenic sulci are partially collimated. No acute osseous or soft tissue abnormality. Degenerative changes are present in the imaged spine and shoulders including more severe degenerative change and bony remodeling of the left humeral head and glenoid. IMPRESSION: 1. Features compatible with CHF/volume overload including cardiomegaly, pulmonary vascular congestion and interstitial edema. 2.  Aortic Atherosclerosis (ICD10-I70.0). Electronically Signed   By: Lovena Le M.D.   On: 05/31/2020 04:52    Sharion Settler, DO 05/31/2020, 5:17 AM PGY-1, [place] Intern pager: [phone], text pages welcome  Carollee Leitz, MD PGY-2 Family Medicine Residency

## 2020-05-31 NOTE — ED Notes (Addendum)
Pt ate a few bites from tray. Repositioned for comfort.

## 2020-05-31 NOTE — ED Notes (Addendum)
Patient is resting comfortably. Son in law has been at bedside all day and is going home now. Warm blankets given and pt repositioned for comfort

## 2020-05-31 NOTE — ED Notes (Signed)
Lunch Tray Ordered @ 1102. 

## 2020-06-01 ENCOUNTER — Telehealth: Payer: Self-pay | Admitting: Cardiovascular Disease

## 2020-06-01 ENCOUNTER — Other Ambulatory Visit: Payer: Self-pay

## 2020-06-01 ENCOUNTER — Inpatient Hospital Stay (HOSPITAL_COMMUNITY): Payer: Medicare HMO

## 2020-06-01 DIAGNOSIS — I82411 Acute embolism and thrombosis of right femoral vein: Secondary | ICD-10-CM | POA: Diagnosis present

## 2020-06-01 DIAGNOSIS — Z95828 Presence of other vascular implants and grafts: Secondary | ICD-10-CM | POA: Diagnosis not present

## 2020-06-01 DIAGNOSIS — I70263 Atherosclerosis of native arteries of extremities with gangrene, bilateral legs: Secondary | ICD-10-CM | POA: Diagnosis present

## 2020-06-01 DIAGNOSIS — I509 Heart failure, unspecified: Secondary | ICD-10-CM | POA: Diagnosis not present

## 2020-06-01 DIAGNOSIS — I252 Old myocardial infarction: Secondary | ICD-10-CM | POA: Diagnosis not present

## 2020-06-01 DIAGNOSIS — Z888 Allergy status to other drugs, medicaments and biological substances status: Secondary | ICD-10-CM | POA: Diagnosis not present

## 2020-06-01 DIAGNOSIS — I5041 Acute combined systolic (congestive) and diastolic (congestive) heart failure: Secondary | ICD-10-CM | POA: Diagnosis not present

## 2020-06-01 DIAGNOSIS — Z66 Do not resuscitate: Secondary | ICD-10-CM | POA: Diagnosis not present

## 2020-06-01 DIAGNOSIS — I2510 Atherosclerotic heart disease of native coronary artery without angina pectoris: Secondary | ICD-10-CM | POA: Diagnosis not present

## 2020-06-01 DIAGNOSIS — Z515 Encounter for palliative care: Secondary | ICD-10-CM | POA: Diagnosis not present

## 2020-06-01 DIAGNOSIS — Z806 Family history of leukemia: Secondary | ICD-10-CM | POA: Diagnosis not present

## 2020-06-01 DIAGNOSIS — I255 Ischemic cardiomyopathy: Secondary | ICD-10-CM | POA: Diagnosis present

## 2020-06-01 DIAGNOSIS — M7989 Other specified soft tissue disorders: Secondary | ICD-10-CM | POA: Diagnosis present

## 2020-06-01 DIAGNOSIS — E871 Hypo-osmolality and hyponatremia: Secondary | ICD-10-CM | POA: Diagnosis present

## 2020-06-01 DIAGNOSIS — I739 Peripheral vascular disease, unspecified: Secondary | ICD-10-CM | POA: Diagnosis not present

## 2020-06-01 DIAGNOSIS — I130 Hypertensive heart and chronic kidney disease with heart failure and stage 1 through stage 4 chronic kidney disease, or unspecified chronic kidney disease: Secondary | ICD-10-CM | POA: Diagnosis not present

## 2020-06-01 DIAGNOSIS — E782 Mixed hyperlipidemia: Secondary | ICD-10-CM | POA: Diagnosis present

## 2020-06-01 DIAGNOSIS — Z7189 Other specified counseling: Secondary | ICD-10-CM | POA: Diagnosis not present

## 2020-06-01 DIAGNOSIS — I82492 Acute embolism and thrombosis of other specified deep vein of left lower extremity: Secondary | ICD-10-CM | POA: Diagnosis not present

## 2020-06-01 DIAGNOSIS — M1A9XX0 Chronic gout, unspecified, without tophus (tophi): Secondary | ICD-10-CM | POA: Diagnosis present

## 2020-06-01 DIAGNOSIS — N179 Acute kidney failure, unspecified: Secondary | ICD-10-CM | POA: Diagnosis not present

## 2020-06-01 DIAGNOSIS — K219 Gastro-esophageal reflux disease without esophagitis: Secondary | ICD-10-CM | POA: Diagnosis present

## 2020-06-01 DIAGNOSIS — Z602 Problems related to living alone: Secondary | ICD-10-CM | POA: Diagnosis not present

## 2020-06-01 DIAGNOSIS — I5023 Acute on chronic systolic (congestive) heart failure: Secondary | ICD-10-CM | POA: Diagnosis not present

## 2020-06-01 DIAGNOSIS — Z8249 Family history of ischemic heart disease and other diseases of the circulatory system: Secondary | ICD-10-CM | POA: Diagnosis not present

## 2020-06-01 DIAGNOSIS — Z20822 Contact with and (suspected) exposure to covid-19: Secondary | ICD-10-CM | POA: Diagnosis present

## 2020-06-01 DIAGNOSIS — K2970 Gastritis, unspecified, without bleeding: Secondary | ICD-10-CM | POA: Diagnosis present

## 2020-06-01 DIAGNOSIS — I70203 Unspecified atherosclerosis of native arteries of extremities, bilateral legs: Secondary | ICD-10-CM | POA: Diagnosis present

## 2020-06-01 DIAGNOSIS — N1832 Chronic kidney disease, stage 3b: Secondary | ICD-10-CM | POA: Diagnosis present

## 2020-06-01 DIAGNOSIS — R34 Anuria and oliguria: Secondary | ICD-10-CM | POA: Diagnosis not present

## 2020-06-01 DIAGNOSIS — I824Y3 Acute embolism and thrombosis of unspecified deep veins of proximal lower extremity, bilateral: Secondary | ICD-10-CM | POA: Diagnosis not present

## 2020-06-01 DIAGNOSIS — N183 Chronic kidney disease, stage 3 unspecified: Secondary | ICD-10-CM | POA: Diagnosis not present

## 2020-06-01 DIAGNOSIS — Z8719 Personal history of other diseases of the digestive system: Secondary | ICD-10-CM | POA: Diagnosis not present

## 2020-06-01 DIAGNOSIS — G9389 Other specified disorders of brain: Secondary | ICD-10-CM | POA: Diagnosis not present

## 2020-06-01 DIAGNOSIS — E8770 Fluid overload, unspecified: Secondary | ICD-10-CM | POA: Diagnosis not present

## 2020-06-01 DIAGNOSIS — Z8711 Personal history of peptic ulcer disease: Secondary | ICD-10-CM | POA: Diagnosis not present

## 2020-06-01 DIAGNOSIS — Z8679 Personal history of other diseases of the circulatory system: Secondary | ICD-10-CM | POA: Diagnosis not present

## 2020-06-01 LAB — BLOOD GAS, ARTERIAL
Acid-base deficit: 7.8 mmol/L — ABNORMAL HIGH (ref 0.0–2.0)
Bicarbonate: 16 mmol/L — ABNORMAL LOW (ref 20.0–28.0)
Drawn by: 36496
FIO2: 21
O2 Saturation: 96.2 %
Patient temperature: 36.5
pCO2 arterial: 25.4 mmHg — ABNORMAL LOW (ref 32.0–48.0)
pH, Arterial: 7.412 (ref 7.350–7.450)
pO2, Arterial: 82.8 mmHg — ABNORMAL LOW (ref 83.0–108.0)

## 2020-06-01 LAB — BASIC METABOLIC PANEL
Anion gap: 15 (ref 5–15)
BUN: 94 mg/dL — ABNORMAL HIGH (ref 8–23)
CO2: 21 mmol/L — ABNORMAL LOW (ref 22–32)
Calcium: 9.3 mg/dL (ref 8.9–10.3)
Chloride: 98 mmol/L (ref 98–111)
Creatinine, Ser: 2.63 mg/dL — ABNORMAL HIGH (ref 0.61–1.24)
GFR, Estimated: 24 mL/min — ABNORMAL LOW (ref >60)
Glucose, Bld: 153 mg/dL — ABNORMAL HIGH (ref 70–99)
Potassium: 4.7 mmol/L (ref 3.5–5.1)
Sodium: 134 mmol/L — ABNORMAL LOW (ref 135–145)

## 2020-06-01 LAB — CBC
HCT: 42.1 % (ref 39.0–52.0)
Hemoglobin: 14.5 g/dL (ref 13.0–17.0)
MCH: 30.9 pg (ref 26.0–34.0)
MCHC: 34.4 g/dL (ref 30.0–36.0)
MCV: 89.8 fL (ref 80.0–100.0)
Platelets: 103 10*3/uL — ABNORMAL LOW (ref 150–400)
RBC: 4.69 MIL/uL (ref 4.22–5.81)
RDW: 18.7 % — ABNORMAL HIGH (ref 11.5–15.5)
WBC: 12.5 10*3/uL — ABNORMAL HIGH (ref 4.0–10.5)
nRBC: 1.4 % — ABNORMAL HIGH (ref 0.0–0.2)

## 2020-06-01 LAB — HEPARIN LEVEL (UNFRACTIONATED)
Heparin Unfractionated: 0.19 IU/mL — ABNORMAL LOW (ref 0.30–0.70)
Heparin Unfractionated: 0.32 IU/mL (ref 0.30–0.70)

## 2020-06-01 NOTE — Evaluation (Signed)
Occupational Therapy Evaluation Patient Details Name: Richard Townsend MRN: [id number] DOB: 1940-10-13 Today's Date: 06/01/2020    History of Present Illness 79 yo male presenting to ED with BLE edema. VAS [place] Lower extremity Venous (DVT) 05/31/20; started heparin. 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   Clinical Impression   PTA, pt reports he was living with his daughter. Unsure of PLOF and home set up as pt with poor arousal level and cognition making him an unreliable historian. Pt currently requiring Mod-Max A for ADLs and bed mobility. Pt presenting with poor arousal level (falling asleep within seconds of waking), decreased cognition, poor balance, and significant BLE pain (L>R). Pt would benefit from further acute OT to facilitate safe dc. Recommend dc to SNF for further OT to optimize safety, independence with ADLs, and return to PLOF.     Follow Up Recommendations  SNF    Equipment Recommendations  None recommended by OT    Recommendations for Other Services PT consult     Precautions / Restrictions Precautions Precautions: Fall      Mobility Bed Mobility Overal bed mobility: Needs Assistance Bed Mobility: Supine to Sit;Rolling;Sit to Sidelying Rolling: Mod assist;+2 for physical assistance   Supine to sit: Max assist   Sit to sidelying: Min assist;+2 for physical assistance;+2 for safety/equipment General bed mobility comments: Max A to bring BLEs towards and then elevate trunk. Pt requiring Min A for elevating BLEs over EOB in return to bed. Rolling, pt requiring Mod A +2 and Max cues    Transfers                 General transfer comment: Defer for safety due to cognition, arousal, and pain    Balance Overall balance assessment: Needs assistance Sitting-balance support: No upper extremity supported;Feet supported Sitting balance-Leahy Scale: Poor Sitting  balance - Comments: Reliant on physical A                                   ADL either performed or assessed with clinical judgement   ADL Overall ADL's : Needs assistance/impaired Eating/Feeding: Minimal assistance;Bed level   Grooming: Minimal assistance;Bed level   Upper Body Bathing: Moderate assistance;Sitting   Lower Body Bathing: Maximal assistance;Sitting/lateral leans   Upper Body Dressing : Moderate assistance;Sitting;Bed level   Lower Body Dressing: Maximal assistance;Bed level Lower Body Dressing Details (indicate cue type and reason): don socks. Increased pain               General ADL Comments: Pt with limited participation due to arousal, confusion, and pain     Vision         Perception     Praxis      Pertinent Vitals/Pain Pain Assessment: Faces Faces Pain Scale: Hurts even more Pain Location: BLEs Pain Descriptors / Indicators: Constant;Discomfort;Grimacing;Moaning Pain Intervention(s): Monitored during session;Limited activity within patient's tolerance;Repositioned     Hand Dominance Right   Extremity/Trunk Assessment Upper Extremity Assessment Upper Extremity Assessment: Generalized weakness;Difficult to assess due to impaired cognition   Lower Extremity Assessment Lower Extremity Assessment: Defer to PT evaluation   Cervical / Trunk Assessment Cervical / Trunk Assessment: Other exceptions (forward flexion and roudning of shoulders)   Communication Communication Communication: No difficulties   Cognition Arousal/Alertness: Lethargic Behavior During Therapy: Impulsive;Restless Overall Cognitive Status: Impaired/Different from baseline Area of  Impairment: Orientation;Attention;Memory;Following commands;Safety/judgement;Awareness;Problem solving                 Orientation Level: Disoriented to;Place;Situation;Time ("Where am I?" "What are you doing at my home?") Current Attention Level: Focused Memory:  Decreased short-term memory Following Commands: Follows one step commands inconsistently Safety/Judgement: Decreased awareness of safety;Decreased awareness of deficits Awareness: Intellectual Problem Solving: Slow processing;Requires verbal cues General Comments: Pt going into and out of awake and falling asleep (even when sitting upright). Pt starling with loud noises and then becoming frustrated; quickly will switch to sweet and happy. Pt able to state his name and DOB.   General Comments  SpO2 95% on RA    Exercises     Shoulder Instructions      Home Living Family/patient expects to be discharged to:: Private residence                                 Additional Comments: Per pt, he lives with his daughter, Butch Penny. However, prior admission, pt was living alone and pt poor historian due to confusion      Prior Functioning/Environment Level of Independence: Needs assistance        Comments: Per chart review, pt was using a cain and erforming ADLs. Pt unable to provide PLOF        OT Problem List: Decreased strength;Decreased range of motion;Decreased activity tolerance;Impaired balance (sitting and/or standing);Decreased knowledge of use of DME or AE;Decreased knowledge of precautions;Decreased safety awareness;Decreased cognition;Pain;Obesity      OT Treatment/Interventions: Self-care/ADL training;Therapeutic exercise;Energy conservation;DME and/or AE instruction;Therapeutic activities;Patient/family education    OT Goals(Current goals can be found in the care plan section) Acute Rehab OT Goals Patient Stated Goal: Go home OT Goal Formulation: Patient unable to participate in goal setting Time For Goal Achievement: 06/15/20 Potential to Achieve Goals: Good  OT Frequency: Min 2X/week   Barriers to D/C:            Co-evaluation PT/OT/SLP Co-Evaluation/Treatment: Yes Reason for Co-Treatment: Necessary to address cognition/behavior during functional  activity;To address functional/ADL transfers;For patient/therapist safety   OT goals addressed during session: ADL's and self-care      AM-PAC OT "6 Clicks" Daily Activity     Outcome Measure Help from another person eating meals?: A Lot Help from another person taking care of personal grooming?: A Lot Help from another person toileting, which includes using toliet, bedpan, or urinal?: Total Help from another person bathing (including washing, rinsing, drying)?: A Lot Help from another person to put on and taking off regular upper body clothing?: A Lot Help from another person to put on and taking off regular lower body clothing?: Total 6 Click Score: 10   End of Session Nurse Communication: Mobility status  Activity Tolerance: Patient limited by fatigue;Patient limited by lethargy;Patient limited by pain Patient left: in bed;with call bell/phone within reach;with bed alarm set  OT Visit Diagnosis: Other abnormalities of gait and mobility (R26.89);Unsteadiness on feet (R26.81);Muscle weakness (generalized) (M62.81)                Time: 8242-3536 OT Time Calculation (min): 23 min Charges:  OT General Charges $OT Visit: 1 Visit OT Evaluation $OT Eval Moderate Complexity: [place], OTR/L Acute Rehab Pager: [phone] Office: [place] 06/01/2020, 1:10 PM

## 2020-06-01 NOTE — Progress Notes (Addendum)
ANTICOAGULATION CONSULT NOTE - Initial Consult  Pharmacy Consult for Heparin Indication: 12/22 BL LE DVT  Allergies  Allergen Reactions  . Lisinopril Cough    Pt reports significant cough  . Losartan Other (See Comments)    Pt reported cough and chest pain    Patient Measurements: Wt 72.2kg Ht 5'5" Heparin Dosing Weight: 72.2kg  Vital Signs: Temp: 97.4 F (36.3 C) (12/23 0520) Temp Source: Oral (12/23 0520) BP: 127/94 (12/23 0755) Pulse Rate: 91 (12/23 0755)  Labs: Recent Labs    05/30/20 2141 05/31/20 1738 06/01/20 0120 06/01/20 1005  HGB 14.9  --  14.5  --   HCT 45.7  --  42.1  --   PLT 106*  --  103*  --   HEPARINUNFRC  --  0.12* 0.19* 0.32  CREATININE 2.64*  --  2.63*  --     Estimated Creatinine Clearance: 20.5 mL/min (A) (by C-G formula based on SCr of 2.63 mg/dL (H)).   Medical History: Past Medical History:  Diagnosis Date  . AAA (abdominal aortic aneurysm) ([place]) 11/2011  . Abdominal aneurysm without mention of rupture 01/06/2012  . Abdominal tightness 01/11/2013  . Allergic rhinitis   . Arthritis   . DVT (deep venous thrombosis) ([place])   . Dysmetabolic syndrome   . Gastritis and gastroduodenitis   . Generalized osteoarthritis   . GERD (gastroesophageal reflux disease)   . GI bleed 04/13/2019  . GIB (gastrointestinal bleeding) 04/13/2019  . Gout   . Headache(784.0)   . HFrEF (heart failure with reduced ejection fraction) ([place]) 08/06/2019  . History of GI bleed 08/06/2019  . Hypercholesterolemia   . Hypertension   . Ischemic cardiomyopathy 08/06/2019  . Lower extremity edema   . Melena 04/13/2019  . Mixed hyperlipidemia 08/06/2019  . Nausea alone 07/13/2013  . 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 Peptic ulcer   . Shortness of breath   . Symptomatic anemia 04/13/2019  . UGI bleed    Assessment: 79 yo M with acute BLLE DVT 05/31/20. Pharmacy consulted to start heparin given renal function. Of note,  patient with hx of GIB and PUD in the setting of using a lot of Goody Powder, requiring 4 units RBC. 02/28/19 EGD found multiple gastric and duodenal ulcers. He was admitted to [facility] 9/21-10/1/20 and developed LLE DVT, for which he was discharged on apixaban. Unfortunately, he had another GIB 04/13/2019 while on apixaban and received an IVC filter. He is not on any AC PTA, unclear when apixaban was stopped.  HL 0.32 is therapeutic. H/H, plt stable.    Goal of Therapy:  Heparin level 0.3-0.5 units/ml (lower goal for hx multiple severe GIB) Monitor platelets by anticoagulation protocol: Yes   Plan:  Continue Heparin 1400 units/hr  Monitor daily HL, CBC/plt  Monitor for signs/symptoms of bleeding  F/u long term anticoagulation plan   Benetta Spar, PharmD, BCPS, [place] Clinical Pharmacist  Please check AMION for all [place] phone numbers After 10:00 PM, call [place]

## 2020-06-01 NOTE — Progress Notes (Signed)
ANTICOAGULATION CONSULT NOTE - Follow Up Consult  Pharmacy Consult for heparin Indication: DVT  Labs: Recent Labs    05/30/20 2141 05/31/20 1738 06/01/20 0120  HGB 14.9  --  14.5  HCT 45.7  --  42.1  PLT 106*  --  103*  HEPARINUNFRC  --  0.12* 0.19*  CREATININE 2.64*  --   --     Assessment: 79yo male subtherapeutic on heparin after rate change; no gtt issues or signs of bleeding per RN.  Goal of Therapy:  Heparin level 0.3-0.5 units/ml   Plan:  Will increase heparin gtt by 2-3 units/kg/hr to 1400 units/hr and check level in 8 hours.    Wynona Neat, PharmD, BCPS  06/01/2020,2:02 AM

## 2020-06-01 NOTE — Evaluation (Signed)
Physical Therapy Evaluation Patient Details Name: Richard Townsend MRN: [id number] DOB: [birth year] Today's Date: 06/01/2020   History of Present Illness  79 yo male presenting to ED with BLE edema. VAS [place] Lower extremity Venous (DVT) 05/31/20; started heparin. 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  Clinical Impression   Patient received in bed with waxing and waning alertness- sometimes would fall asleep (even when sitting upright) and then wake back up, quite confused when awake/interactive however. Needed heavy physical assist to get to EOB today and physical support to maintain balance even in sitting. BLEs quite painful and sore and would likely limit standing/gait tolerance. Perseverated on needing to use the bathroom but it took [place] quite some time to use urinal successfully. L eye more adducted at rest than R, unsure if this is baseline.  Left in bed positioned to comfort with all needs met, bed alarm active and RN aware of patient status. Recommend SNF and 24/7A moving forward.     Follow Up Recommendations SNF;Supervision/Assistance - 24 hour    Equipment Recommendations  Rolling walker with 5" wheels;3in1 (PT);Wheelchair (measurements PT);Wheelchair cushion (measurements PT)    Recommendations for Other Services       Precautions / Restrictions Precautions Precautions: Fall;Other (comment) Precaution Comments: very painful feet/lower LEs Restrictions Weight Bearing Restrictions: No      Mobility  Bed Mobility Overal bed mobility: Needs Assistance Bed Mobility: Supine to Sit;Rolling;Sit to Sidelying Rolling: Mod assist;+2 for physical assistance   Supine to sit: Max assist   Sit to sidelying: Min assist;+2 for physical assistance;+2 for safety/equipment General bed mobility comments: Max A to bring BLEs towards and then elevate trunk. Pt requiring Min A for elevating BLEs over  EOB in return to bed. Rolling, pt requiring Mod A +2 and Max cues    Transfers                 General transfer comment: Defer for safety due to cognition, arousal, and pain  Ambulation/Gait             General Gait Details: deferred- cognition, arousal, pain  Stairs            Wheelchair Mobility    Modified Rankin (Stroke Patients Only)       Balance Overall balance assessment: Needs assistance Sitting-balance support: No upper extremity supported;Feet supported Sitting balance-Leahy Scale: Poor Sitting balance - Comments: Reliant on physical A                                     Pertinent Vitals/Pain Pain Assessment: Faces Faces Pain Scale: Hurts even more Pain Location: BLEs; L>R Pain Descriptors / Indicators: Constant;Discomfort;Grimacing;Moaning Pain Intervention(s): Limited activity within patient's tolerance;Monitored during session;Repositioned    Home Living Family/patient expects to be discharged to:: Private residence                 Additional Comments: Per pt, he lives with his daughter, Butch Penny. However, prior admission, pt was living alone and pt poor historian due to confusion    Prior Function Level of Independence: Needs assistance         Comments: Per chart review, pt was using a cain and erforming ADLs. Pt unable to provide PLOF     Hand Dominance   Dominant Hand: Right    Extremity/Trunk Assessment  Upper Extremity Assessment Upper Extremity Assessment: Defer to OT evaluation    Lower Extremity Assessment Lower Extremity Assessment: Generalized weakness;Difficult to assess due to impaired cognition    Cervical / Trunk Assessment Cervical / Trunk Assessment: Kyphotic  Communication   Communication: No difficulties  Cognition Arousal/Alertness: Lethargic Behavior During Therapy: Impulsive;Restless Overall Cognitive Status: Impaired/Different from baseline Area of Impairment:  Orientation;Attention;Memory;Following commands;Safety/judgement;Awareness;Problem solving                 Orientation Level: Disoriented to;Place;Situation;Time ("where am I? what are you doing at my home?") Current Attention Level: Focused Memory: Decreased short-term memory Following Commands: Follows one step commands inconsistently Safety/Judgement: Decreased awareness of safety;Decreased awareness of deficits Awareness: Intellectual Problem Solving: Slow processing;Requires verbal cues General Comments: Pt going into and out of awake and falling asleep (even when sitting upright). Pt starling with loud noises and then becoming frustrated; quickly will switch to sweet and happy. Pt able to state his name and DOB.      General Comments General comments (skin integrity, edema, etc.): SPO2 95% on RA; daughter present at beginning of session then left so patient could use urinal    Exercises     Assessment/Plan    PT Assessment Patient needs continued PT services  PT Problem List Decreased strength;Decreased cognition;Decreased knowledge of use of DME;Decreased activity tolerance;Decreased safety awareness;Decreased balance;Decreased mobility;Decreased coordination       PT Treatment Interventions DME instruction;Balance training;Gait training;Stair training;Cognitive remediation;Functional mobility training;Patient/family education;Therapeutic activities;Therapeutic exercise    PT Goals (Current goals can be found in the Care Plan section)  Acute Rehab PT Goals Patient Stated Goal: Go home PT Goal Formulation: Patient unable to participate in goal setting Time For Goal Achievement: 06/15/20 Potential to Achieve Goals: Fair    Frequency Min 2X/week   Barriers to discharge        Co-evaluation   Reason for Co-Treatment: Necessary to address cognition/behavior during functional activity;To address functional/ADL transfers;For patient/therapist safety   OT goals  addressed during session: ADL's and self-care       AM-PAC PT "6 Clicks" Mobility  Outcome Measure Help needed turning from your back to your side while in a flat bed without using bedrails?: A Lot Help needed moving from lying on your back to sitting on the side of a flat bed without using bedrails?: A Lot Help needed moving to and from a bed to a chair (including a wheelchair)?: A Lot Help needed standing up from a chair using your arms (e.g., wheelchair or bedside chair)?: Total Help needed to walk in hospital room?: Total Help needed climbing 3-5 steps with a railing? : Total 6 Click Score: 9    End of Session   Activity Tolerance: Patient limited by lethargy;Other (comment) (and cognition/safety concerns) Patient left: in bed;with call bell/phone within reach;with bed alarm set Nurse Communication: Mobility status PT Visit Diagnosis: Unsteadiness on feet (R26.81);Difficulty in walking, not elsewhere classified (R26.2);Pain;Muscle weakness (generalized) (M62.81) Pain - Right/Left:  (bilateral) Pain - part of body: Leg    Time: 1202-1225 PT Time Calculation (min) (ACUTE ONLY): 23 min   Charges:   PT Evaluation $PT Eval Moderate Complexity: 1 Mod (co-eval with OT)          Windell Norfolk, DPT, PN1   Supplemental Physical Therapist [place]    Pager [phone] Acute Rehab Office [phone]

## 2020-06-01 NOTE — Progress Notes (Addendum)
[facility]  Subjective  - Left LE with pain in plantar foot and anterior shin.   Objective (!) 130/100 94 (!) 97.4 F (36.3 C) (Oral) 18 95%  Intake/Output Summary (Last 24 hours) at 06/01/2020 0741 Last data filed at 06/01/2020 0254 Gross per 24 hour  Intake --  Output 300 ml  Net -300 ml    Active motor B feet, legs warm to touch Feet appear dusky without change Doppler DP B Lungs non labored breathing   Assessment/Planning: acute DVT bilaterally some mixed venous disease and peripheral artery disease. Rest pain left LE Heparin for DVT We will follow no intervention planned from a vascular point of view.    Roxy Horseman 06/01/2020 7:41 AM --  Laboratory Lab Results: Recent Labs    05/30/20 2141 06/01/20 0120  WBC 14.3* 12.5*  HGB 14.9 14.5  HCT 45.7 42.1  PLT 106* 103*   BMET Recent Labs    05/30/20 2141 06/01/20 0120  NA 130* 134*  K 4.7 4.7  CL 98 98  CO2 16* 21*  GLUCOSE 137* 153*  BUN 94* 94*  CREATININE 2.64* 2.63*  CALCIUM 9.3 9.3    COAG Lab Results  Component Value Date   INR 1.1 04/30/2020   INR 1.3 (H) 04/13/2019   INR 1.06 11/28/2011   No results found for: PTT  I have examined the patient, reviewed and agree with above.  Discussed with daughter present.  Multiple medical issues.  Generally failing health.  The daughter reports that he has been in significant decline since his wife passed away within the year.  Main issue is his bilateral acute DVT and is being treated appropriately with heparin.  No role for thrombolysis.  No evidence of threatened viability with phlegmasia.  Chronic SFA occlusions.  Explained to daughter we will follow from sidelines.  No acute vascular issues  Curt Jews, MD 06/01/2020 2:16 PM

## 2020-06-01 NOTE — Hospital Course (Addendum)
Richard Townsend is a 79 y.o. male presenting with worsening lower extremity edema, found to have DVT and PAD. 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   Comfort care Palliative care were consulted early on during the admission in the setting of progressively declining health due to the medical problems listed below.  Richard Townsend was made comfort care on 12/24 after speaking with his daughter and son in law. Patient was discharged to [facility] on 12/25 via [facility]. Patient is DNR, DNI.  Acute on Chronic HFrEF (EF 30-35%) ED labs significant for BNP > 4,500. WBC increased to 14.3, hyponatremic to 130, bicarb 16. CXR with cardiomegaly, pulmonary congestion and interstitial edema, consistent with CHF exacerbation. Examination significant for 3+ pitting edema b/l feet to distal leg, with 1+ edema from mid leg to tibial tuberosities. Received 80 mg IV Lasix x1 in ED.   CAD  Ischemic Cardiomyopathy  Hx MI  Hx AAA s/p repair  DVT s/p IVC, acute  Left lower extremity with extensive DVT involving the common femoral vein, SFJ, posterior tibial veins and peroneal vein. Right lower extremity DVT in the tibial vessels.Patient with IVC filter placed 11/20. Patient was started on therapeutic heparin. Bilateral superficial femoral artery occlusion seen on venous duplex. VVS consulted and stated he would benefit from arteriogram to further evaluate arterial disease but because of his AKI on CKD recommended medical management prior to arteriogram

## 2020-06-01 NOTE — Telephone Encounter (Signed)
     [name] called and cancelled appt pt with Dr. Angelena Form. Pt is in the hospital and won't be home on time. He would like to know if pt can get another appt with Dr. Angelena Form once he's back home

## 2020-06-01 NOTE — Progress Notes (Signed)
Family Medicine Teaching Service Daily Progress Note Intern Pager: [phone]  Patient name: Richard Townsend Medical record number: [id number] Date of birth: 11/19/1940 Age: 79 y.o. Gender: male  Primary Care Provier: Ernestene Kiel, MD Consultants: vascular surgery Code Status: Full   Pt Overview and Major Events to Date:  Richard Townsend is a 79 y.o. male presenting with worsening lower extremity edema, found to have DVT and PAD. PMH is significant for is significant forHFrEF, HTN, CAD, MI, AAA s/p repair, GI bleed, DVT s/p IVC filter 04/2019, hyperlipidemia.  Assessment and Plan:  Acute on Chronic HFrEF (EF 30-35%) Current weight: 69.7kg. 72.2 on 12/14 Total output -669ml since admission.  CXR with cardiomegaly, pulmonary congestion and interstitial edema, consistent with CHF exacerbation.  Patient was recently admitted for similar presentation last month. Echo at that time significant for valvular disease and EF 30-35%. - Admit to observation med-tele, attending Dr. Andria Frames  - Consider Palliative consult - Cardiology consult, appreciate recommendations - Continuous cardiac monitoring, pulse ox - IV Lasix 80 mg BID - Monitor electrolytes with daily BMP, keep K>4, Mg>2 - Continue home Imdur 60 mg daily  - Continue Metoprolol 12.5 mg daily  - Strict I/O, daily weights - Up with assistance - PT/OT eval and treat  CAD  Ischemic Cardiomyopathy  Hx MI  Hx AAA s/p repair  DVT s/p IVC, acute  Left lower extremity with extensive DVT involving the common femoral vein, SFJ, posterior tibial veins and peroneal vein. Right lower extremity DVT in the tibial vessels.Patient with IVC filter placed 11/20. Bilateral superficial femoral artery occlusion seen on venous duplex. Patient with edematous and cold extremities on exam, unable to appreciate pulses. Per VVS, he would benefit from arteriogram to further evaluate arterial disease but because of his AKI on CKD would recommend medical  management prior to arteriogram  - Continue IV heparin  - f/u VVS recs   AKI on CKD Stage 3b Admission creatinine of 2.64>2.63, baseline creatinine unclear but seems to be around 1.7-1.8. - Avoid nephrotoxic drugs as able - Monitor with daily BMP  Thrombocytopenia: chronic Platelets 106 on admission. Per chart reviews, patient has been thrombocytopenic since last admission with platelets around 115-140. Last year, was 330-480.  - Monitor with daily CBC - Consider outpatient work-up   Hx GI bleed  GERD  Hx Stomach Ulcer Home medications notable for Famotidine 40 mg, carafate 1 mg, ferrous sulfate 325 mg, Protonix 40 mg BID.  - Continue home Pepcid, iron and protonix   COPD: chronic, stable Home medication includes albuterol PRN.  - Albuterol PRN  Hyperlipidemia: chronic, stable On Atorvastatin 20 mg daily at home. Unsure of last lipid panel.  - Continue home atorvastatin - Consider lipid panel  Gout: chronic, stable - Continue home allopurinol 300 mg daily   Tobacco Abuse:  Current smoker, has cut down significantly. Previously smoked 3-4 ppd, now only 3 cigarettes/day. Has smoked since he was a teenager.  - Nicotine patch PRN - Encourage smoking cessation  Acute Grief Reaction Patient notes recent passing of his son and his wife. Is intermittently tearful on examination when speaking about this. Lives alone, admits to being lonely. Patient is DNR/DNI and states "I want to be with my wife". Not on any medications for depression. Due to his chronic medical conditions, discussion about palliative care has been brought up in the past.  - Monitor mood - Consider palliative consult - Consider SSRI outpatient   FEN/GI: Heart healthy Prophylaxis: SCD's  Disposition: Med-tele  Subjective:  No acute events overnight. When I examined patient he would quickly fall asleep. Denies any concerns or complaints this morning  Objective: Temp:  [97.3 F (36.3 C)-98.2 F (36.8  C)] 97.4 F (36.3 C) (12/23 0520) Pulse Rate:  [87-108] 94 (12/23 0520) Resp:  [16-27] 18 (12/23 0520) BP: (109-145)/(77-100) 130/100 (12/23 0520) SpO2:  [92 %-98 %] 95 % (12/23 0520) Weight:  [69.7 kg] 69.7 kg (12/23 0520) Physical Exam: General: sleeping, unable to stay awake, NAD Cardiovascular: RRR no murmurs  Respiratory: CTAB normal WOB Abdomen: soft, non distended, non tender  Extremities: feet cold, about 2+ pitting edema of BLE. Tender to palpation   Laboratory: Recent Labs  Lab 05/30/20 2141 06/01/20 0120  WBC 14.3* 12.5*  HGB 14.9 14.5  HCT 45.7 42.1  PLT 106* 103*   Recent Labs  Lab 05/30/20 2141 06/01/20 0120  NA 130* 134*  K 4.7 4.7  CL 98 98  CO2 16* 21*  BUN 94* 94*  CREATININE 2.64* 2.63*  CALCIUM 9.3 9.3  GLUCOSE 137* 153*     Imaging/Diagnostic Tests:  CT HEAD WO CONTRAST  Result Date: 06/01/2020 CLINICAL DATA:  Altered mental status. EXAM: CT HEAD WITHOUT CONTRAST TECHNIQUE: Contiguous axial images were obtained from the base of the skull through the vertex without intravenous contrast. COMPARISON:  02/28/2019 from [facility] FINDINGS: Brain: No evidence of acute infarction, hemorrhage, hydrocephalus, extra-axial collection, or mass lesion/mass effect. Mild-to-moderate diffuse cerebral atrophy is again seen. Mild chronic small vessel disease is also stable. 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 Vascular:  No hyperdense vessel or other acute findings. Skull: No evidence of fracture or other significant bone abnormality. Sinuses/Orbits:  No acute findings. Other: None. IMPRESSION: No acute intracranial abnormality. Stable cerebral atrophy, chronic small vessel disease, and old bilateral basal ganglia lacunar infarcts. Electronically Signed   By: Marlaine Hind M.D.   On: 06/01/2020 14:19    Shary Key, DO 06/01/2020, 7:27 AM PGY-1, [place] Intern pager: [phone], text pages welcome

## 2020-06-01 NOTE — Progress Notes (Signed)
PT Cancellation Note  Patient Details Name: Richard Townsend MRN: [id number] DOB: September 21, 1940   Cancelled Treatment:    Reason Eval/Treat Not Completed: Medical issues which prohibited therapy patient with acute BLE DVTs, heparin is still subtherapeutic per most recent available values. Additionally, per dept protocol he will need to be on heparin for 24 hours (and in therapeutic range) for safe mobility with PT.   Ann Lions PT, DPT, PN1   Supplemental Physical Therapist [place]    Pager [phone] Acute Rehab Office [phone]

## 2020-06-01 NOTE — Progress Notes (Signed)
FPTS Interim Progress Note  S: Dr. Nita Sells and I evaluated patient this evening about 2030 to get a baseline mental status.  He was alert and answered most simple questions appropriately but was also confused and made some comments out of context. He would drift off when not being directly addressed but was easily aroused again by name. He complained of pain in his legs and the nurse was there with his pain medications at time of evaluation.  She stated that he seemed more confused now than at the beginning of her shift.  Dr. Nita Sells, who admitted him endorses that he is more confused than he was yesterday. Family agreed with these assessments that he is not at baseline.   O: BP 134/89 (BP Location: Left Arm)   Pulse 94   Temp 97.8 F (36.6 C) (Oral)   Resp 18   Ht 5\' 5"  (1.651 m)   Wt 69.8 kg   SpO2 100%   BMI 25.61 kg/m   General: NAD, pleasant, able to participate in exam Respiratory: CTAB, normal effort, No wheezes, rales or rhonchi Extremities: acute tenderness to palpation of left calf area, redness and non-pitting swelling Skin: warm and dry, no rashes noted Neuro: alert and easily redirected when confused  A/P: Patient has altered mental status that could likely be associated with hospital delirium. Head CT was normal. Concern about PE given DVT but 100% O2 on room air and not complaining of respiratory distress, normal HR.   - monitor for status changes - delirium precautions - ABG  Richarda Osmond, DO 06/01/2020, 9:24 PM PGY-3, [place] Medicine Service pager [phone]

## 2020-06-02 ENCOUNTER — Inpatient Hospital Stay (HOSPITAL_COMMUNITY): Payer: Medicare HMO

## 2020-06-02 DIAGNOSIS — Z515 Encounter for palliative care: Secondary | ICD-10-CM

## 2020-06-02 DIAGNOSIS — Z66 Do not resuscitate: Secondary | ICD-10-CM

## 2020-06-02 DIAGNOSIS — N179 Acute kidney failure, unspecified: Secondary | ICD-10-CM

## 2020-06-02 DIAGNOSIS — Z7189 Other specified counseling: Secondary | ICD-10-CM

## 2020-06-02 DIAGNOSIS — R34 Anuria and oliguria: Secondary | ICD-10-CM

## 2020-06-02 LAB — CBC
HCT: 43.3 % (ref 39.0–52.0)
Hemoglobin: 14.9 g/dL (ref 13.0–17.0)
MCH: 30.8 pg (ref 26.0–34.0)
MCHC: 34.4 g/dL (ref 30.0–36.0)
MCV: 89.5 fL (ref 80.0–100.0)
Platelets: 96 10*3/uL — ABNORMAL LOW (ref 150–400)
RBC: 4.84 MIL/uL (ref 4.22–5.81)
RDW: 19.2 % — ABNORMAL HIGH (ref 11.5–15.5)
WBC: 18.9 10*3/uL — ABNORMAL HIGH (ref 4.0–10.5)
nRBC: 1.1 % — ABNORMAL HIGH (ref 0.0–0.2)

## 2020-06-02 LAB — BASIC METABOLIC PANEL
Anion gap: 17 — ABNORMAL HIGH (ref 5–15)
BUN: 110 mg/dL — ABNORMAL HIGH (ref 8–23)
CO2: 18 mmol/L — ABNORMAL LOW (ref 22–32)
Calcium: 9.3 mg/dL (ref 8.9–10.3)
Chloride: 98 mmol/L (ref 98–111)
Creatinine, Ser: 3.15 mg/dL — ABNORMAL HIGH (ref 0.61–1.24)
GFR, Estimated: 20 mL/min — ABNORMAL LOW (ref >60)
Glucose, Bld: 117 mg/dL — ABNORMAL HIGH (ref 70–99)
Potassium: 5 mmol/L (ref 3.5–5.1)
Sodium: 133 mmol/L — ABNORMAL LOW (ref 135–145)

## 2020-06-02 LAB — HEPARIN LEVEL (UNFRACTIONATED)
Heparin Unfractionated: 0.2 IU/mL — ABNORMAL LOW (ref 0.30–0.70)
Heparin Unfractionated: 0.34 IU/mL (ref 0.30–0.70)

## 2020-06-02 LAB — AMMONIA: Ammonia: 21 umol/L (ref 9–35)

## 2020-06-02 MED ORDER — BUSPIRONE HCL 5 MG PO TABS
5.0000 mg | ORAL_TABLET | Freq: Two times a day (BID) | ORAL | Status: DC
  Administered 2020-06-02: 13:00:00 5 mg via ORAL
  Filled 2020-06-02 (×2): qty 1

## 2020-06-02 MED ORDER — HYDROMORPHONE HCL 1 MG/ML IJ SOLN: 0.5000 mg | INTRAMUSCULAR | Status: DC | PRN

## 2020-06-02 MED ORDER — LORAZEPAM 2 MG/ML IJ SOLN: 0.5000 mg | INTRAMUSCULAR | Status: DC | PRN

## 2020-06-02 MED ORDER — BISACODYL 10 MG RE SUPP: 10.0000 mg | Freq: Every day | RECTAL | Status: DC | PRN

## 2020-06-02 MED ORDER — ALLOPURINOL 100 MG PO TABS
200.0000 mg | ORAL_TABLET | Freq: Every day | ORAL | Status: DC
  Administered 2020-06-03: 11:00:00 200 mg via ORAL
  Filled 2020-06-02: qty 2

## 2020-06-02 MED ORDER — GLYCOPYRROLATE 0.2 MG/ML IJ SOLN: 0.4000 mg | INTRAMUSCULAR | Status: DC | PRN

## 2020-06-02 NOTE — Progress Notes (Addendum)
   Palliative Medicine Inpatient Follow Up Note  Received a call from patients son in law [name] and daughter, Butch Penny this evening. We discussed the prior conversation had with Dr. Jeani Hawking. I shared with Wynne' family that at this point if our goals are truly comfort and symptom relief that changing our focus to comfort oriented care might be of value.  We talked about transition to comfort measures in house and what that would entail inclusive of medications to control pain, dyspnea, agitation, nausea, itching, and hiccups.   We discussed stopping all uneccessary measures such as heparin gtt, polypharmacy, blood draws, needle sticks, and frequent vital signs.   Patients family endorses that at this point they just want Jevin to spend time with his grandchildren. They realize that his prognosis is poor and that his time will be short.   Decision was made to change focus from aggressive measure to comfort focused care.   SUMMARY OF RECOMMENDATIONS  DNAR/DNI  E-MOST completed and can be found in [facility]  Change to comfort care  Liberalize visitation  Medication per [facility]   Patients family would like patient to transition to [place] [facility] home  Ongoing Palliative Support   Time Spent: 35 Greater than 50% of the time was spent in counseling and coordination of care ______________________________________________________________________________________ [place] Team Team Cell Phone: [phone] Please utilize secure chat with additional questions, if there is no response within 30 minutes please call the above phone number  Palliative Medicine Team providers are available by phone from 7am to 7pm daily and can be reached through the team cell phone.  Should this patient require assistance outside of these hours, please call the patient's attending physician.

## 2020-06-02 NOTE — Consult Note (Signed)
[facility] Renal Consultation Note  Requesting MD: Hensel Indication for Consultation: A on CRF  HPI:  Richard Townsend is a 79 y.o. male with past medical history significant for hypertension, coronary artery disease and CHF, ejection fraction of 30 to 35%, DVTs status post IVC filter.  He also appears to have CKD with creatinine in the 1.6-1.9 range with solitary functioning kidney.  Patient presented to medical attention on 12/21 with complaints of worsening lower extremity edema after outpatient Lasix was decreased.  He had elevated BNP and creatinine 2.6 (was 1.88 on 11/23).  He was admitted, attempted to be diuresed-urine output has not been well recorded-weight is trending up.  Creatinine also trending up, today at 3.1 with BUN of 110 and potassium of 5.  This is the reason for consult.  Renal ultrasound did not show hydronephrosis-no urinalysis for review.  No low blood pressures recorded.  Several doses of IV Lasix given, now held.  Also of note, is a DNR-has met with palliative care and really seems to be in more of a comfort mode from their notes-looking at going home with hospice.  Patient is somnolent-  Unable to participate in conversations  Creatinine, Ser  Date/Time Value Ref Range Status  06/02/2020 09:26 AM 3.15 (H) 0.61 - 1.24 mg/dL Final  06/01/2020 01:20 AM 2.63 (H) 0.61 - 1.24 mg/dL Final  05/30/2020 09:41 PM 2.64 (H) 0.61 - 1.24 mg/dL Final  05/02/2020 03:14 AM 1.88 (H) 0.61 - 1.24 mg/dL Final  05/01/2020 02:34 PM 1.88 (H) 0.61 - 1.24 mg/dL Final  05/01/2020 04:35 AM 1.77 (H) 0.61 - 1.24 mg/dL Final  04/30/2020 09:46 PM 1.65 (H) 0.61 - 1.24 mg/dL Final  04/30/2020 12:22 PM 1.93 (H) 0.61 - 1.24 mg/dL Final  04/26/2020 10:55 AM 1.77 (H) 0.76 - 1.27 mg/dL Final  02/22/2020 01:38 PM 1.18 0.76 - 1.27 mg/dL Final  09/03/2019 10:53 AM 1.94 (H) 0.76 - 1.27 mg/dL Final  04/15/2019 02:17 PM 1.77 (H) 0.61 - 1.24 mg/dL Final  04/14/2019 06:05 PM 1.86 (H) 0.61 - 1.24  mg/dL Final  04/14/2019 03:05 AM 1.69 (H) 0.61 - 1.24 mg/dL Final  04/13/2019 12:03 PM 2.21 (H) 0.61 - 1.24 mg/dL Final  04/12/2019 11:53 AM 1.86 (H) 0.76 - 1.27 mg/dL Final  11/29/2011 04:30 AM 0.81 0.50 - 1.35 mg/dL Final  11/28/2011 11:30 AM 0.75 0.50 - 1.35 mg/dL Final  11/27/2011 08:15 AM 0.84 0.50 - 1.35 mg/dL Final     PMHx:   Past Medical History:  Diagnosis Date  . AAA (abdominal aortic aneurysm) ([place]) 11/2011  . Abdominal aneurysm without mention of rupture 01/06/2012  . Abdominal tightness 01/11/2013  . Allergic rhinitis   . Arthritis   . DVT (deep venous thrombosis) ([place])   . Dysmetabolic syndrome   . Gastritis and gastroduodenitis   . Generalized osteoarthritis   . GERD (gastroesophageal reflux disease)   . GI bleed 04/13/2019  . GIB (gastrointestinal bleeding) 04/13/2019  . Gout   . Headache(784.0)   . HFrEF (heart failure with reduced ejection fraction) ([place]) 08/06/2019  . History of GI bleed 08/06/2019  . Hypercholesterolemia   . Hypertension   . Ischemic cardiomyopathy 08/06/2019  . Lower extremity edema   . Melena 04/13/2019  . Mixed hyperlipidemia 08/06/2019  . Nausea alone 07/13/2013  . 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 Peptic ulcer   . Shortness of breath   . Symptomatic anemia 04/13/2019  . UGI bleed  Past Surgical History:  Procedure Laterality Date  . ABDOMINAL AORTIC ANEURYSM REPAIR  11/28/11   stent   . ABDOMINAL AORTIC ANEURYSM REPAIR    . APPENDECTOMY    . BIOPSY  04/15/2019   Procedure: BIOPSY;  Surgeon: Thornton Park, MD;  Location: [facility];  Service: Gastroenterology;;  . cataracts    . ESOPHAGOGASTRODUODENOSCOPY (EGD) WITH PROPOFOL N/A 04/15/2019   Procedure: ESOPHAGOGASTRODUODENOSCOPY (EGD) WITH PROPOFOL;  Surgeon: Thornton Park, MD;  Location: [facility];  Service: Gastroenterology;  Laterality: N/A;  . EYE SURGERY  2009   Left cataract  . 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 FOOT SURGERY    . IR IVC FILTER PLMT / S&I /IMG GUID/MOD SED  04/14/2019  . KNEE ARTHROSCOPY    . TONSILLECTOMY      Family Hx:  Family History  Problem Relation Age of Onset  . Cancer Mother   . Hypertension Mother   . Leukemia Mother   . Prostate cancer Father   . Cancer Sister   . Hypertension Sister   . Hypertension Brother   . Hypertension Daughter   . Heart attack Maternal Grandfather     Social History:  reports that he has been smoking cigarettes. He has a 12.75 pack-year smoking history. He has never used smokeless tobacco. He reports that he does not drink alcohol and does not use drugs.  Allergies:  Allergies  Allergen Reactions  . Lisinopril Cough    Pt reports significant cough  . Losartan Other (See Comments)    Pt reported cough and chest pain    Medications: Prior to Admission medications   Medication Sig Start Date End Date Taking? Authorizing Provider  acetaminophen (TYLENOL) 325 MG tablet Take 650 mg by mouth every 6 (six) hours as needed for mild pain.    Yes [provider]  albuterol (VENTOLIN HFA) 108 (90 Base) MCG/ACT inhaler Inhale 2 puffs into the lungs daily as needed for wheezing or shortness of breath.  10/30/19  Yes [provider]  allopurinol (ZYLOPRIM) 300 MG tablet Take 300 mg by mouth daily. 06/24/12  Yes [provider]  atorvastatin (LIPITOR) 40 MG tablet Take 20 mg by mouth daily.   Yes [provider]  beta carotene w/minerals (OCUVITE) tablet Take 1 tablet by mouth daily.   Yes [provider]  famotidine (PEPCID) 40 MG tablet Take 40 mg by mouth every evening.  02/15/20  Yes [provider]  ferrous sulfate 324 MG TBEC Take 324 mg by mouth daily with breakfast.   Yes [provider]  furosemide (LASIX) 40 MG tablet Take 1 tablet (40 mg total) by mouth 2 (two) times daily. Patient taking differently: Take 20-40 mg by mouth See admin instructions. Take 1 tablet  (49m) by mouth in the morning and 1/2 tablet (269m by mouth in the evening. 11/12/19  Yes Tobb, Kardie, DO  gabapentin (NEURONTIN) 300 MG capsule Take 300 mg by mouth at bedtime as needed (pain). 07/19/19  Yes [provider]  HYDROcodone-acetaminophen (NORCO/VICODIN) 5-325 MG tablet Take 1 tablet by mouth every 6 (six) hours as needed for moderate pain.  04/20/20  Yes [provider]  isosorbide mononitrate (IMDUR) 60 MG 24 hr tablet TAKE 1 TABLET(60 MG) BY MOUTH DAILY Patient taking differently: Take 60 mg by mouth daily. 02/16/20  Yes Tobb, Kardie, DO  metoprolol succinate (TOPROL-XL) 25 MG 24 hr tablet Take 0.5 tablets (12.5 mg total) by mouth daily. 05/03/20  Yes  Ezequiel Essex, MD  nitroGLYCERIN (NITROSTAT) 0.4 MG SL tablet Place 1 tablet (0.4 mg total) under the tongue every 5 (five) minutes as needed. Patient taking differently: Place 0.4 mg under the tongue every 5 (five) minutes as needed for chest pain. 08/20/19 04/30/20 Yes Tobb, Kardie, DO  pantoprazole (PROTONIX) 40 MG tablet Take 40 mg by mouth daily.   Yes [provider]  potassium chloride (KLOR-CON) 10 MEQ tablet Take 1 tablet (10 mEq total) by mouth daily. 05/02/20  Yes Ezequiel Essex, MD  sucralfate (CARAFATE) 1 g tablet Take 1 g by mouth 3 (three) times daily.   Yes [provider]  SYMBICORT 160-4.5 MCG/ACT inhaler Inhale 2 puffs into the lungs daily as needed (for asthma).  10/21/19  Yes [provider]    I have reviewed the patient's current medications.  Labs:  Results for orders placed or performed during the hospital encounter of 05/30/20 (from the past 48 hour(s))  Heparin level (unfractionated)     Status: Abnormal   Collection Time: 05/31/20  5:38 PM  Result Value Ref Range   Heparin Unfractionated 0.12 (L) 0.30 - 0.70 IU/mL    Comment: (NOTE) If heparin results are below expected values, and patient dosage has  been confirmed, suggest follow up testing of antithrombin  III levels. Performed at [facility], [place] [address] [place], [place] [address]   Basic metabolic panel     Status: Abnormal   Collection Time: 06/01/20  1:20 AM  Result Value Ref Range   Sodium 134 (L) 135 - 145 mmol/L   Potassium 4.7 3.5 - 5.1 mmol/L   Chloride 98 98 - 111 mmol/L   CO2 21 (L) 22 - 32 mmol/L   Glucose, Bld 153 (H) 70 - 99 mg/dL    Comment: Glucose reference range applies only to samples taken after fasting for at least 8 hours.   BUN 94 (H) 8 - 23 mg/dL   Creatinine, Ser 2.63 (H) 0.61 - 1.24 mg/dL   Calcium 9.3 8.9 - 10.3 mg/dL   GFR, Estimated 24 (L) >60 mL/min    Comment: (NOTE) Calculated using the CKD-EPI Creatinine Equation (2021)    Anion gap 15 5 - 15    Comment: Performed at [facility] [address] [place], [place] [address]  Heparin level (unfractionated)     Status: Abnormal   Collection Time: 06/01/20  1:20 AM  Result Value Ref Range   Heparin Unfractionated 0.19 (L) 0.30 - 0.70 IU/mL    Comment: (NOTE) If heparin results are below expected values, and patient dosage has  been confirmed, suggest follow up testing of antithrombin III levels. Performed at [facility], [place] [address] [place], [place] [address]   CBC     Status: Abnormal   Collection Time: 06/01/20  1:20 AM  Result Value Ref Range   WBC 12.5 (H) 4.0 - 10.5 K/uL   RBC 4.69 4.22 - 5.81 MIL/uL   Hemoglobin 14.5 13.0 - 17.0 g/dL   HCT 42.1 39.0 - 52.0 %   MCV 89.8 80.0 - 100.0 fL   MCH 30.9 26.0 - 34.0 pg   MCHC 34.4 30.0 - 36.0 g/dL   RDW 18.7 (H) 11.5 - 15.5 %   Platelets 103 (L) 150 - 400 K/uL    Comment: REPEATED TO VERIFY SPECIMEN CHECKED FOR CLOTS Immature Platelet Fraction may be clinically indicated, consider ordering this additional test [id number] CONSISTENT WITH PREVIOUS RESULT    nRBC 1.4 (  H) 0.0 - 0.2 %    Comment: Performed at [facility], [place] [address] [place], [place] [address]  Heparin level (unfractionated)     Status: None    Collection Time: 06/01/20 10:05 AM  Result Value Ref Range   Heparin Unfractionated 0.32 0.30 - 0.70 IU/mL    Comment: (NOTE) If heparin results are below expected values, and patient dosage has  been confirmed, suggest follow up testing of antithrombin III levels. Performed at [facility], [place] [address] [place], [place] [address]   Blood gas, arterial     Status: Abnormal   Collection Time: 06/01/20  9:43 PM  Result Value Ref Range   FIO2 21.00    pH, Arterial 7.412 7.350 - 7.450   pCO2 arterial 25.4 (L) 32.0 - 48.0 mmHg   pO2, Arterial 82.8 (L) 83.0 - 108.0 mmHg   Bicarbonate 16.0 (L) 20.0 - 28.0 mmol/L   Acid-base deficit 7.8 (H) 0.0 - 2.0 mmol/L   O2 Saturation 96.2 %   Patient temperature 36.5    Collection site LEFT RADIAL    Drawn by [id number]    Sample type ARTERIAL    Allens test (pass/fail) PASS PASS    Comment: Performed at [facility], [place] [address] [place], [place] [address]  CBC     Status: Abnormal   Collection Time: 06/02/20  3:46 AM  Result Value Ref Range   WBC 18.9 (H) 4.0 - 10.5 K/uL   RBC 4.84 4.22 - 5.81 MIL/uL   Hemoglobin 14.9 13.0 - 17.0 g/dL   HCT 43.3 39.0 - 52.0 %   MCV 89.5 80.0 - 100.0 fL   MCH 30.8 26.0 - 34.0 pg   MCHC 34.4 30.0 - 36.0 g/dL   RDW 19.2 (H) 11.5 - 15.5 %   Platelets 96 (L) 150 - 400 K/uL    Comment: REPEATED TO VERIFY SPECIMEN CHECKED FOR CLOTS Immature Platelet Fraction may be clinically indicated, consider ordering this additional test [id number] CONSISTENT WITH PREVIOUS RESULT    nRBC 1.1 (H) 0.0 - 0.2 %    Comment: Performed at [facility], [place] [address], [place], [place] [address]  Heparin level (unfractionated)     Status: Abnormal   Collection Time: 06/02/20  3:46 AM  Result Value Ref Range   Heparin Unfractionated 0.20 (L) 0.30 - 0.70 IU/mL    Comment: (NOTE) If heparin results are below expected values, and patient dosage has  been confirmed, suggest follow up testing of antithrombin  III levels. Performed at [facility], [place] [address], [place], [place] [address]   Basic metabolic panel     Status: Abnormal   Collection Time: 06/02/20  9:26 AM  Result Value Ref Range   Sodium 133 (L) 135 - 145 mmol/L   Potassium 5.0 3.5 - 5.1 mmol/L   Chloride 98 98 - 111 mmol/L   CO2 18 (L) 22 - 32 mmol/L   Glucose, Bld 117 (H) 70 - 99 mg/dL    Comment: Glucose reference range applies only to samples taken after fasting for at least 8 hours.   BUN 110 (H) 8 - 23 mg/dL   Creatinine, Ser 3.15 (H) 0.61 - 1.24 mg/dL   Calcium 9.3 8.9 - 10.3 mg/dL   GFR, Estimated 20 (L) >60 mL/min    Comment: (NOTE) Calculated using the CKD-EPI Creatinine Equation (2021)    Anion gap 17 (H) 5 - 15    Comment: Performed at  [facility], [place] [address], [place], [place] [address]  Heparin level (unfractionated)     Status: None   Collection Time: 06/02/20 12:49 PM  Result Value Ref Range   Heparin Unfractionated 0.34 0.30 - 0.70 IU/mL    Comment: (NOTE) If heparin results are below expected values, and patient dosage has  been confirmed, suggest follow up testing of antithrombin III levels. Performed at [facility], [place] [address], [place], [place] [address]   Ammonia     Status: None   Collection Time: 06/02/20 12:49 PM  Result Value Ref Range   Ammonia 21 9 - 35 umol/L    Comment: Performed at [facility], [place] [address] [place], [place] [address]     ROS:  Review of systems not obtained due to patient factors.  Physical Exam: Vitals:   06/02/20 0920 06/02/20 1208  BP: (!) 133/93 135/90  Pulse: 89 88  Resp:  16  Temp:  97.8 F (36.6 C)  SpO2:  94%     General: elderly-  Will arouse but not stay awake to answer questions-  No pain HEENT: PERRLA, EOMI- mucous membranes dry Neck: cannot appreciate JVD Heart: RRR Lungs: poor effort Abdomen: soft, non tender Extremities: pitting edema and ischemic looking feet Neuro: somnolent  Assessment/Plan: 79  year old WM with multiple medical problems including CKD, CHF, chronic LE wounds and FTT of late-  Presented with CHF and status has worsened during hospitalization 1.Renal- CKD at baseline-  Worsening during the course of hospitalization for not one cause-  Suspect multifactorial etiology and not easy to fix.  Appropriate conversations being held and patient/family really opting for more conservative care at this time.  Dialysis has not been specifically addressed but it seems it would go against everything that has been decided to date.  I now see note from resident who confirms that patient would not want dialysis 2. Hypertension/volume  - possibly some overloaded but no oxygen requirement and is comfortable-  Would not give anything for this.  IF becomes a comfort issue-  Hospice can give diuretics  I am afraid that I do not have much to offer this patient-  Sounds like the care trajectory is looking to be home with hospice.  I will not see daily, call if any questions    Louis Meckel 06/02/2020, 3:08 PM

## 2020-06-02 NOTE — Progress Notes (Signed)
Family Medicine Teaching Service Daily Progress Note Intern Pager: [phone]  Patient name: Richard Townsend Medical record number: [id number] Date of birth: May 04, 1941 Age: 79 y.o. Gender: male  Primary Care Provider: Ernestene Kiel, MD Consultants: Vascular surgery Code Status: Full  Pt Overview and Major Events to Date:  Richard Townsend a 79 y.o.malepresenting with worsening lower extremity edema, found to have DVT and PAD. Of note, recently hospitalized on 04/30/2020 with similar symptoms and declined further VVS evaluation and intervention. 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  Assessment and Plan:  Acute on ChronicHFrEF(EF 30-35%) Patient overall appears euvolemic.  Some swelling in his feet, but ankles and calves nonedematous.  Lungs clear to auscultation this morning, but limited by poor air movement due to effort and confusion.  Weight on admission 69.7 kg, now 70.2 kg this morning.  Net output last 24 hours +59.7 mL, however overall -590.3 mL since admission. Called nurse to check if there were any missed measurements overnight she was aware of; she reports he has a condom catheter and she did not receive any sign out regarding missed measurements. Nurse checked foley bag at time of call and confirmed only 50-133mL of clear urine in the bag.   - Palliativeconsulted, appreciate recommendations - Consider cardiology consult - Continuous cardiac monitoring, pulse ox - IV Lasix 80 mg BID - Monitor electrolytes with daily BMP, keep K>4, Mg>2 - Continue home Imdur 60 mg daily  - Continue Metoprolol 12.5 mg daily  - Strict I/O, daily weights - Up with assistance - PT/OT eval and treat -AM BMP added on previous collection, will follow up  DVT s/p IVC, acute  CAD Ischemic Cardiomyopathy Hx MI  Hx AAA s/p repair  Left lower extremity with extensive DVT involving the common  femoral vein, SFJ, posterior tibial veins and peroneal vein. Right lower extremity DVT in the tibial vessels.Patient with IVC filter placed 11/20. Bilateral superficial femoral artery occlusion seen on venous duplex. Patient with edematous and cold extremities on exam, unable to appreciate pulses. 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  Awaiting a.m. creatinine.  Questionable urine output overnight.  Net output last 24 hours +59.7 mL, with only 300 mL of urine documented from the condom catheter.  Spoke with nurse as documented above in primary problem.  We will follow up urine output during the day, however continued poor urine output in setting of IV Lasix 80 mg twice daily may indicate worsening of CKD.  - Avoid nephrotoxic drugs as able - Monitor with daily BMP -Follow-up urine output midday - Follow up renal ultrasound - Nephro consulted, appreciate recs - F/u ammonia level  Thrombocytopenia: chronic Platelets 96 this morning, stable from 106 on admission.Per chart reviews, patient has been thrombocytopenic since last admission with platelets around 115-140. Last year, was 330-480. Discussed with pharm, unlikely HIIT. Pharm recommends continuing heparin due to severe DVTs of BLE unless PLT drops below 50.  - Monitor with daily CBC - Consider outpatient work-up  Hx GI bleed  GERD  Hx Stomach Ulcer Home medications notable for Famotidine 40 mg,carafate 1mg , ferrous sulfate 325 mg,Protonix 40 mg BID.  - Continue  home Pepcid, iron and protonix   COPD: chronic, stable Home medication includes albuterol PRN.  - Albuterol PRN  Hyperlipidemia: chronic, stable On Atorvastatin 20 mg daily at  home.Unsure of last lipid panel. - Continue home atorvastatin - Lipid panel scheduled for tomorrow morning  Gout: chronic, stable - Continue home allopurinol 300 mg daily  Tobacco Abuse: Current smoker, has cut down significantly. Previously smoked 3-4 ppd, now only 3 cigarettes/day. Has smoked since he was a teenager. - Nicotine patchPRN - Encourage smoking cessation  Acute Grief Reaction Patient notes recent passing of his son and his wife. Is intermittently tearful on examination when speaking about this. Lives alone, admits to being lonely. Patient is DNR/DNI and states "I want to be with my wife". Not on any medications for depression. Due to his chronic medical conditions, discussion about palliative care has been brought up in the past.  - Monitor mood - Consider palliative consult - Consider SSRI outpatient  FEN/GI:Heart healthy Prophylaxis:SCD's   Status is: Inpatient  Remains inpatient appropriate because:Persistent severe electrolyte disturbances, Ongoing diagnostic testing needed not appropriate for outpatient work up and Unsafe d/c plan   Dispo: The patient is from: Home              Anticipated d/c is to: SNF              Anticipated d/c date is: 2 days              Patient currently is not medically stable to d/c.    Subjective:  Patient found laying in his bed this morning resting comfortably.  Will awake to verbal stimuli, however appears confused.  Patient is legally blind but at his last admission could still locate speaker based on voice.  Subjectively I am quite concerned about this patient's state this admission.  I was the admitting physician for his previous hospital admission and he was much more alert and conversational at that time.  Today, he is intermittently confused and dozes off easily midsentence.  During his last admission, he declined vascular surgery invasive imaging because he did not want invasive diagnostics or surgery. However,  today when I asked if he wanted to speak with vascular surgery regarding imaging, he said "sure".  Objective: Temp:  [97.5 F (36.4 C)-97.8 F (36.6 C)] 97.5 F (36.4 C) (12/24 0511) Pulse Rate:  [93-96] 93 (12/24 0511) Resp:  [15-18] 15 (12/24 0511) BP: (128-139)/(72-92) 139/86 (12/24 0511) SpO2:  [96 %-100 %] 96 % (12/24 0511) Weight:  [69.8 kg-70.2 kg] 70.2 kg (12/24 0511)  Physical Exam: General: lightly asleep without distress, easily awoken to voice, easily confused, falls asleep midsentence Cardiovascular: Regular rate and rhythm, no murmurs appreciated Respiratory: Clear to auscultation bilaterally, limited by poor air movement and limited effort secondary to confusion Abdomen: Soft, nondistended, nontender Extremities: No BLE edema at ankle or calf, bilateral feet are thickened and discolored, purplish, toenails thickened, hyperkeratotic skin  Laboratory: Recent Labs  Lab 05/30/20 2141 06/01/20 0120 06/02/20 0346  WBC 14.3* 12.5* 18.9*  HGB 14.9 14.5 14.9  HCT 45.7 42.1 43.3  PLT 106* 103* 96*   Recent Labs  Lab 05/30/20 2141 06/01/20 0120  NA 130* 134*  K 4.7 4.7  CL 98 98  CO2 16* 21*  BUN 94* 94*  CREATININE 2.64* 2.63*  CALCIUM 9.3 9.3  GLUCOSE 137* 153*    Imaging/Diagnostic Tests: CT HEAD WITHOUT CONTRAST 06/01/2020 IMPRESSION: No acute intracranial abnormality. Stable cerebral atrophy, chronic small vessel disease, and  old bilateral basal ganglia lacunar infarcts.    Ezequiel Essex, MD 06/02/2020, 8:28 AM PGY-1, [place] Intern pager: [phone], text pages welcome

## 2020-06-02 NOTE — Progress Notes (Signed)
[place] for Heparin Indication: 12/22 BL LE DVT  Allergies  Allergen Reactions  . Lisinopril Cough    Pt reports significant cough  . Losartan Other (See Comments)    Pt reported cough and chest pain    Patient Measurements: Wt 72.2kg Ht 5'5" Heparin Dosing Weight: 72.2kg  Vital Signs: Temp: 97.8 F (36.6 C) (12/23 1654) Temp Source: Oral (12/23 1654) BP: 128/72 (12/23 2215) Pulse Rate: 96 (12/23 2215)  Labs: Recent Labs    05/30/20 2141 05/31/20 1738 06/01/20 0120 06/01/20 1005 06/02/20 0346  HGB 14.9  --  14.5  --  14.9  HCT 45.7  --  42.1  --  43.3  PLT 106*  --  103*  --  96*  HEPARINUNFRC  --    < > 0.19* 0.32 0.20*  CREATININE 2.64*  --  2.63*  --   --    < > = values in this interval not displayed.    Estimated Creatinine Clearance: 20.5 mL/min (A) (by C-G formula based on SCr of 2.63 mg/dL (H)).  Assessment: 79 yo M with acute BLLE DVT 05/31/20. Pharmacy consulted to start heparin given renal function. Of note, patient with hx of GIB and PUD in the setting of using a lot of [place] Powder, requiring 4 units RBC. 02/28/19 EGD found multiple gastric and duodenal ulcers. He was admitted to [facility] 9/21-10/1/20 and developed LLE DVT, for which he was discharged on apixaban. Unfortunately, he had another GIB 04/13/2019 while on apixaban and received an IVC filter. He is not on any AC PTA, unclear when apixaban was stopped.  Heparin level subtherapeutic (0.2) on gtt at 1400 units/hr. No issues with line or bleeding reported per RN.  Goal of Therapy:  Heparin level 0.3-0.5 units/ml (lower goal for hx multiple severe GIB) Monitor platelets by anticoagulation protocol: Yes   Plan:  Increase heparin to 1500 units/hr  F/u 8 hr heparin level  Sherlon Handing, PharmD, BCPS Please see amion for complete clinical pharmacist phone list 06/02/2020 4:33 AM

## 2020-06-02 NOTE — Progress Notes (Signed)
°  Progress Note    06/02/2020 12:13 PM   Subjective: Complaining of pain in both feet  Vitals:   06/02/20 0920 06/02/20 1208  BP: (!) 133/93 135/90  Pulse: 89 88  Resp:  16  Temp:  97.8 F (36.6 C)  SpO2:  94%    Physical Exam: Awake alert Abdomen is soft Bilateral blistering dorsum of both feet with purple discoloration of toes No palpable distal pulses feet do not appear to be acutely ischemic  CBC    Component Value Date/Time   WBC 18.9 (H) 06/02/2020 0346   RBC 4.84 06/02/2020 0346   HGB 14.9 06/02/2020 0346   HGB 15.3 04/26/2020 1055   HCT 43.3 06/02/2020 0346   HCT 44.9 04/26/2020 1055   PLT 96 (L) 06/02/2020 0346   PLT 140 (L) 04/26/2020 1055   MCV 89.5 06/02/2020 0346   MCV 90 04/26/2020 1055   MCH 30.8 06/02/2020 0346   MCHC 34.4 06/02/2020 0346   RDW 19.2 (H) 06/02/2020 0346   RDW 18.8 (H) 04/26/2020 1055   LYMPHSABS 1.3 04/30/2020 1222   MONOABS 0.8 04/30/2020 1222   EOSABS 0.0 04/30/2020 1222   BASOSABS 0.0 04/30/2020 1222    BMET    Component Value Date/Time   NA 133 (L) 06/02/2020 0926   NA 135 04/26/2020 1055   K 5.0 06/02/2020 0926   CL 98 06/02/2020 0926   CO2 18 (L) 06/02/2020 0926   GLUCOSE 117 (H) 06/02/2020 0926   BUN 110 (H) 06/02/2020 0926   BUN 57 (H) 04/26/2020 1055   CREATININE 3.15 (H) 06/02/2020 0926   CALCIUM 9.3 06/02/2020 0926   GFRNONAA 20 (L) 06/02/2020 0926   GFRAA 42 (L) 04/26/2020 1055    INR    Component Value Date/Time   INR 1.1 04/30/2020 1222     Intake/Output Summary (Last 24 hours) at 06/02/2020 1213 Last data filed at 06/02/2020 1144 Gross per 24 hour  Intake 314.63 ml  Output 355 ml  Net -40.37 ml     Assessment/plan:  79 y.o. 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  No need for vascular invention at this time we will be available as needed.    Richard Rolf C. Donzetta Matters, MD [facility] [place] Office: [phone] Pager: [phone]  06/02/2020 12:13 PM

## 2020-06-02 NOTE — Consult Note (Addendum)
Palliative Medicine Inpatient Consult Note  Reason for consult:  Goals of Care  HPI:  Per intake H&P --> Richard Townsend a 79 y.o.malepresenting with worsening lower extremity edema, found to have DVT and PAD. 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  Palliative care was consulted to aid in goals of care conversations in the setting of a progressive declining health state.  Clinical Assessment/Goals of Care: I have reviewed medical records including EPIC notes, labs and imaging, received report from bedside RN, assessed the patient who is quite disoriented he believes that we are in his hometown and in the month of July.I asked him if I had permission to reach out to his daughter Butch Penny.  He shares that I can.   I Tawanna Solo (daughter) and Lorrene Reid (son in law) Long  to further discuss diagnosis prognosis, GOC, EOL wishes, disposition and options.   I introduced Palliative Medicine as specialized medical care for people living with serious illness. It focuses on providing relief from the symptoms and stress of a serious illness. The goal is to improve quality of life for both the patient and the family.  Lula is from [place], [place]. He is a widower. He has two children Butch Penny and [place]. He has four grandchildren and three great grandchildren. Sadly Frederico Hamman took his own life in 2019. 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   Lupe had a degree of functionality prior to admission. He had been living on his own though his younger sister is his next door neighbor. His daughter and son in law would check in on him 2-3x daily. His son in law, [name] took him to all of his healthcare appointments.  A detailed discussion was had today regarding advanced directives - these had  not been completed though Butch Penny is his only living child and is his surrogate Holiday representative.    Concepts specific to code status, artifical feeding and hydration, continued IV antibiotics and rehospitalization was had. I completed a MOST form today. The patient and family outlined their wishes for the following treatment decisions:  Cardiopulmonary Resuscitation: Do Not Attempt Resuscitation (DNR/No CPR)  Medical Interventions: Comfort Measures: Keep clean, warm, and dry. Use medication by any route, positioning, wound care, and other measures to relieve pain and suffering. Use oxygen, suction and manual treatment of airway obstruction as needed for comfort. Do not transfer to the hospital unless comfort needs cannot be met in current location.  Antibiotics: Determine use of limitation of antibiotics when infection occurs  IV Fluids: No IV fluids (provide other measures to ensure comfort)  Feeding Tube: No feeding tube   The difference between a aggressive medical intervention path  and a palliative comfort care path for this patient at this time was had. [name] shares that since August 30 of this year when his wife passed away he has declined significantly.  He believes there is a component of sadness associated with the loss of his wife as Xander used to do "everything" for her.  [name] shares that for a period of time he and his wife Butch Penny tried to convince Garner to move in with him though because he knows his grounds so well he did not want to do that.  We talked about the reality that Chandlor has some pretty severe  congestive heart failure and peripheral arterial disease.  We discussed that unless there are close eyes on him and very strict management he is likely to continue coming into and out of the hospital.  [name] states that all of these points were brought to his attention back in November after a hospitalization and at that time hospice care was recommended.  Both Butch Penny and [name] have been  in communication with [facility] though they had not determined that they wanted to initiate their services until the beginning of January. .  I described hospice as a service for patients for have a life expectancy of < 40month. It preserves dignity and quality at the end phases of life. The focus changes from curative to symptom relief.  DButch Pennyand [name] both understand the concepts of hospice.  They do feel that this would likely best aligned with what TJaelenwould want for himself though at this point in time they are not able to assume the 24/7 care responsibility.  We talked about various options inclusive of TDallingoing to skilled nursing to optimize his strength.  I did share my concern that he may not fare well given his complicated delirium which they to agreed with.  We discussed him going home with hospice though the reality is he would need 24/7 care.  At this time DButch Pennyis still working and although RLorrene Reidis retired he is watching their grandchildren regularly.  I stated that I would asked the transitions of care team [facility] instance.  I did emphasize that inpatient hospice is typically preserved for patients who are truly at their end days to weeks of life.  We will continue to aid in aAuburn Lake Trailsand his family moving forward to to help in additional decision-making.  Discussed the importance of continued conversation with family and their  medical providers regarding overall plan of care and treatment options, ensuring decisions are within the context of the patients values and GOCs.  Decision Maker: DEusebio Friendly(daughter) [phone] SUMMARY OF RECOMMENDATIONS   DNAR/DNI  [facility]- Prior had HDardenne Prairiereach out to them - need to see what level of support they could provide. Patient is too stable to go to inpatient hospice as this time.   Ongoing  Palliative Support  Code Status/Advance Care Planning: DNAR/DNI   Symptom Management:  Delirium:   - Strict precautions                 - Get up during the day                 - Encourage a familiar face to remain present throughout the day                 - Keep blinds open and lights on during daylight hours                   - Minimize the use of opioids/benzodiazepines    - Initiate buspar 534mPO BID   Palliative Prophylaxis:   Oral Care, Delirium precautions, Mobility  Additional Recommendations (Limitations, Scope, Preferences):  Treat what is treatable  Psycho-social/Spiritual:   Desire for further Chaplaincy support: Yes - Chrisitian  Additional Recommendations: Education on hospice care   Prognosis: Poor in the setting of chronic co-morbidities and complicated delirium  Discharge Planning: Unclear - will  likely need to be at a facility as care needs exceed families ability to care for him.  Vitals:   06/01/20 2215 06/02/20 0511  BP: 128/72 139/86  Pulse: 96 93  Resp: 16 15  Temp:  (!) 97.5 F (36.4 C)  SpO2: 99% 96%    Intake/Output Summary (Last 24 hours) at 06/02/2020 0701 Last data filed at 06/02/2020 0530 Gross per 24 hour  Intake 359.66 ml  Output 300 ml  Net 59.66 ml   Last Weight  Most recent update: 06/02/2020  5:29 AM   Weight  70.2 kg (154 lb 12.2 oz)           Gen:  Elderly M disoriented HEENT: moist mucous membranes CV: Regular rate and rhythm, no murmurs rubs or gallops PULM: clear to auscultation bilaterally  ABD: soft/nontender  EXT: No edema  Neuro: Disoriented to place, time, situation  PPS: 40-50%   This conversation/these recommendations were discussed with Family Medicine Team.   Time In: 0900 Time Out: 1010 Total Time: 70 Greater than 50%  of this time was spent counseling and coordinating care related to the above assessment and plan.  [place] Team Team Cell Phone:  [phone] Please utilize secure chat with additional questions, if there is no response within 30 minutes please call the above phone number  Palliative Medicine Team providers are available by phone from 7am to 7pm daily and can be reached through the team cell phone.  Should this patient require assistance outside of these hours, please call the patient's attending physician.

## 2020-06-02 NOTE — Progress Notes (Signed)
Spoke with Richard Townsend granddaughter Richard Townsend and grandson Richard Townsend at [phone]. I updated them on Richard Townsend developing condition and his worsening labs. Informed them his kidney function is looking worse and that we are planning to do a renal ultrasound. Richard Townsend confirmed that Richard Townsend is DNR/DNI status and said he was adamant about never being on hemodialysis. Richard Townsend had some questions regarding prognosis and the amount of time expected for Richard Townsend to have left. She reports that for Richard grandmother's death several months ago, she returned home on hospice and died 20 minutes later. Richard Townsend wanted to avoid this happening with Richard Townsend, Richard Townsend. After some discussion, Richard Townsend and Richard Townsend confirmed that they would definitely like to pursue hospice care for Richard Townsend. I told them I would relay that information to the social worker here at the hospital to get the ball rolling.   LCSW Richard Townsend wrote a note in Richard Townsend chart at 11am, but was no longer available on Epic. Called the 51M SW and obtained his phone number. I left a VM on Richard Townsend's phone line. Next attempted to contact case manager Richard Townsend but also reached Richard VM.   Ended up calling [facility] directly. Spoke with Richard Townsend at their call center. Gave them the family's name and phone number to contact directly to set up hospice care. She was confident someone would be reaching out by the end of the day, sooner rather than later.   Richard Essex, MD

## 2020-06-02 NOTE — Progress Notes (Signed)
CSW received message from Tacey Ruiz with Palliative that family is interested in Hospice in [place].  CSW spoke with Cherie at [facility] and she will review the case and reach out to the family. Lurline Idol, MSW, LCSW 12/24/202110:59 AM

## 2020-06-02 NOTE — Progress Notes (Signed)
FPTS Interim Progress Note  Spoke to pharmacy regarding slow decline in platelets given he is on Heparin for DVT treatment. They have low suspicion this is HIT given he has only been on heparin for 2 days and the decline has not been as expected. He has has heparin products in the past without issue. Upon further review, he has a history of platelets being in the 300-400's in 2020. Per chart review, does not appear he has history of liver disease or alcohol abuse.  Regarding heparin treatment, recommended to continue at this time. If platelets drop <50 threshold then transition to alternative medication.  Unclear etiology for increase WBC, decreasing RBC and platelets.  Blood cultures obtained given lower temp this AM.  Consider peripheral blood smear  Will obtain CBC w/ diff in AM, HIV  Danna Hefty, DO 06/02/2020, 1:42 PM PGY-3, [place] Medicine Service pager [phone]

## 2020-06-02 NOTE — Progress Notes (Signed)
[facility] for Heparin Indication: 12/22 BL LE DVT  Allergies  Allergen Reactions  . Lisinopril Cough    Pt reports significant cough  . Losartan Other (See Comments)    Pt reported cough and chest pain    Patient Measurements: Wt 72.2kg Ht 5'5" Heparin Dosing Weight: 72.2kg  Vital Signs: Temp: 97.8 F (36.6 C) (12/24 1208) Temp Source: Oral (12/24 1208) BP: 135/90 (12/24 1208) Pulse Rate: 88 (12/24 1208)  Labs: Recent Labs    05/30/20 2141 05/31/20 1738 06/01/20 0120 06/01/20 1005 06/02/20 0346 06/02/20 0926 06/02/20 1249  HGB 14.9  --  14.5  --  14.9  --   --   HCT 45.7  --  42.1  --  43.3  --   --   PLT 106*  --  103*  --  96*  --   --   HEPARINUNFRC  --    < > 0.19* 0.32 0.20*  --  0.34  CREATININE 2.64*  --  2.63*  --   --  3.15*  --    < > = values in this interval not displayed.    Estimated Creatinine Clearance: 17.1 mL/min (A) (by C-G formula based on SCr of 3.15 mg/dL (H)).  Assessment: 79 yo M with acute BLLE DVT 05/31/20. Pharmacy consulted to start heparin given renal function. Of note, patient with hx of GIB and PUD in the setting of using a lot of Goody Powder, requiring 4 units RBC. 02/28/19 EGD found multiple gastric and duodenal ulcers. He was admitted to [facility] 9/21-10/1/20 and developed LLE DVT, for which he was discharged on apixaban. Unfortunately, he had another GIB 04/13/2019 while on apixaban and received an IVC filter. He is not on any AC PTA, unclear when apixaban was stopped.  Heparin level therapeutic 0.34 on gtt at 1500 units/hr. No issues with line or bleeding reported per RN. Contacted by family medicine physician regarding concerning plt trend. Appears plts have been 140 at baseline over the past year (admitted in November and plts were 140). Will continue to monitor plts closely as well monitor for bleed.   Goal of Therapy:  Heparin level 0.3-0.5 units/ml (lower goal for hx multiple severe  GIB) Monitor platelets by anticoagulation protocol: Yes   Plan:  Continue heparin at 1500 units/hr  Confirm with 8-hour heparin level  Daily HL and CBC  Monitor plt and bleed   Cephus Slater, PharmD, [facility] Pharmacy Resident [phone] 06/02/2020 1:55 PM

## 2020-06-03 DIAGNOSIS — I509 Heart failure, unspecified: Secondary | ICD-10-CM

## 2020-06-03 MED ORDER — ATROPINE SULFATE 1 % OP SOLN: 2.0000 [drp] | Freq: Four times a day (QID) | OPHTHALMIC | 12 refills | Status: AC | PRN

## 2020-06-03 MED ORDER — LORAZEPAM 0.5 MG PO TABS: 0.5000 mg | ORAL_TABLET | ORAL | 1 refills | Status: AC | PRN

## 2020-06-03 MED ORDER — GABAPENTIN 300 MG PO CAPS
300.0000 mg | ORAL_CAPSULE | Freq: Every day | ORAL | Status: AC
Start: 2020-06-03 — End: ?

## 2020-06-03 MED ORDER — GLYCOPYRROLATE 0.2 MG/ML IJ SOLN: 0.4000 mg | INTRAMUSCULAR | Status: AC | PRN

## 2020-06-03 MED ORDER — OXYCODONE HCL 5 MG PO TABS: 5.0000 mg | ORAL_TABLET | Freq: Four times a day (QID) | ORAL | 0 refills | Status: AC | PRN

## 2020-06-03 MED ORDER — PANTOPRAZOLE SODIUM 40 MG PO TBEC: 40.0000 mg | DELAYED_RELEASE_TABLET | Freq: Two times a day (BID) | ORAL | Status: AC

## 2020-06-03 MED ORDER — ALLOPURINOL 100 MG PO TABS: 200.0000 mg | ORAL_TABLET | Freq: Every day | ORAL | Status: AC

## 2020-06-03 MED ORDER — MORPHINE SULFATE 20 MG/5ML PO SOLN: 5.0000 mg | ORAL | 0 refills | Status: AC | PRN

## 2020-06-03 MED ORDER — BISACODYL 10 MG RE SUPP
10.0000 mg | Freq: Every day | RECTAL | 0 refills | Status: AC | PRN
Start: 2020-06-03 — End: ?

## 2020-06-03 NOTE — Progress Notes (Signed)
   Palliative Medicine Inpatient Follow Up Note  Reason for consult:  Goals of Care  HPI:  Per intake H&P --> Kleber Crean Conradis a 79 y.o.malepresenting with worsening lower extremity edema, found to have DVT and PAD. 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  Palliative care was consulted to aid in goals of care conversations in the setting of a progressive declining health state.  Today's Discussion (06/03/2020): Chart reviewed.   Spoke to bedside nursing staff. They endorse that Yousef has been doing fairly well and has not required any as needed medications overnight. Caspian did have urinary retention overnight requiring placement of a Foley catheter.  Upon assessment of Yehudah he is more responsive this morning and able to clarify his name. He remains to be disoriented to location , situation, date, and time. He is noted to be calm this morning without distress.  Plan for patient's family to visit today - had endorsed to them that visitation policy is liberalized to three family members at a time in rotation.  Questions and concerns addressed   Objective Assessment: Vital Signs Vitals:   06/02/20 1208 06/03/20 0006  BP: 135/90 130/88  Pulse: 88 87  Resp: 16 16  Temp: 97.8 F (36.6 C) 98.8 F (37.1 C)  SpO2: 94% 94%    Intake/Output Summary (Last 24 hours) at 06/03/2020 2993 Last data filed at 06/03/2020 0541 Gross per 24 hour  Intake 60 ml  Output 535 ml  Net -475 ml   Last Weight  Most recent update: 06/02/2020  5:29 AM   Weight  70.2 kg (154 lb 12.2 oz)           Gen:  Elderly M disoriented HEENT: moist mucous membranes CV: Regular rate and rhythm, no murmurs rubs or gallops PULM: clear to auscultation bilaterally  ABD: soft/nontender  EXT: No edema  Neuro: Oriented to self though disoriented to place, time, situation  SUMMARY OF  RECOMMENDATIONS DNAR/DNI  E-MOST completed and can be found in [facility]  Change to comfort care  Liberalize visitation  Medication per [facility]   Patients family would like patient to transition to [place] home  Ongoing Palliative Support  Time Spent: 15 Greater than 50% of the time was spent in counseling and coordination of care ______________________________________________________________________________________ [place] Team Team Cell Phone: [phone] Please utilize secure chat with additional questions, if there is no response within 30 minutes please call the above phone number  Palliative Medicine Team providers are available by phone from 7am to 7pm daily and can be reached through the team cell phone.  Should this patient require assistance outside of these hours, please call the patient's attending physician.

## 2020-06-03 NOTE — Progress Notes (Signed)
Family Medicine Teaching Service Daily Progress Note Intern Pager: [phone]  Patient name: Richard Townsend Medical record number: [id number] Date of birth: 1940-08-17 Age: 79 y.o. Gender: male  Primary Care Provider: Ernestene Kiel, MD Consultants: Vascular Surgery Code Status: Full   Pt Overview and Major Events to Date:  Richard Townsend a 79 y.o.maleis hospitalized for comfort care  2/2 acute on chronic HFrEF and acute DVT. 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  Assessment and Plan:  Comfort care  Poor prognosis 2/2  Acute on ChronicHFrEF Acute DVT s/p IVC Patient made comfort care yesterday after discussions with son-in-law and daughter due to patient's poor prognosis and his wishes. Pt was laying in bed and comfortable today. Reported that he was thirsty so I gave him a drink. He denied pain.  -Comfort care only  -DNR/DNI -Transfer to [place] hospice home when possible  -Palliative following, appreciate recommendations -Liberalize visitation from family  -No labs draws, frequent vital signs, heparin etc  -Dilaudid 0.5 to 1 mg every 1 hour as needed -Lorazepam 0.5 to 1 mg every 1 hour as needed -Oxycodone 5 mg every 6 hours as needed -Glycopyrrolate 0.4 mg every 4 hours as needed -Dulcolax suppository 10 mg daily as needed -Tylenol 650 every 6 hours as needed -Monitor UOP due to concerns for urinary retention   FEN/GI:Regular diet  Prophylaxis: None   Status is: Inpatient  Remains inpatient appropriate because:IV treatments appropriate due to intensity of illness or inability to take PO and Inpatient level of care appropriate due to severity of illness   Dispo: The patient is from: Home              Anticipated d/c is to: hospice               Anticipated d/c date is: > 3 days              Patient currently is not medically stable to d/c.   Subjective:  No acute  events overnight. Patient said he was thirsty this morning so I gave him sips of water through a straw. He denies any discomfort.   Objective: Temp:  [97.8 F (36.6 C)-98.8 F (37.1 C)] 98.8 F (37.1 C) (12/25 0006) Pulse Rate:  [87-89] 87 (12/25 0006) Resp:  [16] 16 (12/25 0006) BP: (130-135)/(88-93) 130/88 (12/25 0006) SpO2:  [94 %] 94 % (12/25 0006)  Physical Exam: General: unwell appearing 79 year old male, laying comfortably in the bed Cardiovascular: S1 and S2 present, RRR  Respiratory: CTAB, normal WOB  Abdomen: soft non tender, bowel sounds present  Extremities: no peripheral edema  Laboratory: Recent Labs  Lab 05/30/20 2141 06/01/20 0120 06/02/20 0346  WBC 14.3* 12.5* 18.9*  HGB 14.9 14.5 14.9  HCT 45.7 42.1 43.3  PLT 106* 103* 96*   Recent Labs  Lab 05/30/20 2141 06/01/20 0120 06/02/20 0926  NA 130* 134* 133*  K 4.7 4.7 5.0  CL 98 98 98  CO2 16* 21* 18*  BUN 94* 94* 110*  CREATININE 2.64* 2.63* 3.15*  CALCIUM 9.3 9.3 9.3  GLUCOSE 137* 153* 117*     Imaging/Diagnostic Tests: CT HEAD WO CONTRAST  Result Date: 06/01/2020 CLINICAL DATA:  Altered mental status. EXAM: CT HEAD WITHOUT CONTRAST TECHNIQUE: Contiguous axial images were obtained from the base of the skull through the vertex without intravenous contrast. COMPARISON:  [facility]/28/[facility] FINDINGS: Brain: No evidence of  acute infarction, hemorrhage, hydrocephalus, extra-axial collection, or mass lesion/mass effect. Mild-to-moderate diffuse cerebral atrophy is again seen. Mild chronic small vessel disease is also stable. 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 Vascular:  No hyperdense vessel or other acute findings. Skull: No evidence of fracture or other significant bone abnormality. Sinuses/Orbits:  No acute findings. Other: None. IMPRESSION: No acute intracranial abnormality. Stable cerebral atrophy, chronic small vessel disease, and old bilateral basal ganglia lacunar  infarcts. Electronically Signed   By: Marlaine Hind M.D.   On: 06/01/2020 14:19   US RENAL  Result Date: 06/02/2020 CLINICAL DATA:  Acute renal insufficiency.  Decreased urine output. EXAM: RENAL / URINARY TRACT ULTRASOUND COMPLETE COMPARISON:  CT 02/28/2019 FINDINGS: Right Kidney: Renal measurements: 10.7 x 5.2 x 6.0 cm = volume: 77 mL. Echogenicity within normal limits. No mass or hydronephrosis visualized. 8 mm shadowing echogenic focus over the upper pole likely a stone. Left Kidney: Not visualized.  Atrophic by previous CT. Bladder: Appears normal for degree of bladder distention. Other: None. IMPRESSION: Normal size right kidney with suggestion of 8 mm nonobstructing upper pole stone. Left kidney not visualized. Electronically Signed   By: Marin Olp M.D.   On: 06/02/2020 15:05    Lattie Haw, MD 06/03/2020, 8:51 AM PGY-, [place] Intern pager: [phone], text pages welcome

## 2020-06-03 NOTE — TOC Progression Note (Addendum)
Transition of Care [facility]) - Progression Note    Patient Details  Name: Richard Townsend MRN: [id number] Date of Birth: [birth year]  Transition of Care [facility]) CM/SW [place], [place] Phone Number: 06/03/2020, 10:53 AM  Clinical Narrative:    CSW received confirmation from [place] that the [facility] is able to receive patient today for admission. CSW will contact [facility] for transportation.  RN report number: [id number]  11:15a: CSW contacted [facility] for transportation. CSW left HIPPA-compliant voicemail with daughter informing of transfer. CSW left DNR form and discharge packet on patient's paper chart on unit.        Expected Discharge Plan and Services                                                 Social Determinants of Health (SDOH) Interventions    Readmission Risk Interventions No flowsheet data found.

## 2020-06-03 NOTE — Discharge Summary (Signed)
[facility] Discharge Summary  Patient name: TRESTIN MORELAN Medical record number: [id number] Date of birth: [birth year] Age: 79 y.o. Gender: male Date of Admission: 05/30/2020  Date of Discharge: 12/25 Admitting Physician: Carollee Leitz, MD  Primary Care Provider: Ernestene Kiel, MD Consultants: Palliative  Indication for Hospitalization: shortness of breath   Discharge Diagnoses/Problem List:  HFrEF HTN CAD MI AAA s/p repair GI bleed DVT s/p IVC filter 04/2019 Hyperlipidemia.   Disposition: [facility]   Discharge Condition: Medically stable for discharge  Discharge Exam:   General: unwell appearing 79 year old male, laying comfortably in the bed Cardiovascular: S1 and S2 present, RRR  Respiratory: CTAB, normal WOB  Abdomen: soft non tender, bowel sounds present  Extremities: no peripheral edema  Brief Hospital Course:  CHRISHUN KIMURA is a 79 y.o. male presenting with worsening lower extremity edema, found to have DVT and PAD. 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   Comfort care Palliative care were consulted early on during the admission in the setting of progressively declining health due to the medical problems listed below.  Mr Ueland was made comfort care on 12/24 after speaking with his daughter and son in law. Patient was discharged to [facility] on 12/25 via [facility]. Patient is DNR, DNI.  Acute on Chronic HFrEF (EF 30-35%) ED labs significant for BNP > 4,500. WBC increased to 14.3, hyponatremic to 130, bicarb 16. CXR with cardiomegaly, pulmonary congestion and interstitial edema, consistent with CHF exacerbation. Examination significant for 3+ pitting edema b/l feet to distal leg, with 1+ edema from mid leg to tibial tuberosities. Received 80 mg IV Lasix x1 in ED.   CAD  Ischemic Cardiomyopathy  Hx MI  Hx AAA s/p repair   DVT s/p IVC, acute  Left lower extremity with extensive DVT involving the common femoral vein, SFJ, posterior tibial veins and peroneal vein. Right lower extremity DVT in the tibial vessels.Patient with IVC filter placed 11/20. Patient was started on therapeutic heparin. Bilateral superficial femoral artery occlusion seen on venous duplex. VVS consulted and stated he would benefit from arteriogram to further evaluate arterial disease but because of his AKI on CKD recommended medical management prior to arteriogram      Issues for Follow Up:  1.Management per hospice   Significant Procedures:   Significant Labs and Imaging:  Recent Labs  Lab 05/30/20 2141 06/01/20 0120 06/02/20 0346  WBC 14.3* 12.5* 18.9*  HGB 14.9 14.5 14.9  HCT 45.7 42.1 43.3  PLT 106* 103* 96*   Recent Labs  Lab 05/30/20 2141 06/01/20 0120 06/02/20 0926  NA 130* 134* 133*  K 4.7 4.7 5.0  CL 98 98 98  CO2 16* 21* 18*  GLUCOSE 137* 153* 117*  BUN 94* 94* 110*  CREATININE 2.64* 2.63* 3.15*  CALCIUM 9.3 9.3 9.3     Results/Tests Pending at Time of Discharge:   Discharge Medications:  Allergies as of 06/03/2020      Reactions   Lisinopril Cough   Pt reports significant cough   Losartan Other (See Comments)   Pt reported cough and chest pain      Medication List    STOP taking these medications   atorvastatin 40 MG tablet Commonly known as: LIPITOR   beta carotene w/minerals tablet   famotidine 40 MG tablet Commonly known as: PEPCID   ferrous sulfate 324 MG Tbec   furosemide  40 MG tablet Commonly known as: LASIX   HYDROcodone-acetaminophen 5-325 MG tablet Commonly known as: NORCO/VICODIN   nitroGLYCERIN 0.4 MG SL tablet Commonly known as: NITROSTAT   potassium chloride 10 MEQ tablet Commonly known as: KLOR-CON   sucralfate 1 g tablet Commonly known as: CARAFATE   Symbicort 160-4.5 MCG/ACT inhaler Generic drug: budesonide-formoterol     TAKE these medications    acetaminophen 325 MG tablet Commonly known as: TYLENOL Take 650 mg by mouth every 6 (six) hours as needed for mild pain.   albuterol 108 (90 Base) MCG/ACT inhaler Commonly known as: VENTOLIN HFA Inhale 2 puffs into the lungs daily as needed for wheezing or shortness of breath.   allopurinol 100 MG tablet Commonly known as: ZYLOPRIM Take 2 tablets (200 mg total) by mouth daily. Start taking on: June 04, 2020 What changed:   medication strength  how much to take   bisacodyl 10 MG suppository Commonly known as: DULCOLAX Place 1 suppository (10 mg total) rectally daily as needed (Constipation).   gabapentin 300 MG capsule Commonly known as: NEURONTIN Take 1 capsule (300 mg total) by mouth at bedtime. What changed:   when to take this  reasons to take this   glycopyrrolate 0.2 MG/ML injection Commonly known as: ROBINUL Inject 2 mLs (0.4 mg total) into the vein every 4 (four) hours as needed (secretions).   isosorbide mononitrate 60 MG 24 hr tablet Commonly known as: IMDUR TAKE 1 TABLET(60 MG) BY MOUTH DAILY What changed: See the new instructions.   metoprolol succinate 25 MG 24 hr tablet Commonly known as: TOPROL-XL Take 0.5 tablets (12.5 mg total) by mouth daily.   oxyCODONE 5 MG immediate release tablet Commonly known as: Oxy IR/ROXICODONE Take 1 tablet (5 mg total) by mouth every 6 (six) hours as needed for severe pain.   pantoprazole 40 MG tablet Commonly known as: PROTONIX Take 1 tablet (40 mg total) by mouth 2 (two) times daily. What changed: when to take this       Discharge Instructions: Please refer to Patient Instructions section of EMR for full details.  Patient was counseled important signs and symptoms that should prompt return to medical care, changes in medications, dietary instructions, activity restrictions, and follow up appointments.   Follow-Up Appointments:   Lattie Haw, MD 06/03/2020, 11:29 AM PGY-, [place]

## 2020-06-03 NOTE — Progress Notes (Signed)
Pt being d/c to [facility]. Report given, hospice nurse asked that we do not remove pt's IV's. Pt has all belongings and will be transported by [facility].

## 2020-06-03 NOTE — Discharge Instructions (Signed)
Mr Deverick, Pruss were admitted to hospital because of your heart failure and clot in your legs. Because you have been very unwell over the past few months, we discussed with your daughter and son in law and the decision was made to keep you as comfortable as possible and not to treat anything actively. You are being discharged to [facility] where they will keep you as comfortable as possible.  We wish you and your family the most peaceful Merry Christmas.  Dr Posey Pronto

## 2020-06-04 ENCOUNTER — Other Ambulatory Visit: Payer: Self-pay | Admitting: Family Medicine

## 2020-06-04 NOTE — Telephone Encounter (Signed)
Per 12/25 DC summary.  Comfort care Palliative care were consulted early on during the admission in the setting of progressively declining health due to the medical problems listed below.  Richard Townsend was made comfort care on 12/24 after speaking with his daughter and son in law. Patient was discharged to [facility] on 12/25 via [facility]. Patient is DNR, DNI.  Appointment with Structural Heart team does not need to be rescheduled.

## 2020-06-05 ENCOUNTER — Other Ambulatory Visit: Payer: Self-pay

## 2020-06-06 ENCOUNTER — Institutional Professional Consult (permissible substitution): Payer: Medicare HMO | Admitting: Cardiovascular Disease

## 2020-06-07 ENCOUNTER — Ambulatory Visit: Payer: Self-pay

## 2020-06-07 LAB — CULTURE, BLOOD (ROUTINE X 2)
Culture: NO GROWTH
Culture: NO GROWTH
Special Requests: ADEQUATE
Special Requests: ADEQUATE

## 2020-06-10 NOTE — Patient Outreach (Signed)
[facility]) Care Management  06-12-20  Richard Townsend 11-Jan-1941 [id number]   Case Closure    Patient admitted to hospital on 05/30/20 and discharged to [facility] on 06/03/20. [facility] does not follow hospice patients.   Plan: RN CM will close case.  Antionette Fairy, RN,BSN,CCM [facility] Care Management Telephonic Care Management Coordinator Direct Phone: [phone] Toll Free: [phone] Fax: [phone]

## 2020-06-10 DEATH — deceased

## 2020-07-17 ENCOUNTER — Institutional Professional Consult (permissible substitution): Payer: Medicare HMO | Admitting: Cardiology

## 2020-07-19 ENCOUNTER — Ambulatory Visit: Payer: Medicare HMO | Admitting: Cardiology

## 2022-08-27 IMAGING — DX DG CHEST 2V
2 series · 3 of 3 positions shown · non-contrast
Comparison: 09/08/2019

CLINICAL DATA: Shortness of breath and lower extremity edema 1
week.

EXAM:
CHEST - 2 VIEW

[chest lat]
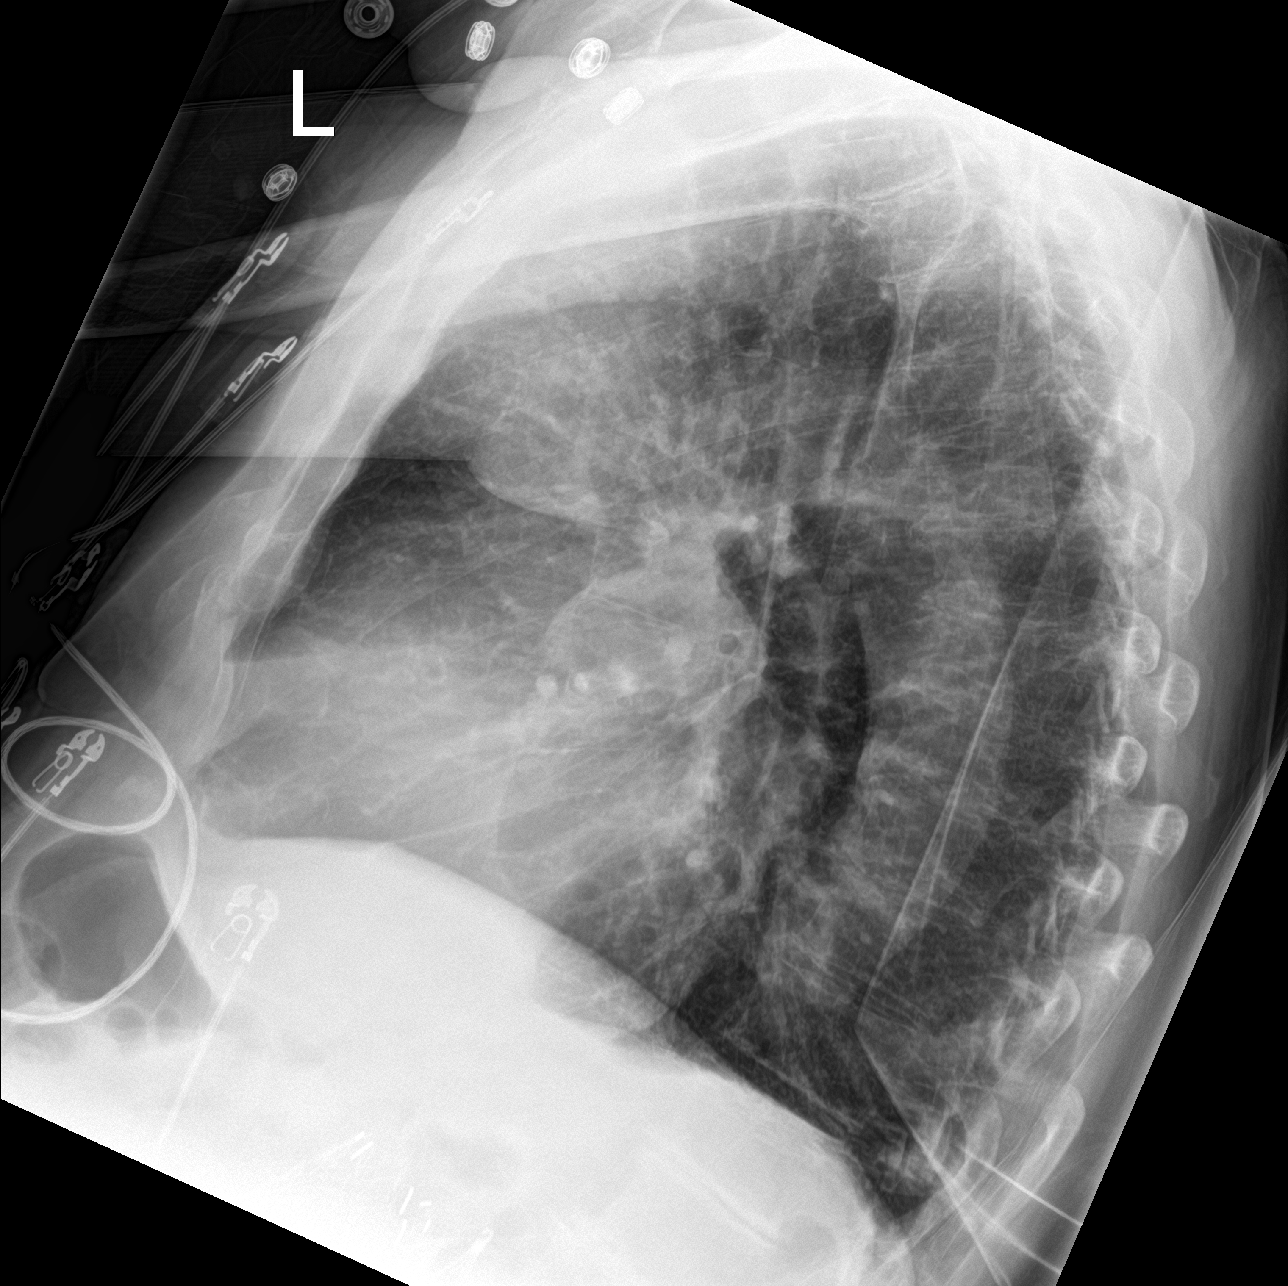

[Series 3: chest ap · 0.14mm/px · 2 of 2 slices shown]
[im 1/2]
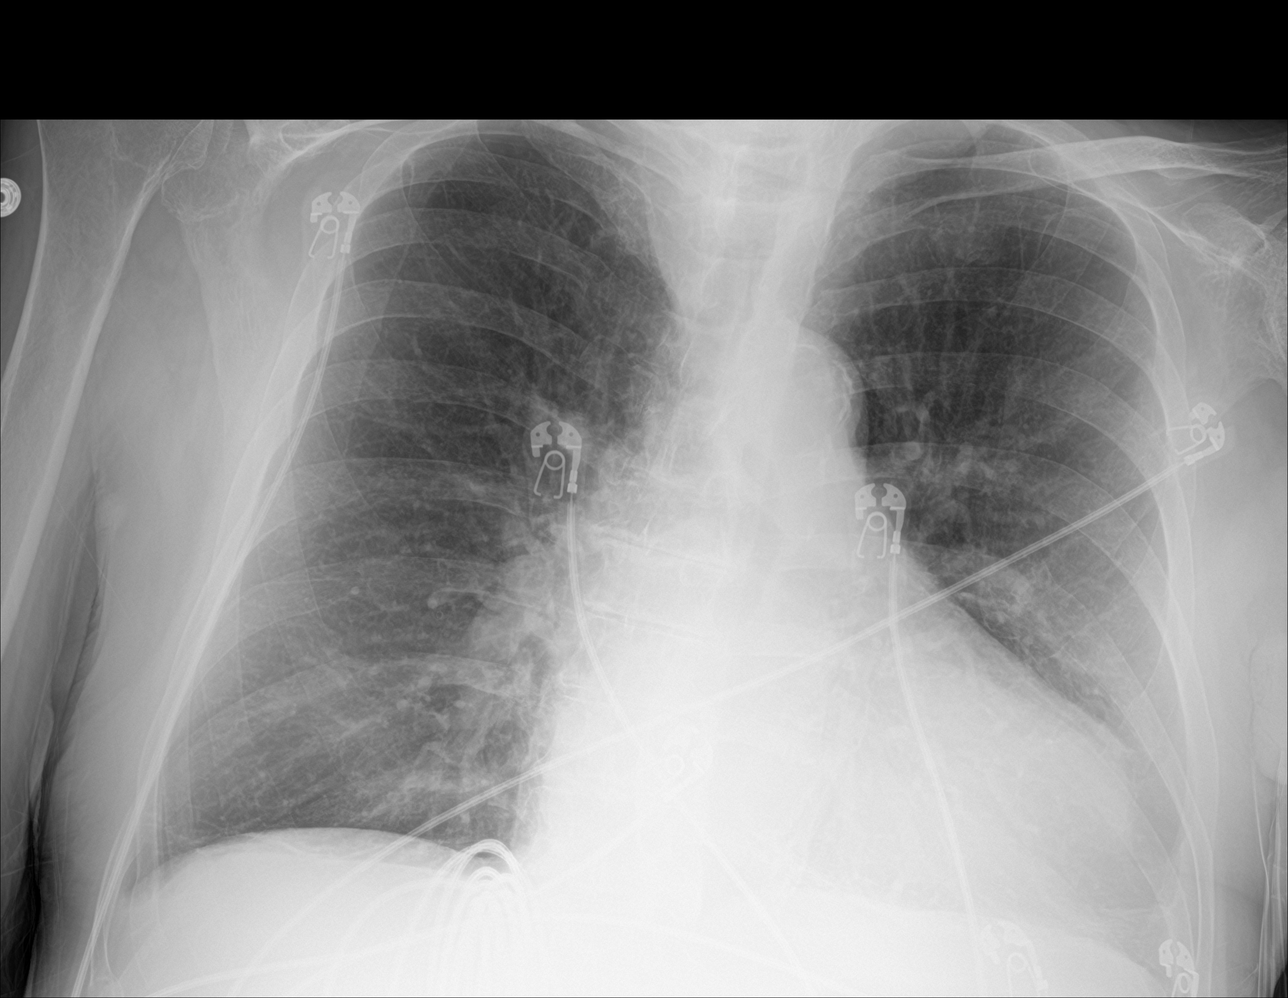
[im 2/2]
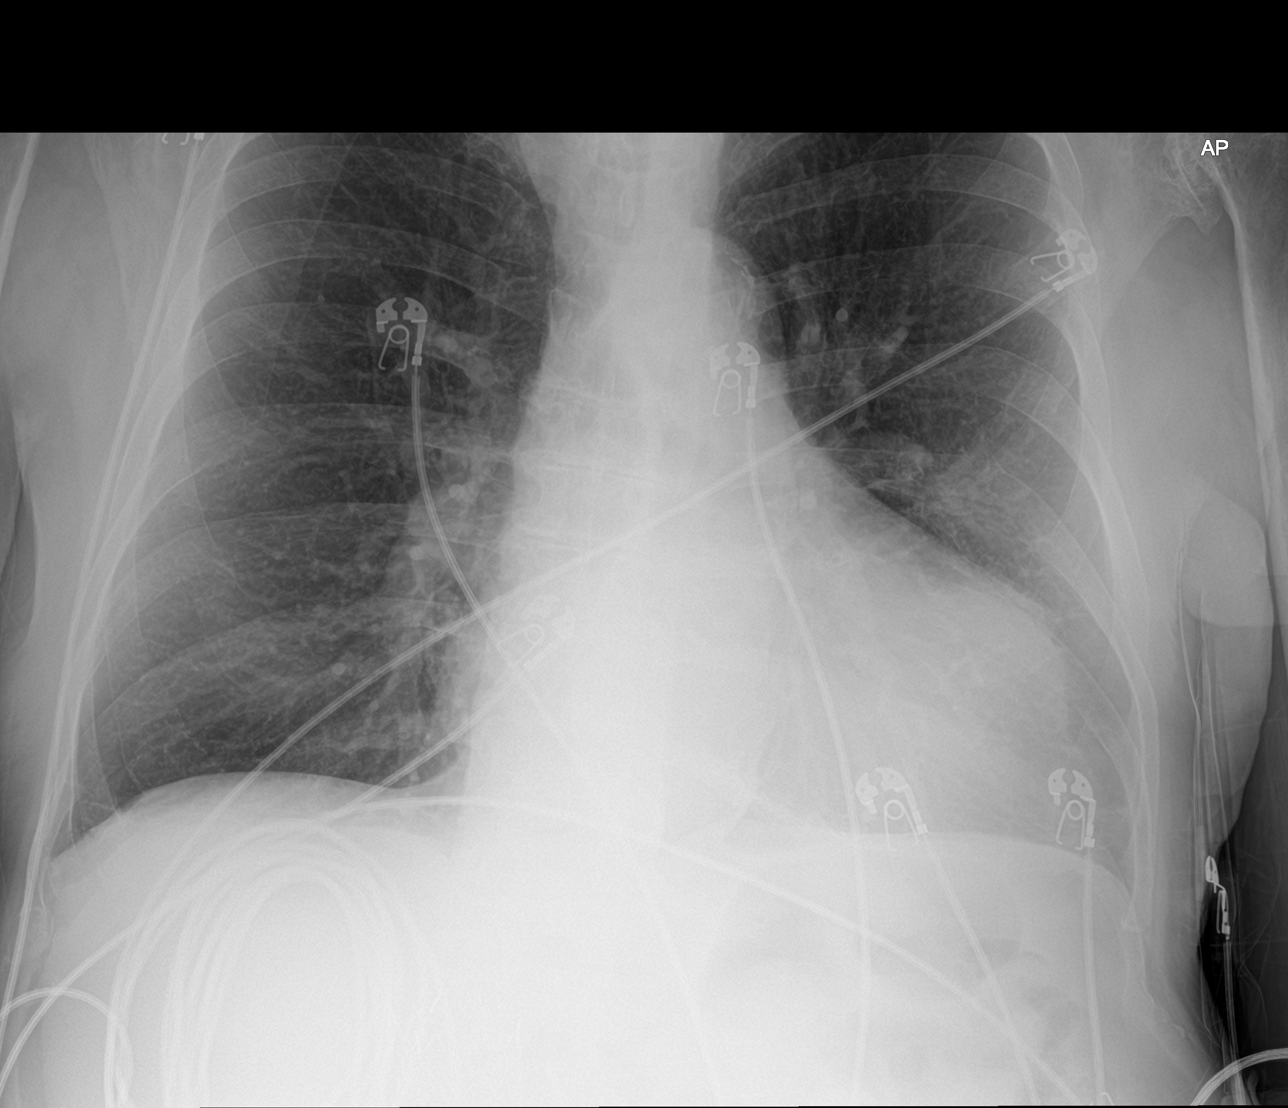

[3 of 3 positions shown; findings below may reference images not displayed]

FINDINGS: Lungs are adequately inflated with hazy prominence of the perihilar
vessels suggesting minimal vascular congestion. No significant
effusion. Mild stable cardiomegaly. Remainder the exam is unchanged.
IMPRESSION: Mild cardiomegaly with suggestion of minimal vascular congestion.

## 2022-09-28 IMAGING — CT CT HEAD W/O CM
4 series · 15 of 47 positions shown, 17 images · non-contrast
Comparison: 02/28/2019 from [facility]

CLINICAL DATA: Altered mental status.

EXAM:
CT HEAD WITHOUT CONTRAST
TECHNIQUE: Contiguous axial images were obtained from the base of the skull
through the vertex without intravenous contrast.

[Series 3: head wo · axial · 0.49mm/px · z∈[-228,-102]mm · 7 of 35 slices shown, 9 images]
[im 5/35  brain]
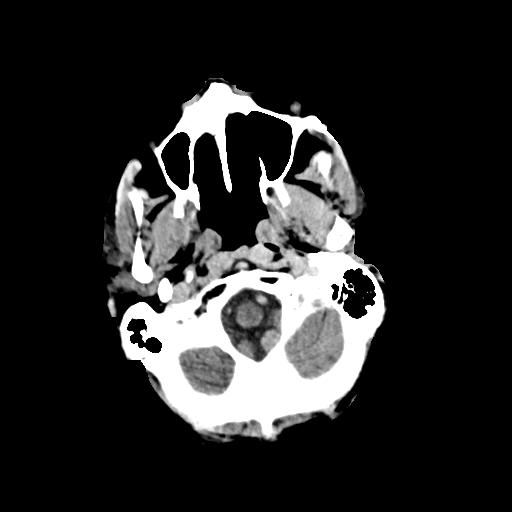
[im 5/35  bone]
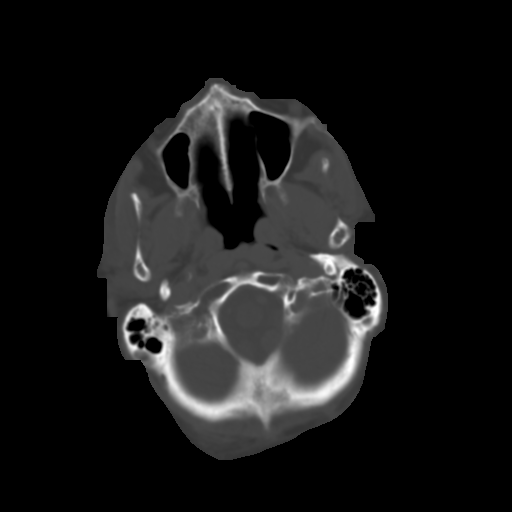
[im 9/35  brain]
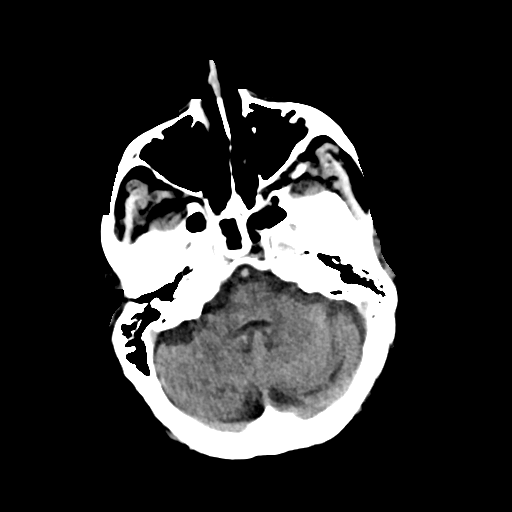
[im 13/35  brain]
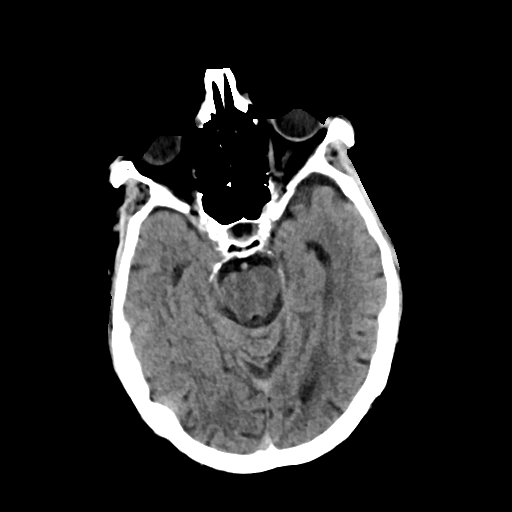
[im 18/35  brain]
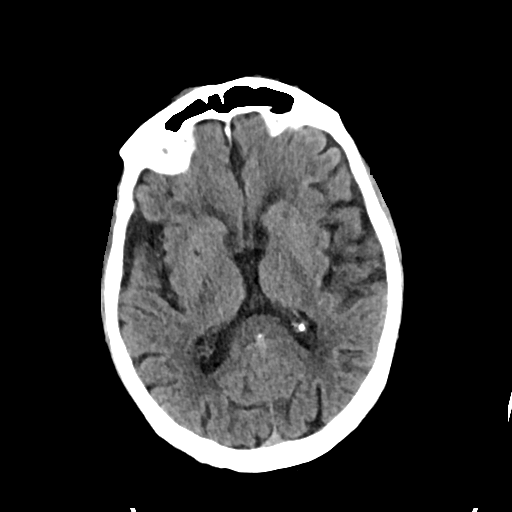
[im 22/35  brain]
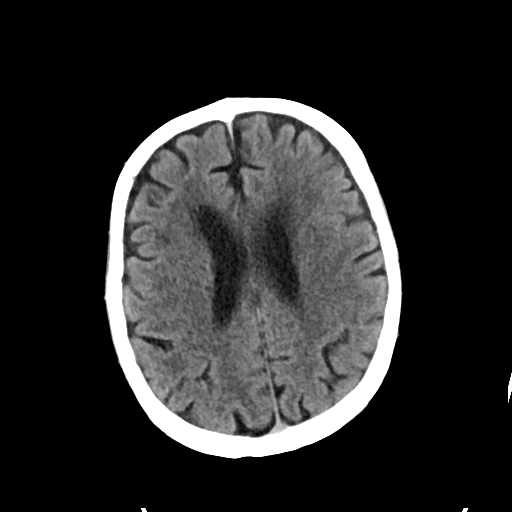
[im 22/35  bone]
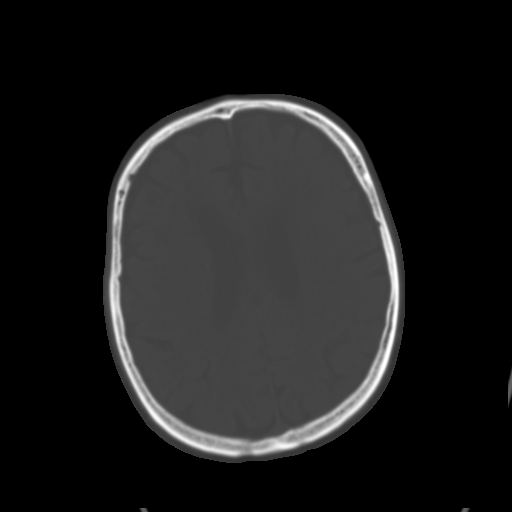
[im 26/35  brain]
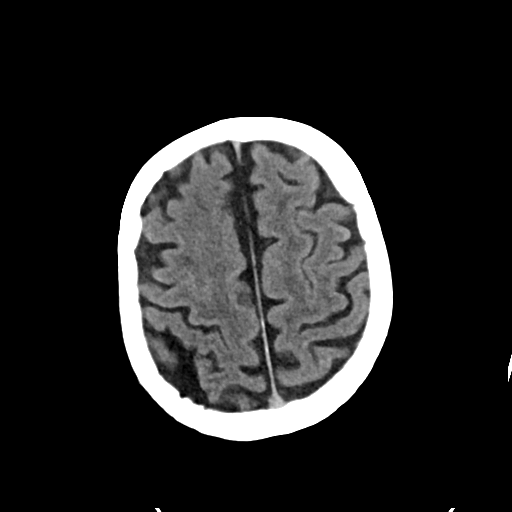
[im 30/35  brain]
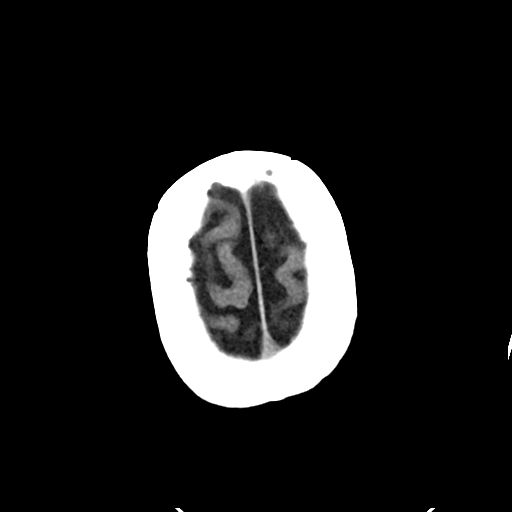

[Series 4: head bone · axial · 0.49mm/px · z∈[-232,-214]mm · 2 of 88 slices shown]
[im 9/88  bone]
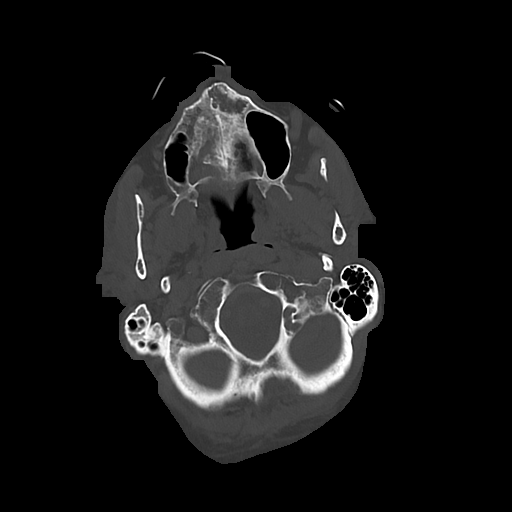
[im 18/88  bone]
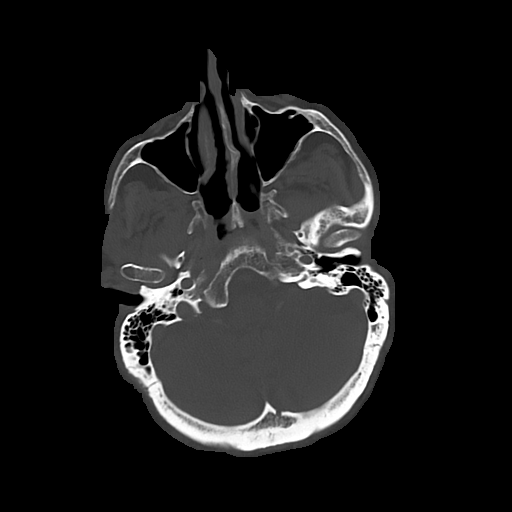

[Series 5: cor soft · coronal · 0.36mm/px · 3 of 100 slices shown]
[im 34/100  brain]
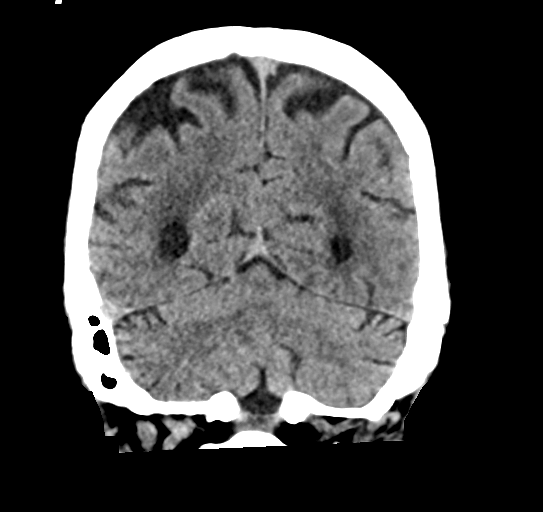
[im 45/100  brain]
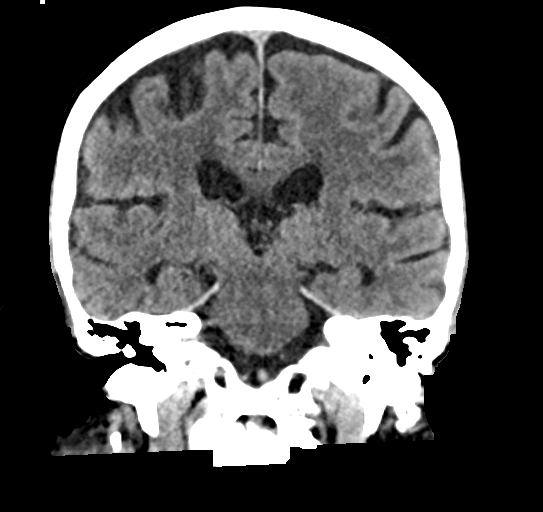
[im 56/100  brain]
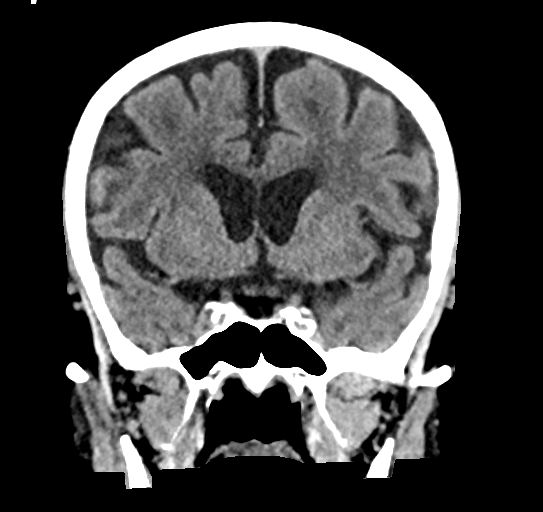

[Series 6: sag soft · sagittal · 0.36mm/px · 3 of 63 slices shown]
[im 21/63  brain]
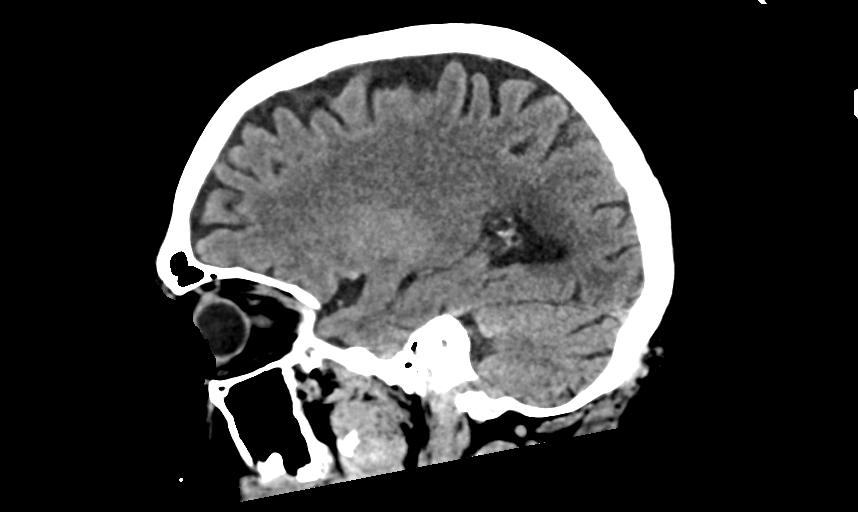
[im 32/63  brain]
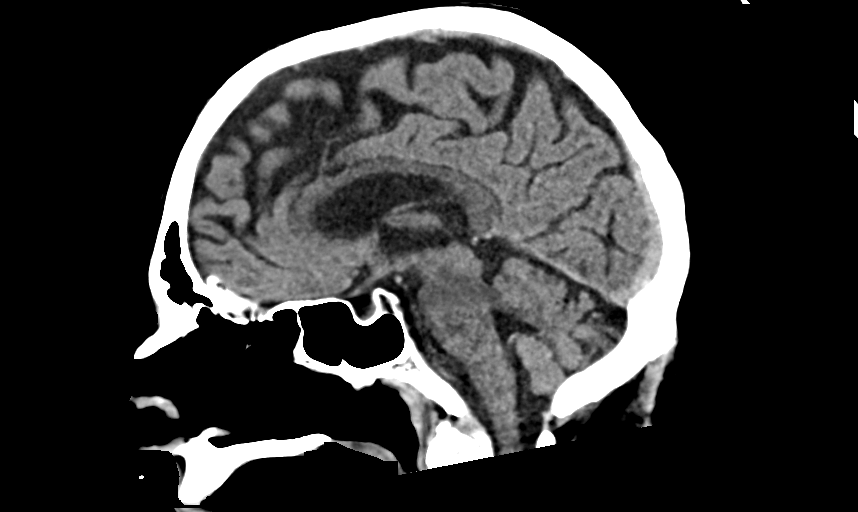
[im 42/63  brain]
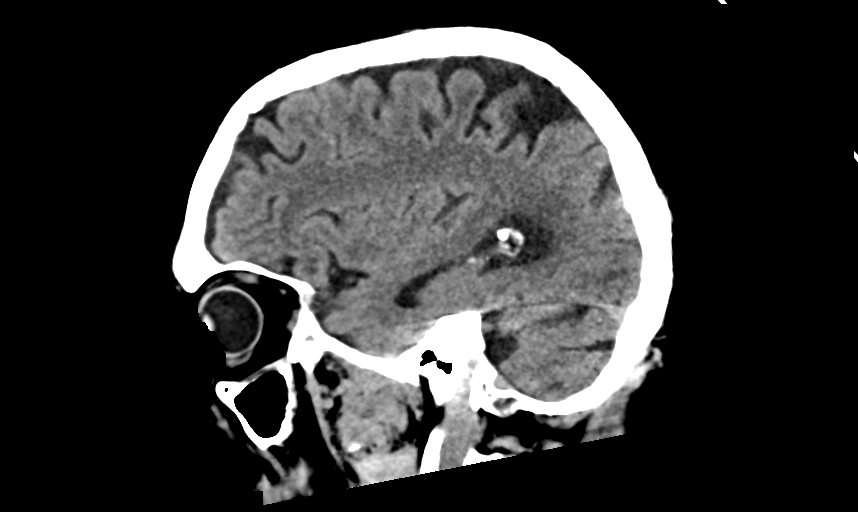

[15 of 47 positions shown; findings below may reference images not displayed]

FINDINGS: Brain: No evidence of acute infarction, hemorrhage, hydrocephalus,
extra-axial collection, or mass lesion/mass effect. Mild-to-moderate
diffuse cerebral atrophy is again seen. Mild chronic small vessel
disease is also stable. Old bilateral basal ganglia lacunar infarcts
again noted.

Vascular:  No hyperdense vessel or other acute findings.

Skull: No evidence of fracture or other significant bone
abnormality.

Sinuses/Orbits:  No acute findings.

Other: None.
IMPRESSION: No acute intracranial abnormality.

Stable cerebral atrophy, chronic small vessel disease, and old
bilateral basal ganglia lacunar infarcts.

## 2022-09-29 IMAGING — US US RENAL
1 series · 14 of 25 positions shown · non-contrast
Comparison: CT 02/28/2019

CLINICAL DATA: Acute renal insufficiency.  Decreased urine output.

EXAM:
RENAL / URINARY TRACT ULTRASOUND COMPLETE

[Series 1: us renal · 14 of 27 slices shown]
[im 1/27]
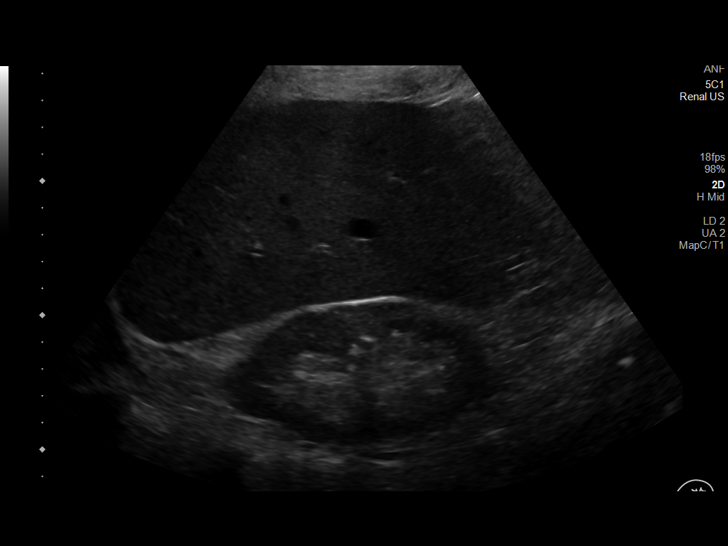
[im 3/27]
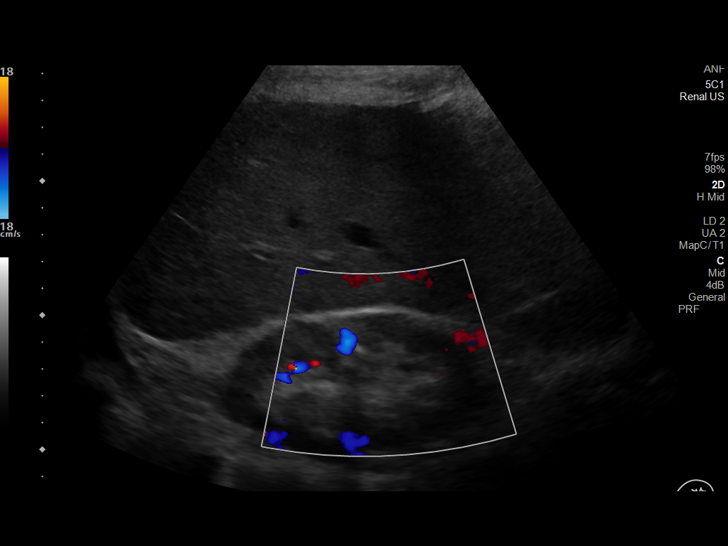
[im 5/27]
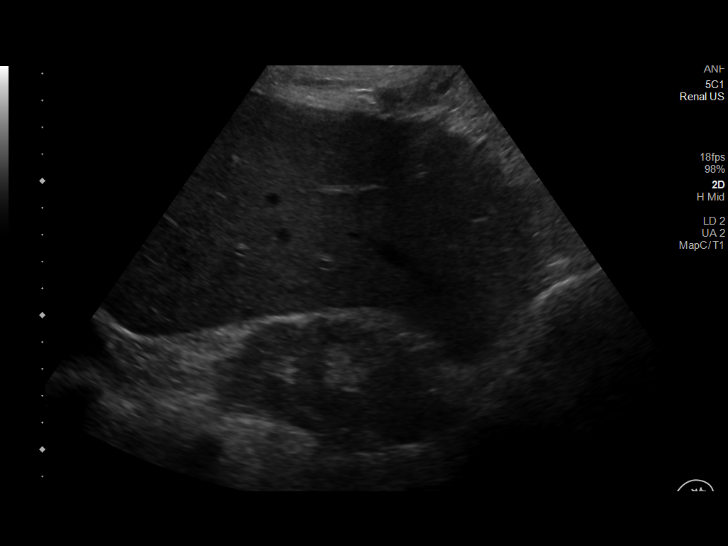
[im 7/27]
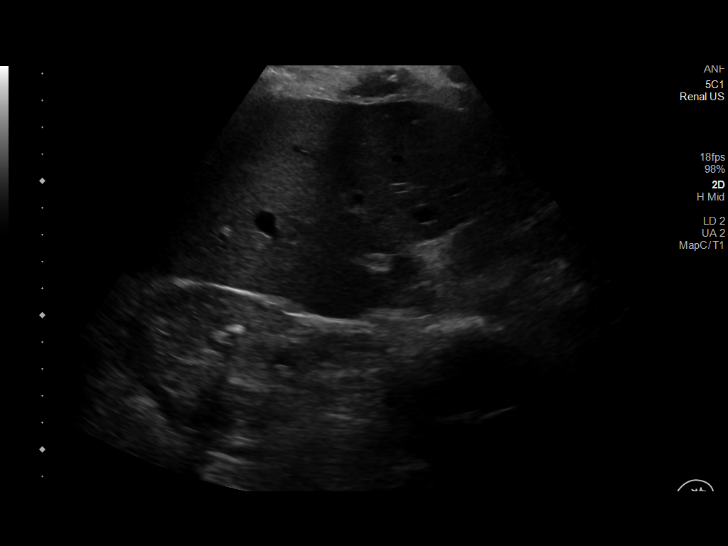
[im 9/27]
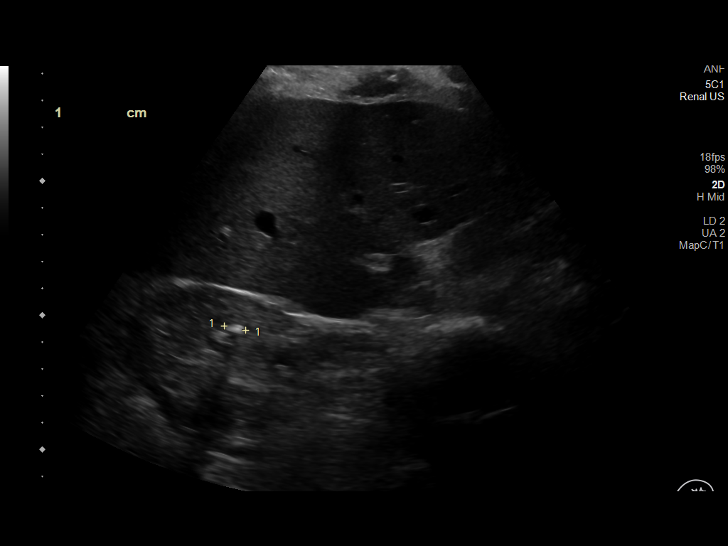
[im 10/27]
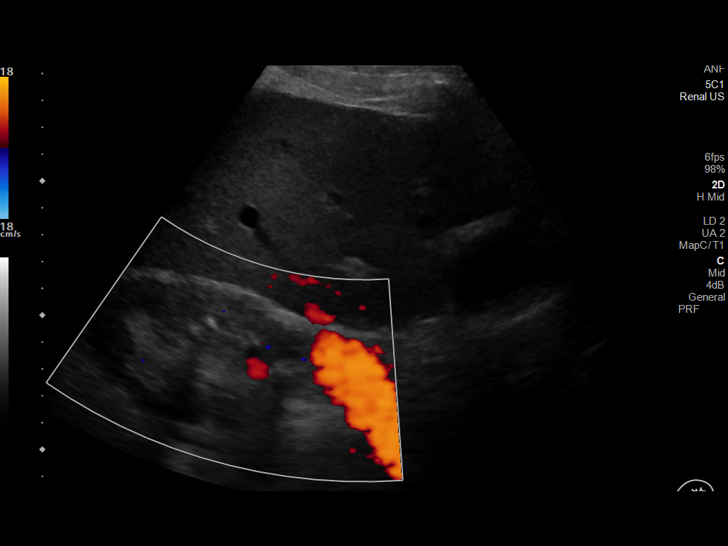
[im 12/27]
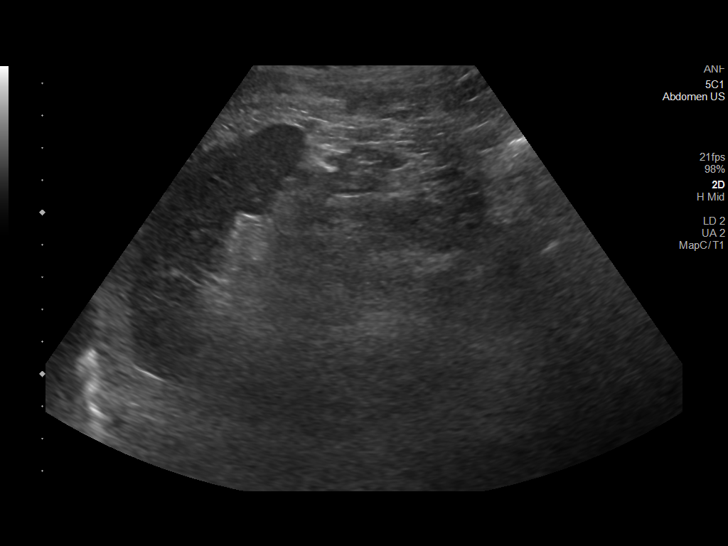
[im 15/27]
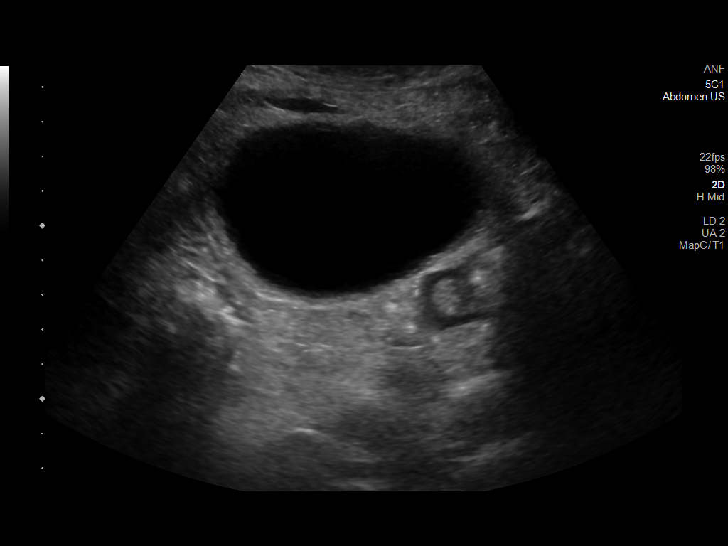
[im 17/27]
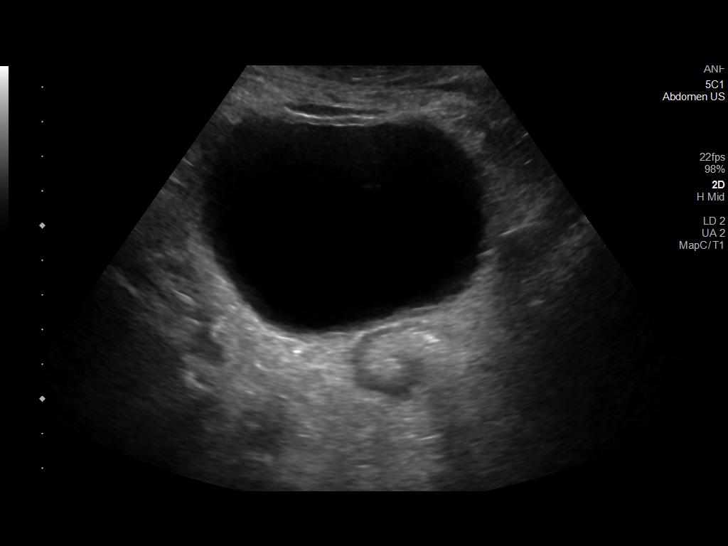
[im 18/27]
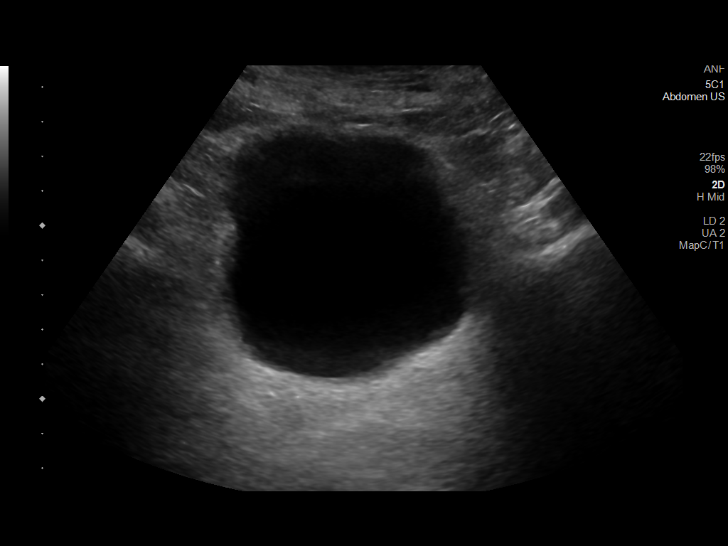
[im 20/27]
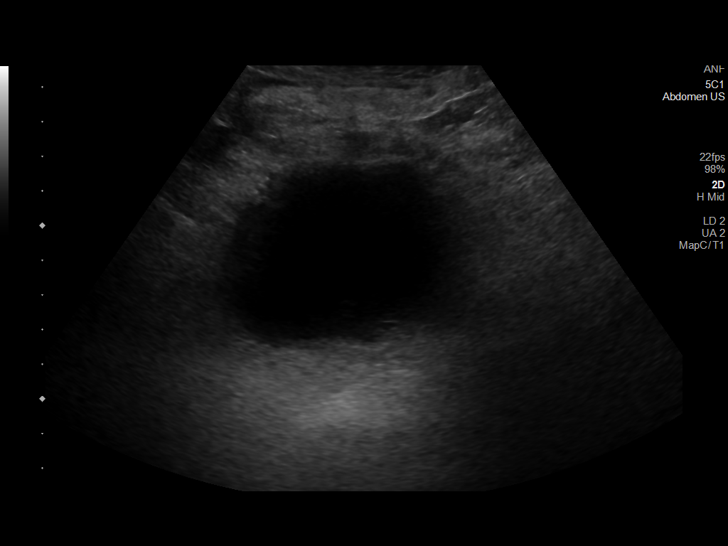
[im 22/27]
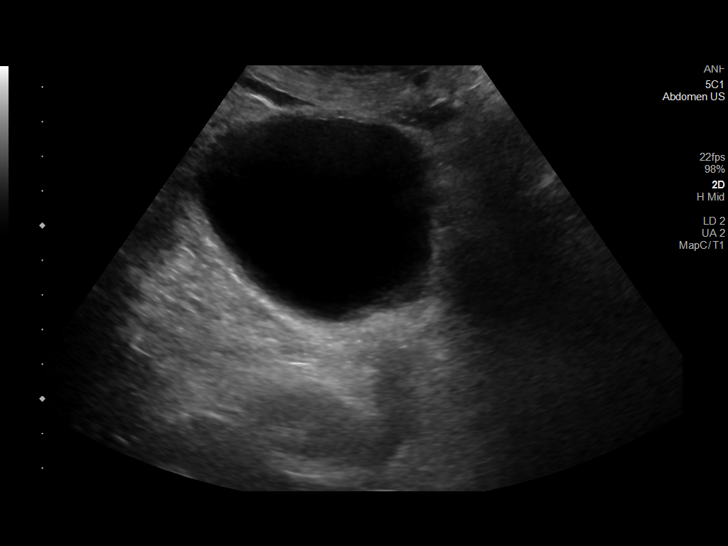
[im 24/27]
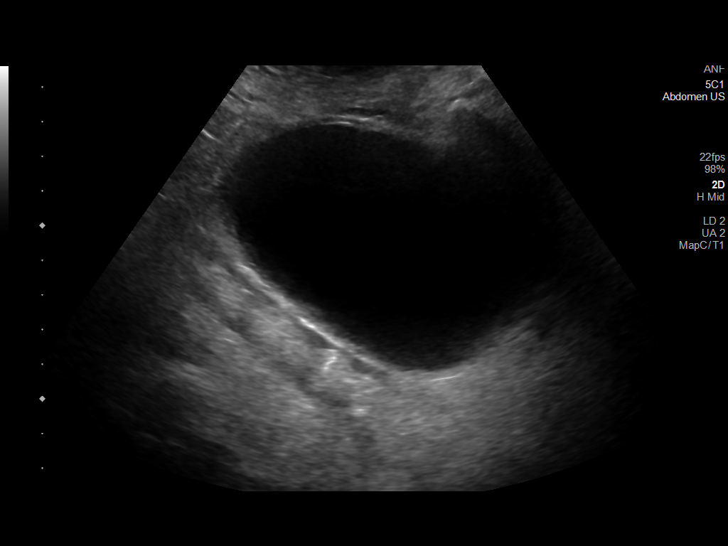
[im 27/27]
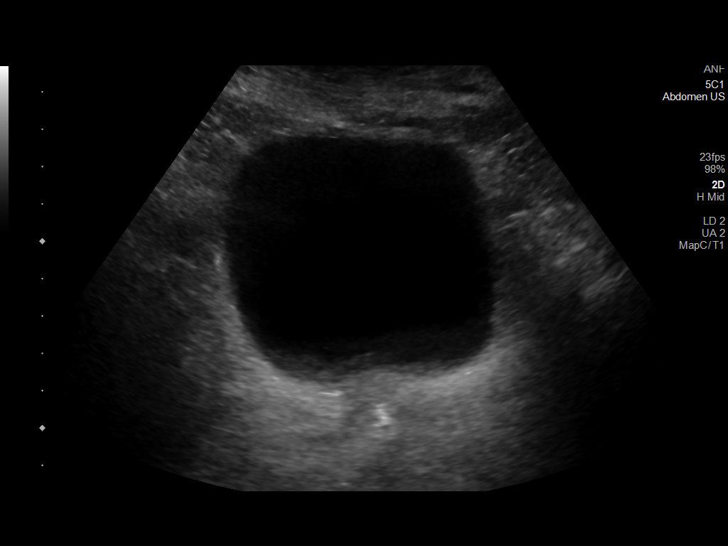

[14 of 25 positions shown; findings below may reference images not displayed]

FINDINGS: Right Kidney:

Renal measurements: 10.7 x 5.2 x 6.0 cm = volume: 77 mL.
Echogenicity within normal limits. No mass or hydronephrosis
visualized. 8 mm shadowing echogenic focus over the upper pole
likely a stone.

Left Kidney:

Not visualized.  Atrophic by previous CT.

Bladder:

Appears normal for degree of bladder distention.

Other:

None.
IMPRESSION: Normal size right kidney with suggestion of 8 mm nonobstructing
upper pole stone. Left kidney not visualized.
